# Patient Record
Sex: Female | Born: 1950
Health system: Southern US, Community
[De-identification: ages and names within clinical notes are randomized; demographics above are authoritative.]

## PROBLEM LIST (undated history)

## (undated) DIAGNOSIS — Z5189 Encounter for other specified aftercare: Secondary | ICD-10-CM

## (undated) DIAGNOSIS — Z86711 Personal history of pulmonary embolism: Secondary | ICD-10-CM

## (undated) DIAGNOSIS — I82409 Acute embolism and thrombosis of unspecified deep veins of unspecified lower extremity: Secondary | ICD-10-CM

## (undated) DIAGNOSIS — Z8719 Personal history of other diseases of the digestive system: Secondary | ICD-10-CM

## (undated) DIAGNOSIS — Z86718 Personal history of other venous thrombosis and embolism: Secondary | ICD-10-CM

## (undated) DIAGNOSIS — N189 Chronic kidney disease, unspecified: Secondary | ICD-10-CM

## (undated) DIAGNOSIS — N201 Calculus of ureter: Secondary | ICD-10-CM

## (undated) DIAGNOSIS — E785 Hyperlipidemia, unspecified: Secondary | ICD-10-CM

## (undated) DIAGNOSIS — Z85828 Personal history of other malignant neoplasm of skin: Secondary | ICD-10-CM

## (undated) DIAGNOSIS — Z86018 Personal history of other benign neoplasm: Secondary | ICD-10-CM

## (undated) DIAGNOSIS — G43909 Migraine, unspecified, not intractable, without status migrainosus: Secondary | ICD-10-CM

## (undated) DIAGNOSIS — Z9889 Other specified postprocedural states: Secondary | ICD-10-CM

## (undated) DIAGNOSIS — T7840XA Allergy, unspecified, initial encounter: Secondary | ICD-10-CM

## (undated) DIAGNOSIS — Z9989 Dependence on other enabling machines and devices: Secondary | ICD-10-CM

## (undated) DIAGNOSIS — K5730 Diverticulosis of large intestine without perforation or abscess without bleeding: Secondary | ICD-10-CM

## (undated) DIAGNOSIS — R112 Nausea with vomiting, unspecified: Secondary | ICD-10-CM

## (undated) DIAGNOSIS — Z973 Presence of spectacles and contact lenses: Secondary | ICD-10-CM

## (undated) DIAGNOSIS — C801 Malignant (primary) neoplasm, unspecified: Secondary | ICD-10-CM

## (undated) DIAGNOSIS — H269 Unspecified cataract: Secondary | ICD-10-CM

## (undated) DIAGNOSIS — G4730 Sleep apnea, unspecified: Secondary | ICD-10-CM

## (undated) DIAGNOSIS — G4733 Obstructive sleep apnea (adult) (pediatric): Secondary | ICD-10-CM

## (undated) DIAGNOSIS — M1990 Unspecified osteoarthritis, unspecified site: Secondary | ICD-10-CM

## (undated) HISTORY — DX: Malignant (primary) neoplasm, unspecified: C80.1

## (undated) HISTORY — DX: Acute embolism and thrombosis of unspecified deep veins of unspecified lower extremity: I82.409

## (undated) HISTORY — DX: Chronic kidney disease, unspecified: N18.9

## (undated) HISTORY — DX: Sleep apnea, unspecified: G47.30

## (undated) HISTORY — DX: Hyperlipidemia, unspecified: E78.5

## (undated) HISTORY — PX: COLONOSCOPY: SHX174

## (undated) HISTORY — DX: Migraine, unspecified, not intractable, without status migrainosus: G43.909

## (undated) HISTORY — DX: Unspecified cataract: H26.9

## (undated) HISTORY — DX: Encounter for other specified aftercare: Z51.89

## (undated) HISTORY — DX: Allergy, unspecified, initial encounter: T78.40XA

## (undated) HISTORY — DX: Personal history of other benign neoplasm: Z86.018

## (undated) HISTORY — PX: INCONTINENCE SURGERY: SHX676

---

## 1983-03-15 HISTORY — PX: HIP PINNING: SHX1757

## 1985-03-14 HISTORY — PX: PARTIAL HIP ARTHROPLASTY: SHX733

## 2002-03-14 HISTORY — PX: OTHER SURGICAL HISTORY: SHX169

## 2003-03-15 HISTORY — PX: VAGINAL HYSTERECTOMY: SUR661

## 2006-05-17 LAB — HM COLONOSCOPY

## 2010-01-12 HISTORY — PX: OTHER SURGICAL HISTORY: SHX169

## 2010-02-11 HISTORY — PX: INSERTION OF VENA CAVA FILTER: SHX5871

## 2010-12-06 HISTORY — PX: OTHER SURGICAL HISTORY: SHX169

## 2010-12-17 ENCOUNTER — Ambulatory Visit: Payer: Self-pay | Admitting: Family Medicine

## 2011-01-10 ENCOUNTER — Encounter: Payer: Self-pay | Admitting: Family Medicine

## 2011-01-10 ENCOUNTER — Ambulatory Visit (INDEPENDENT_AMBULATORY_CARE_PROVIDER_SITE_OTHER): Payer: BC Managed Care – PPO | Admitting: Family Medicine

## 2011-01-10 VITALS — BP 148/96 | HR 80 | Temp 98.6°F | Ht 70.0 in | Wt 210.4 lb

## 2011-01-10 DIAGNOSIS — I82409 Acute embolism and thrombosis of unspecified deep veins of unspecified lower extremity: Secondary | ICD-10-CM

## 2011-01-10 DIAGNOSIS — S2000XA Contusion of breast, unspecified breast, initial encounter: Secondary | ICD-10-CM

## 2011-01-10 DIAGNOSIS — I82403 Acute embolism and thrombosis of unspecified deep veins of lower extremity, bilateral: Secondary | ICD-10-CM | POA: Insufficient documentation

## 2011-01-10 LAB — POCT INR: INR: 3.2

## 2011-01-10 NOTE — Progress Notes (Signed)
  Subjective:    Patient ID: Barbara Thomas, female    DOB: 06-21-1950, 60 y.o.   MRN: [id number]  HPI Pt here to establish.  Pt with hx dvt and hypercoaguability and was told she was to be on coumadin for life.  Pt had a breast biopsy 9/24 on R breast and she had a lot of brusing with it and swelling around bx site.  + bleeding .  Bleeding always stops with pressure but it reoccurs.   Pt was also going to see a vascular surgeon to have filter removed.   Review of Systems    as above Objective:   Physical Exam  Constitutional: She is oriented to person, place, and time. She appears well-developed and well-nourished.  Neck: Normal range of motion. Neck supple.  Cardiovascular: Normal rate, regular rhythm and normal heart sounds.   No murmur heard. Pulmonary/Chest: Effort normal and breath sounds normal. No respiratory distress. She has no wheezes. She has no rales. She exhibits no tenderness.  Genitourinary: There is breast tenderness and bleeding.       + R breast---site of biopsy--oozing an eechymosis  Neurological: She is alert and oriented to person, place, and time.  Psychiatric: She has a normal mood and affect. Her behavior is normal. Judgment and thought content normal.          Assessment & Plan:

## 2011-01-10 NOTE — Assessment & Plan Note (Signed)
At bx site Discussed with [facility]---Dr Yolanda Bonine to see pt today

## 2011-01-10 NOTE — Assessment & Plan Note (Signed)
On coumadin InR 3.2---take 1/2 dose today and then resume Recheck  2 weeks Check f/u US Pt states she needs to see a vascular surgeon to have filter removed---will review records and get [place] first

## 2011-01-10 NOTE — Patient Instructions (Signed)
Dr Blain Pais at [facility] will see you today. Coumadin---take 1/2 tab today and then resume regular dose tomorrow---recheck PT in 2 weeks

## 2011-01-12 ENCOUNTER — Ambulatory Visit
Admission: RE | Admit: 2011-01-12 | Discharge: 2011-01-12 | Disposition: A | Discharge status: Home / Self Care | Payer: BC Managed Care – PPO | Source: Ambulatory Visit | Attending: Family Medicine | Admitting: Family Medicine

## 2011-01-12 DIAGNOSIS — I82403 Acute embolism and thrombosis of unspecified deep veins of lower extremity, bilateral: Secondary | ICD-10-CM

## 2011-01-13 ENCOUNTER — Encounter: Payer: Self-pay | Admitting: Family Medicine

## 2011-01-14 ENCOUNTER — Telehealth: Payer: Self-pay

## 2011-01-14 DIAGNOSIS — I82403 Acute embolism and thrombosis of unspecified deep veins of lower extremity, bilateral: Secondary | ICD-10-CM

## 2011-01-14 NOTE — Telephone Encounter (Signed)
Discussed with patient and made her aware of Dr.Lowne recommendations and she agreed and voiced understanding. Referral put in      [place]

## 2011-01-14 NOTE — Telephone Encounter (Signed)
Message copied by Arnette Norris on Fri Jan 14, 2011 12:02 PM ------      Message from: Lelon Perla      Created: Thu Jan 13, 2011  8:32 AM       Still with B/L DVT---on coumadin----- pt was to see vascular surgeon before moving here----- refer to vascular

## 2011-01-21 ENCOUNTER — Encounter: Payer: Self-pay | Admitting: Vascular Surgery

## 2011-01-24 ENCOUNTER — Ambulatory Visit (INDEPENDENT_AMBULATORY_CARE_PROVIDER_SITE_OTHER): Payer: BC Managed Care – PPO | Admitting: *Deleted

## 2011-01-24 VITALS — BP 110/82 | HR 97 | Wt 205.0 lb

## 2011-01-24 DIAGNOSIS — Z23 Encounter for immunization: Secondary | ICD-10-CM

## 2011-01-24 DIAGNOSIS — Z5181 Encounter for therapeutic drug level monitoring: Secondary | ICD-10-CM

## 2011-01-24 DIAGNOSIS — Z7901 Long term (current) use of anticoagulants: Secondary | ICD-10-CM

## 2011-01-24 NOTE — Patient Instructions (Addendum)
Return to office in 2 weeks for PT/INR  Continue current dosing: 1 tab daily (5mg )

## 2011-01-26 ENCOUNTER — Encounter: Payer: Self-pay | Admitting: Vascular Surgery

## 2011-02-07 ENCOUNTER — Ambulatory Visit (INDEPENDENT_AMBULATORY_CARE_PROVIDER_SITE_OTHER): Payer: BC Managed Care – PPO | Admitting: *Deleted

## 2011-02-07 DIAGNOSIS — Z7901 Long term (current) use of anticoagulants: Secondary | ICD-10-CM

## 2011-02-07 DIAGNOSIS — I82409 Acute embolism and thrombosis of unspecified deep veins of unspecified lower extremity: Secondary | ICD-10-CM

## 2011-02-07 LAB — POCT INR: INR: 3.2

## 2011-02-07 NOTE — Patient Instructions (Signed)
Return to office in 2 weeks   New dosing: 1 tab daily except 1/2 tab on M,W

## 2011-02-10 ENCOUNTER — Ambulatory Visit (INDEPENDENT_AMBULATORY_CARE_PROVIDER_SITE_OTHER): Payer: BC Managed Care – PPO | Admitting: Vascular Surgery

## 2011-02-10 ENCOUNTER — Encounter: Payer: Self-pay | Admitting: Vascular Surgery

## 2011-02-10 VITALS — BP 144/89 | HR 80 | Resp 16 | Ht 71.0 in | Wt 205.0 lb

## 2011-02-10 DIAGNOSIS — M79609 Pain in unspecified limb: Secondary | ICD-10-CM

## 2011-02-10 DIAGNOSIS — I824Z9 Acute embolism and thrombosis of unspecified deep veins of unspecified distal lower extremity: Secondary | ICD-10-CM

## 2011-02-10 NOTE — Progress Notes (Signed)
[facility]   Reason for referral: Swollen bilateral legs  History of Present Illness  Barbara Thomas is a 60 y.o. female who presents for evaluation of bilateral lower extremity DVT. She also is here to consider whether or not an IVC filter should be removed. The patient's history begins in November of 2012 4 she had a left ankle and right foot fracture. Subsequently she had a pulmonary embolus approximately one month later. She was noted at that time to have a lupus anticoagulant and placed on Coumadin. And he clips IVC filter was also placed at that time. This was all done in [place], [place]. Records were unavailable for review today. The patient continues to take her Coumadin daily and is followed by her primary care physician. She does have chronic swelling in both lower extremities but states that the leg circumference has essentially return to her baseline prior to her DVTs. She had a recent duplex ultrasound which showed DVT in both lower extremities. The images are not available for review but I would suspect this point that most likely this is chronic adherent clot. She also complains of put pain over her left fifth toe. She also has some complaints of pain in her left hip has had previous hip replacement.  The patient also complains of engorged veins in both feet as well as a bluish discoloration of her left foot. This is all chronic.  Past Medical History  Diagnosis Date  . Migraines   . DVT (deep venous thrombosis)     Past Surgical History  Procedure Date  . Breast biopsy   . Vaginal hysterectomy   . Skin cancer excision     ON NOSE  . Foot surgery   . Hip pinning   . Bladder suspension   . Vein surgery 02/2010    ECLIPSE FILTER INSERTED     History   Social History  . Marital Status: Married    Spouse Name: N/A    Number of Children: N/A  . Years of Education: N/A   Occupational History  . Not on file.   Social History Main Topics  .  Smoking status: Never Smoker   . Smokeless tobacco: Never Used  . Alcohol Use: No  . Drug Use: No  . Sexually Active: Not on file   Other Topics Concern  . Not on file   Social History Narrative  . No narrative on file    Family History  Problem Relation Age of Onset  . Transient ischemic attack Mother   . Cancer Father     BLADDER CANCER  . Hypertension Mother   . Hypertension Father   . Dementia Mother   . Stroke Paternal Grandmother   . Sudden death Maternal Grandmother   . Diabetes Brother     Current Outpatient Prescriptions on File Prior to Visit  Medication Sig Dispense Refill  . alendronate (FOSAMAX) 70 MG tablet Take 70 mg by mouth every 7 (seven) days. Take with a full glass of water on an empty stomach.       . calcium carbonate (OS-CAL) 600 MG TABS Take 600 mg by mouth 2 (two) times daily with a meal.        . fluticasone (FLONASE) 50 MCG/ACT nasal spray Place 2 sprays into the nose daily.        Marland Kitchen HYDROcodone-acetaminophen (VICODIN) 5-500 MG per tablet Take 1 tablet by mouth every 6 (six) hours as needed.        Marland Kitchen  Multiple Vitamin (MULTIVITAMIN) tablet Take 1 tablet by mouth daily.        . pantoprazole (PROTONIX) 40 MG tablet Take 40 mg by mouth daily.        . Vitamin D, Ergocalciferol, (DRISDOL) 50000 UNITS CAPS Take 50,000 Units by mouth.        . warfarin (COUMADIN) 5 MG tablet Take 5 mg by mouth daily.          Allergies as of 02/10/2011 - Review Complete 02/10/2011  Allergen Reaction Noted  . Penicillins Rash 01/10/2011     ROS:   General:  No weight loss, Fever, chills  HEENT: No recent headaches, no nasal bleeding, no visual changes, no sore throat  Neurologic: No dizziness, blackouts, seizures. No recent symptoms of stroke or mini- stroke. No recent episodes of slurred speech, or temporary blindness.  Cardiac: No recent episodes of chest pain/pressure, no shortness of breath at rest.  No shortness of breath with exertion.  Denies history of  atrial fibrillation or irregular heartbeat  Vascular: No history of rest pain in feet.  No history of claudication.  No history of non-healing ulcer  Pulmonary: No home oxygen, no productive cough, no hemoptysis,  No asthma or wheezing  Musculoskeletal:  ,  [ ]  Joint pain as above  Hematologic:history of hypercoagulable state  Gastrointestinal: No hematochezia or melena,  No gastroesophageal reflux, no trouble swallowing  Urinary: [ ]  chronic Kidney disease, [ ]  on HD - [ ]  MWF or [ ]  TTHS, [ ]  Burning with urination, [ ]  Frequent urination, [ ]  Difficulty urinating;   Skin: No rashes  Psychological: No history of anxiety,  No history of depression  Physical Examination  Filed Vitals:   02/10/11 1026  BP: 144/89  Pulse: 80  Resp: 16  Height: 5\' 11"  (1.803 m)  Weight: 205 lb (92.987 kg)  SpO2: 100%    Body mass index is 28.59 kg/(m^2).  General:  Alert and oriented, no acute distress HEENT: Normal Neck: No bruit or JVD Pulmonary: Clear to auscultation bilaterally Cardiac: Regular Rate and Rhythm without murmur Abdomen: Soft, non-tender, non-distended, no mass, no scars, obese Skin: No rash, dusky to bluish discoloration of left foot from the ankle down. Large varicosities both feet 3-4 mm in diameter, no ulcers Extremity Pulses:  2+ radial, brachial, femoral, dorsalis pedis, posterior tibial pulses bilaterally Musculoskeletal: No deformity or edema, tender to palpation over the left fifth metatarsal head Neurologic: Upper and lower extremity motor 5/5 and symmetric   Assessment: Patient with a history of lupus anticoagulant now on chronic Coumadin therapy. She also has a history of IVC filter placement. She has symptoms now of postphlebitic syndrome.  She also has pain in her left foot and left hip which is probably orthopedic in nature. No evidence of arterial disease.  Plan: #1 the patient will continue her Coumadin therapy lifelong  #2 I would not recommend  removing her inferior vena cava filter should she does still have evidence of thrombus in her veins. However, this most likely represents chronic adherent thrombus and should be low risk overall. However I do not believe the benefits of removing the filter out ways the risk of removing the filter as well as the protective effect of the filter in her risk long term of recurrent DVT. All this was discussed with the patient today.  #3 the patient currently wears compression stockings and have continued to advise her that she should wear these lifelong and replace them when they  become loose  #4 we will obtain a left foot x-ray in left hip x-ray to evaluate her hip and foot pain. I've also referred her to Dr. Allie Bossier for further evaluation and long-term followup of her orthopedic situation since she has now moved here from [place], [place], MD [facility] Office: [phone] Pager: [phone]

## 2011-02-22 ENCOUNTER — Ambulatory Visit (INDEPENDENT_AMBULATORY_CARE_PROVIDER_SITE_OTHER): Payer: BC Managed Care – PPO | Admitting: *Deleted

## 2011-02-22 ENCOUNTER — Telehealth: Payer: Self-pay | Admitting: *Deleted

## 2011-02-22 DIAGNOSIS — Z7901 Long term (current) use of anticoagulants: Secondary | ICD-10-CM

## 2011-02-22 DIAGNOSIS — I82409 Acute embolism and thrombosis of unspecified deep veins of unspecified lower extremity: Secondary | ICD-10-CM

## 2011-02-22 MED ORDER — WARFARIN SODIUM 5 MG PO TABS: 5.0000 mg | ORAL_TABLET | Freq: Every day | ORAL | Status: DC

## 2011-02-22 NOTE — Telephone Encounter (Signed)
Discussed with patient and she just wanted to go in an have her labs done. I explained the est way ans most accurate to do it is in the office because outside of the office will put her at an increased risk if the results don't came back in time and if they are abnormal we needs to address it asap. Patient voiced understanding and stated she would continue to come in or her PT/INR checks.      KP

## 2011-02-22 NOTE — Telephone Encounter (Signed)
Yes ---she just needs to call and establish care there---they are [facility] but not [facility]--- so she would be a new patient.

## 2011-02-22 NOTE — Telephone Encounter (Signed)
Pt came in for a PT/INR check, she advised that is far for her to drive here is there anyway she can go to the [place] office?

## 2011-02-22 NOTE — Patient Instructions (Signed)
PT/INR today 3.1 Take 2.5MG  today Start taking 5MG  everyday except on Tuesdays Re-check in 2 weeks

## 2011-02-22 NOTE — Progress Notes (Signed)
PT/INR 3.1 Changed to take 2.5mg  today and every Tuesday Take 5mg  every day except Tuesday Re-check in 2 weeks

## 2011-02-25 ENCOUNTER — Encounter: Payer: Self-pay | Admitting: Family Medicine

## 2011-03-09 ENCOUNTER — Ambulatory Visit (INDEPENDENT_AMBULATORY_CARE_PROVIDER_SITE_OTHER): Payer: BC Managed Care – PPO | Admitting: *Deleted

## 2011-03-09 DIAGNOSIS — Z5181 Encounter for therapeutic drug level monitoring: Secondary | ICD-10-CM

## 2011-03-09 DIAGNOSIS — Z7901 Long term (current) use of anticoagulants: Secondary | ICD-10-CM

## 2011-03-09 LAB — POCT INR: INR: 1.8

## 2011-03-09 NOTE — Patient Instructions (Addendum)
No changes made due to missed dose on 12.24.12. On tuesdays take 2.5mg  [0.5 tablet], every day except Tuesday take 5mg  [1 tablet]. Repeat INR/PT check in [2] weeks per Dr. Beverely Low.

## 2011-03-23 ENCOUNTER — Ambulatory Visit (INDEPENDENT_AMBULATORY_CARE_PROVIDER_SITE_OTHER): Payer: BC Managed Care – PPO

## 2011-03-23 VITALS — BP 118/70 | HR 97 | Temp 97.7°F

## 2011-03-23 DIAGNOSIS — Z7901 Long term (current) use of anticoagulants: Secondary | ICD-10-CM

## 2011-03-23 LAB — POCT INR: INR: 2.6

## 2011-03-23 NOTE — Patient Instructions (Signed)
No change, continue to take 5 mg (1 tab) daily except on tuesdays take 2.5 mg (half tab).   KP

## 2011-03-30 ENCOUNTER — Telehealth: Payer: Self-pay | Admitting: Family Medicine

## 2011-03-30 MED ORDER — ALENDRONATE SODIUM 70 MG PO TABS: 70.0000 mg | ORAL_TABLET | ORAL | Status: DC

## 2011-03-30 NOTE — Telephone Encounter (Signed)
Rx sent 

## 2011-03-30 NOTE — Telephone Encounter (Signed)
The pt called and is requesting a refill of Fosamax 70mg  sent to [facility]!

## 2011-04-06 ENCOUNTER — Ambulatory Visit (INDEPENDENT_AMBULATORY_CARE_PROVIDER_SITE_OTHER): Payer: BC Managed Care – PPO | Admitting: *Deleted

## 2011-04-06 DIAGNOSIS — I82409 Acute embolism and thrombosis of unspecified deep veins of unspecified lower extremity: Secondary | ICD-10-CM

## 2011-04-06 DIAGNOSIS — Z7901 Long term (current) use of anticoagulants: Secondary | ICD-10-CM

## 2011-04-06 NOTE — Patient Instructions (Signed)
Return to office in 4 weeks  Continue current dose:  5 mg (1 tab) daily except tuesdays 2.5 mg (1/2 tab). 

## 2011-04-25 ENCOUNTER — Other Ambulatory Visit: Payer: Self-pay | Admitting: Family Medicine

## 2011-04-27 ENCOUNTER — Other Ambulatory Visit: Payer: Self-pay | Admitting: Family Medicine

## 2011-04-27 MED ORDER — VITAMIN D (ERGOCALCIFEROL) 1.25 MG (50000 UNIT) PO CAPS: 50000.0000 [IU] | ORAL_CAPSULE | ORAL | Status: DC

## 2011-04-27 NOTE — Telephone Encounter (Signed)
cpe--scheduled 06/03/11--   KP

## 2011-05-09 ENCOUNTER — Ambulatory Visit (INDEPENDENT_AMBULATORY_CARE_PROVIDER_SITE_OTHER): Payer: BC Managed Care – PPO | Admitting: *Deleted

## 2011-05-09 DIAGNOSIS — Z7901 Long term (current) use of anticoagulants: Secondary | ICD-10-CM

## 2011-05-09 DIAGNOSIS — I82409 Acute embolism and thrombosis of unspecified deep veins of unspecified lower extremity: Secondary | ICD-10-CM

## 2011-05-09 NOTE — Patient Instructions (Signed)
Return to office in 4 weeks  Continue current dose:  5 mg (1 tab) daily except tuesdays 2.5 mg (1/2 tab).

## 2011-05-23 ENCOUNTER — Other Ambulatory Visit: Payer: Self-pay | Admitting: Family Medicine

## 2011-06-03 ENCOUNTER — Ambulatory Visit (INDEPENDENT_AMBULATORY_CARE_PROVIDER_SITE_OTHER): Payer: BC Managed Care – PPO | Admitting: Family Medicine

## 2011-06-03 ENCOUNTER — Encounter: Payer: Self-pay | Admitting: Family Medicine

## 2011-06-03 VITALS — BP 120/72 | HR 91 | Temp 97.9°F | Ht 70.25 in | Wt 209.4 lb

## 2011-06-03 DIAGNOSIS — Z780 Asymptomatic menopausal state: Secondary | ICD-10-CM

## 2011-06-03 DIAGNOSIS — I82409 Acute embolism and thrombosis of unspecified deep veins of unspecified lower extremity: Secondary | ICD-10-CM

## 2011-06-03 DIAGNOSIS — J40 Bronchitis, not specified as acute or chronic: Secondary | ICD-10-CM

## 2011-06-03 DIAGNOSIS — K219 Gastro-esophageal reflux disease without esophagitis: Secondary | ICD-10-CM

## 2011-06-03 DIAGNOSIS — Z0000 Encounter for general adult medical examination without abnormal findings: Secondary | ICD-10-CM

## 2011-06-03 DIAGNOSIS — R319 Hematuria, unspecified: Secondary | ICD-10-CM

## 2011-06-03 LAB — LIPID PANEL
Cholesterol: 226 mg/dL — ABNORMAL HIGH (ref 0–200)
HDL: 52.8 mg/dL (ref >39.00)
Triglycerides: 99 mg/dL (ref 0.0–149.0)
VLDL: 19.8 mg/dL (ref 0.0–40.0)

## 2011-06-03 LAB — BASIC METABOLIC PANEL
BUN: 18 mg/dL (ref 6–23)
Calcium: 9.2 mg/dL (ref 8.4–10.5)
Creatinine, Ser: 0.8 mg/dL (ref 0.4–1.2)
GFR: 76.77 mL/min (ref >60.00)
Glucose, Bld: 113 mg/dL — ABNORMAL HIGH (ref 70–99)
Sodium: 138 mEq/L (ref 135–145)

## 2011-06-03 LAB — TSH: TSH: 1.25 u[IU]/mL (ref 0.35–5.50)

## 2011-06-03 LAB — CBC WITH DIFFERENTIAL/PLATELET
Eosinophils Absolute: 0.1 10*3/uL (ref 0.0–0.7)
HCT: 41.8 % (ref 36.0–46.0)
Lymphs Abs: 1.4 10*3/uL (ref 0.7–4.0)
MCHC: 34 g/dL (ref 30.0–36.0)
MCV: 84.7 fl (ref 78.0–100.0)
Monocytes Absolute: 0.6 10*3/uL (ref 0.1–1.0)
Neutrophils Relative %: 64.9 % (ref 43.0–77.0)
Platelets: 206 10*3/uL (ref 150.0–400.0)

## 2011-06-03 LAB — POCT URINALYSIS DIPSTICK
Protein, UA: NEGATIVE
Spec Grav, UA: >=1.03
Urobilinogen, UA: 0.2
pH, UA: 6

## 2011-06-03 LAB — LDL CHOLESTEROL, DIRECT: Direct LDL: 163.8 mg/dL

## 2011-06-03 LAB — HEPATIC FUNCTION PANEL
Albumin: 4.3 g/dL (ref 3.5–5.2)
Alkaline Phosphatase: 84 U/L (ref 39–117)

## 2011-06-03 LAB — POCT INR: INR: 2.3

## 2011-06-03 MED ORDER — AZITHROMYCIN 250 MG PO TABS
ORAL_TABLET | ORAL | Status: AC
Start: 2011-06-03 — End: 2011-06-08

## 2011-06-03 MED ORDER — FLUTICASONE-SALMETEROL 250-50 MCG/DOSE IN AEPB: INHALATION_SPRAY | RESPIRATORY_TRACT | Status: DC

## 2011-06-03 NOTE — Progress Notes (Signed)
Subjective:     Barbara Thomas is a 61 y.o. female and is here for a comprehensive physical exam. The patient reports problems - + congestion for 4-5 days.  History   Social History  . Marital Status: Married    Spouse Name: N/A    Number of Children: N/A  . Years of Education: N/A   Occupational History  . housewife    Social History Main Topics  . Smoking status: Never Smoker   . Smokeless tobacco: Never Used  . Alcohol Use: No  . Drug Use: No  . Sexually Active: Yes -- Female partner(s)   Other Topics Concern  . Not on file   Social History Narrative   Exercise--- walking--- qd short distance   Health Maintenance  Topic Date Due  . Tetanus/tdap  11/08/1970  . Mammogram  11/07/2001  . Influenza Vaccine  12/13/2011  . Colonoscopy  06/02/2013  . Pap Smear  06/03/2014    The following portions of the patient's history were reviewed and updated as appropriate: allergies, current medications, past family history, past medical history, past social history, past surgical history and problem list.  Review of Systems Review of Systems  Constitutional: Negative for activity change, appetite change and fatigue.  HENT: Negative for hearing loss, , tinnitus and ear discharge.  dentist-- due  + congestion Eyes: Negative for visual disturbance (see optho q2y -- vision corrected to 20/20 with glasses).  Respiratory: Negative for cough, chest tightness and shortness of breath.   Cardiovascular: Negative for chest pain, palpitations and leg swelling.  Gastrointestinal: Negative for abdominal pain, diarrhea, constipation and abdominal distention.  Genitourinary: Negative for urgency, frequency, decreased urine volume and difficulty urinating.  Musculoskeletal: Negative for back pain, arthralgias and gait problem.  Skin: Negative for color change, pallor and rash.  Neurological: Negative for dizziness, light-headedness, numbness and headaches.  Hematological: Negative for adenopathy. Does  not bruise/bleed easily.  Psychiatric/Behavioral: Negative for suicidal ideas, confusion, sleep disturbance, self-injury, dysphoric mood, decreased concentration and agitation.       Objective:    BP 120/72  Pulse 91  Temp(Src) 97.9 F (36.6 C) (Oral)  Ht 5' 10.25" (1.784 m)  Wt 209 lb 6.4 oz (94.983 kg)  BMI 29.83 kg/m2  SpO2 95% General appearance: alert, cooperative, appears stated age and no distress Head: Normocephalic, without obvious abnormality, atraumatic Eyes: negative findings: lids and lashes normal, conjunctivae/corneas clear. PERRL, EOM&#39;s intact. Fundi benign. Ears: normal TM's and external ear canals both ears Nose: Nares normal. Septum midline. Mucosa normal. No drainage or sinus tenderness. Throat: lips, mucosa, and tongue normal; teeth and gums normal Neck: no adenopathy, no carotid bruit, supple, symmetrical, trachea midline and thyroid not enlarged, symmetric, no tenderness/mass/nodules Back: no skin lesions, erythema, or scars, symmetric, no curvature. ROM normal. No CVA tenderness. Lungs: clear to auscultation bilaterally Breasts: normal appearance, no masses or tenderness Heart: regular rate and rhythm, S1, S2 normal, no murmur, click, rub or gallop Abdomen: soft, non-tender; bowel sounds normal; no masses,  no organomegaly Pelvic: not indicated; post-menopausal, no abnormal Pap smears in past Extremities: extremities normal, atraumatic, no cyanosis or edema Pulses: 2+ and symmetric Skin: Skin color, texture, turgor normal. No rashes or lesions Lymph nodes: Cervical, supraclavicular, and axillary nodes normal. Neurologic: Alert and oriented X 3, normal strength and tone. Normal symmetric reflexes. Normal coordination and gait     Assessment:    Healthy female exam.  GERD   DVT-- on coumadin Bronchitis---z pack,  advair 250 1 inh bid,  con't tussonex and antihistamine   Plan:    ghm utd Check labs Check mammo/ bmd See After Visit Summary for  Counseling Recommendations

## 2011-06-03 NOTE — Patient Instructions (Signed)
Preventive Care for Adults, Female A healthy lifestyle and preventive care can promote health and wellness. Preventive health guidelines for women include the following key practices.  A routine yearly physical is a good way to check with your caregiver about your health and preventive screening. It is a chance to share any concerns and updates on your health, and to receive a thorough exam.   Visit your dentist for a routine exam and preventive care every 6 months. Brush your teeth twice a day and floss once a day. Good oral hygiene prevents tooth decay and gum disease.   The frequency of eye exams is based on your age, health, family medical history, use of contact lenses, and other factors. Follow your caregiver's recommendations for frequency of eye exams.   Eat a healthy diet. Foods like vegetables, fruits, whole grains, low-fat dairy products, and lean protein foods contain the nutrients you need without too many calories. Decrease your intake of foods high in solid fats, added sugars, and salt. Eat the right amount of calories for you.Get information about a proper diet from your caregiver, if necessary.   Regular physical exercise is one of the most important things you can do for your health. Most adults should get at least 150 minutes of moderate-intensity exercise (any activity that increases your heart rate and causes you to sweat) each week. In addition, most adults need muscle-strengthening exercises on 2 or more days a week.   Maintain a healthy weight. The body mass index (BMI) is a screening tool to identify possible weight problems. It provides an estimate of body fat based on height and weight. Your caregiver can help determine your BMI, and can help you achieve or maintain a healthy weight.For adults 20 years and older:   A BMI below 18.5 is considered underweight.   A BMI of 18.5 to 24.9 is normal.   A BMI of 25 to 29.9 is considered overweight.   A BMI of 30 and above is  considered obese.   Maintain normal blood lipids and cholesterol levels by exercising and minimizing your intake of saturated fat. Eat a balanced diet with plenty of fruit and vegetables. Blood tests for lipids and cholesterol should begin at age 20 and be repeated every 5 years. If your lipid or cholesterol levels are high, you are over 50, or you are at high risk for heart disease, you may need your cholesterol levels checked more frequently.Ongoing high lipid and cholesterol levels should be treated with medicines if diet and exercise are not effective.   If you smoke, find out from your caregiver how to quit. If you do not use tobacco, do not start.   If you are pregnant, do not drink alcohol. If you are breastfeeding, be very cautious about drinking alcohol. If you are not pregnant and choose to drink alcohol, do not exceed 1 drink per day. One drink is considered to be 12 ounces (355 mL) of beer, 5 ounces (148 mL) of wine, or 1.5 ounces (44 mL) of liquor.   Avoid use of street drugs. Do not share needles with anyone. Ask for help if you need support or instructions about stopping the use of drugs.   High blood pressure causes heart disease and increases the risk of stroke. Your blood pressure should be checked at least every 1 to 2 years. Ongoing high blood pressure should be treated with medicines if weight loss and exercise are not effective.   If you are 55 to 60   years old, ask your caregiver if you should take aspirin to prevent strokes.   Diabetes screening involves taking a blood sample to check your fasting blood sugar level. This should be done once every 3 years, after age 45, if you are within normal weight and without risk factors for diabetes. Testing should be considered at a younger age or be carried out more frequently if you are overweight and have at least 1 risk factor for diabetes.   Breast cancer screening is essential preventive care for women. You should practice "breast  self-awareness." This means understanding the normal appearance and feel of your breasts and may include breast self-examination. Any changes detected, no matter how small, should be reported to a caregiver. Women in their 20s and 30s should have a clinical breast exam (CBE) by a caregiver as part of a regular health exam every 1 to 3 years. After age 40, women should have a CBE every year. Starting at age 40, women should consider having a mammography (breast X-ray test) every year. Women who have a family history of breast cancer should talk to their caregiver about genetic screening. Women at a high risk of breast cancer should talk to their caregivers about having magnetic resonance imaging (MRI) and a mammography every year.   The Pap test is a screening test for cervical cancer. A Pap test can show cell changes on the cervix that might become cervical cancer if left untreated. A Pap test is a procedure in which cells are obtained and examined from the lower end of the uterus (cervix).   Women should have a Pap test starting at age 21.   Between ages 21 and 29, Pap tests should be repeated every 2 years.   Beginning at age 30, you should have a Pap test every 3 years as long as the past 3 Pap tests have been normal.   Some women have medical problems that increase the chance of getting cervical cancer. Talk to your caregiver about these problems. It is especially important to talk to your caregiver if a new problem develops soon after your last Pap test. In these cases, your caregiver may recommend more frequent screening and Pap tests.   The above recommendations are the same for women who have or have not gotten the vaccine for human papillomavirus (HPV).   If you had a hysterectomy for a problem that was not cancer or a condition that could lead to cancer, then you no longer need Pap tests. Even if you no longer need a Pap test, a regular exam is a good idea to make sure no other problems are  starting.   If you are between ages 65 and 70, and you have had normal Pap tests going back 10 years, you no longer need Pap tests. Even if you no longer need a Pap test, a regular exam is a good idea to make sure no other problems are starting.   If you have had past treatment for cervical cancer or a condition that could lead to cancer, you need Pap tests and screening for cancer for at least 20 years after your treatment.   If Pap tests have been discontinued, risk factors (such as a new sexual partner) need to be reassessed to determine if screening should be resumed.   The HPV test is an additional test that may be used for cervical cancer screening. The HPV test looks for the virus that can cause the cell changes on the cervix.   The cells collected during the Pap test can be tested for HPV. The HPV test could be used to screen women aged 30 years and older, and should be used in women of any age who have unclear Pap test results. After the age of 30, women should have HPV testing at the same frequency as a Pap test.   Colorectal cancer can be detected and often prevented. Most routine colorectal cancer screening begins at the age of 50 and continues through age 75. However, your caregiver may recommend screening at an earlier age if you have risk factors for colon cancer. On a yearly basis, your caregiver may provide home test kits to check for hidden blood in the stool. Use of a small camera at the end of a tube, to directly examine the colon (sigmoidoscopy or colonoscopy), can detect the earliest forms of colorectal cancer. Talk to your caregiver about this at age 50, when routine screening begins. Direct examination of the colon should be repeated every 5 to 10 years through age 75, unless early forms of pre-cancerous polyps or small growths are found.   Hepatitis C blood testing is recommended for all people born from 1945 through 1965 and any individual with known risks for hepatitis C.    Practice safe sex. Use condoms and avoid high-risk sexual practices to reduce the spread of sexually transmitted infections (STIs). STIs include gonorrhea, chlamydia, syphilis, trichomonas, herpes, HPV, and human immunodeficiency virus (HIV). Herpes, HIV, and HPV are viral illnesses that have no cure. They can result in disability, cancer, and death. Sexually active women aged 25 and younger should be checked for chlamydia. Older women with new or multiple partners should also be tested for chlamydia. Testing for other STIs is recommended if you are sexually active and at increased risk.   Osteoporosis is a disease in which the bones lose minerals and strength with aging. This can result in serious bone fractures. The risk of osteoporosis can be identified using a bone density scan. Women ages 65 and over and women at risk for fractures or osteoporosis should discuss screening with their caregivers. Ask your caregiver whether you should take a calcium supplement or vitamin D to reduce the rate of osteoporosis.   Menopause can be associated with physical symptoms and risks. Hormone replacement therapy is available to decrease symptoms and risks. You should talk to your caregiver about whether hormone replacement therapy is right for you.   Use sunscreen with sun protection factor (SPF) of 30 or more. Apply sunscreen liberally and repeatedly throughout the day. You should seek shade when your shadow is shorter than you. Protect yourself by wearing long sleeves, pants, a wide-brimmed hat, and sunglasses year round, whenever you are outdoors.   Once a month, do a whole body skin exam, using a mirror to look at the skin on your back. Notify your caregiver of new moles, moles that have irregular borders, moles that are larger than a pencil eraser, or moles that have changed in shape or color.   Stay current with required immunizations.   Influenza. You need a dose every fall (or winter). The composition of  the flu vaccine changes each year, so being vaccinated once is not enough.   Pneumococcal polysaccharide. You need 1 to 2 doses if you smoke cigarettes or if you have certain chronic medical conditions. You need 1 dose at age 65 (or older) if you have never been vaccinated.   Tetanus, diphtheria, pertussis (Tdap, Td). Get 1 dose of   Tdap vaccine if you are younger than age 65, are over 65 and have contact with an infant, are a healthcare worker, are pregnant, or simply want to be protected from whooping cough. After that, you need a Td booster dose every 10 years. Consult your caregiver if you have not had at least 3 tetanus and diphtheria-containing shots sometime in your life or have a deep or dirty wound.   HPV. You need this vaccine if you are a woman age 26 or younger. The vaccine is given in 3 doses over 6 months.   Measles, mumps, rubella (MMR). You need at least 1 dose of MMR if you were born in 1957 or later. You may also need a second dose.   Meningococcal. If you are age 19 to 21 and a first-year college student living in a residence hall, or have one of several medical conditions, you need to get vaccinated against meningococcal disease. You may also need additional booster doses.   Zoster (shingles). If you are age 60 or older, you should get this vaccine.   Varicella (chickenpox). If you have never had chickenpox or you were vaccinated but received only 1 dose, talk to your caregiver to find out if you need this vaccine.   Hepatitis A. You need this vaccine if you have a specific risk factor for hepatitis A virus infection or you simply wish to be protected from this disease. The vaccine is usually given as 2 doses, 6 to 18 months apart.   Hepatitis B. You need this vaccine if you have a specific risk factor for hepatitis B virus infection or you simply wish to be protected from this disease. The vaccine is given in 3 doses, usually over 6 months.  Preventive Services /  Frequency Ages 19 to 39  Blood pressure check.** / Every 1 to 2 years.   Lipid and cholesterol check.** / Every 5 years beginning at age 20.   Clinical breast exam.** / Every 3 years for women in their 20s and 30s.   Pap test.** / Every 2 years from ages 21 through 29. Every 3 years starting at age 30 through age 65 or 70 with a history of 3 consecutive normal Pap tests.   HPV screening.** / Every 3 years from ages 30 through ages 65 to 70 with a history of 3 consecutive normal Pap tests.   Hepatitis C blood test.** / For any individual with known risks for hepatitis C.   Skin self-exam. / Monthly.   Influenza immunization.** / Every year.   Pneumococcal polysaccharide immunization.** / 1 to 2 doses if you smoke cigarettes or if you have certain chronic medical conditions.   Tetanus, diphtheria, pertussis (Tdap, Td) immunization. / A one-time dose of Tdap vaccine. After that, you need a Td booster dose every 10 years.   HPV immunization. / 3 doses over 6 months, if you are 26 and younger.   Measles, mumps, rubella (MMR) immunization. / You need at least 1 dose of MMR if you were born in 1957 or later. You may also need a second dose.   Meningococcal immunization. / 1 dose if you are age 19 to 21 and a first-year college student living in a residence hall, or have one of several medical conditions, you need to get vaccinated against meningococcal disease. You may also need additional booster doses.   Varicella immunization.** / Consult your caregiver.   Hepatitis A immunization.** / Consult your caregiver. 2 doses, 6 to 18 months   apart.   Hepatitis B immunization.** / Consult your caregiver. 3 doses usually over 6 months.  Ages 40 to 64  Blood pressure check.** / Every 1 to 2 years.   Lipid and cholesterol check.** / Every 5 years beginning at age 20.   Clinical breast exam.** / Every year after age 40.   Mammogram.** / Every year beginning at age 40 and continuing for as  long as you are in good health. Consult with your caregiver.   Pap test.** / Every 3 years starting at age 30 through age 65 or 70 with a history of 3 consecutive normal Pap tests.   HPV screening.** / Every 3 years from ages 30 through ages 65 to 70 with a history of 3 consecutive normal Pap tests.   Fecal occult blood test (FOBT) of stool. / Every year beginning at age 50 and continuing until age 75. You may not need to do this test if you get a colonoscopy every 10 years.   Flexible sigmoidoscopy or colonoscopy.** / Every 5 years for a flexible sigmoidoscopy or every 10 years for a colonoscopy beginning at age 50 and continuing until age 75.   Hepatitis C blood test.** / For all people born from 1945 through 1965 and any individual with known risks for hepatitis C.   Skin self-exam. / Monthly.   Influenza immunization.** / Every year.   Pneumococcal polysaccharide immunization.** / 1 to 2 doses if you smoke cigarettes or if you have certain chronic medical conditions.   Tetanus, diphtheria, pertussis (Tdap, Td) immunization.** / A one-time dose of Tdap vaccine. After that, you need a Td booster dose every 10 years.   Measles, mumps, rubella (MMR) immunization. / You need at least 1 dose of MMR if you were born in 1957 or later. You may also need a second dose.   Varicella immunization.** / Consult your caregiver.   Meningococcal immunization.** / Consult your caregiver.   Hepatitis A immunization.** / Consult your caregiver. 2 doses, 6 to 18 months apart.   Hepatitis B immunization.** / Consult your caregiver. 3 doses, usually over 6 months.  Ages 65 and over  Blood pressure check.** / Every 1 to 2 years.   Lipid and cholesterol check.** / Every 5 years beginning at age 20.   Clinical breast exam.** / Every year after age 40.   Mammogram.** / Every year beginning at age 40 and continuing for as long as you are in good health. Consult with your caregiver.   Pap test.** /  Every 3 years starting at age 30 through age 65 or 70 with a 3 consecutive normal Pap tests. Testing can be stopped between 65 and 70 with 3 consecutive normal Pap tests and no abnormal Pap or HPV tests in the past 10 years.   HPV screening.** / Every 3 years from ages 30 through ages 65 or 70 with a history of 3 consecutive normal Pap tests. Testing can be stopped between 65 and 70 with 3 consecutive normal Pap tests and no abnormal Pap or HPV tests in the past 10 years.   Fecal occult blood test (FOBT) of stool. / Every year beginning at age 50 and continuing until age 75. You may not need to do this test if you get a colonoscopy every 10 years.   Flexible sigmoidoscopy or colonoscopy.** / Every 5 years for a flexible sigmoidoscopy or every 10 years for a colonoscopy beginning at age 50 and continuing until age 75.   Hepatitis   C blood test.** / For all people born from 1945 through 1965 and any individual with known risks for hepatitis C.   Osteoporosis screening.** / A one-time screening for women ages 65 and over and women at risk for fractures or osteoporosis.   Skin self-exam. / Monthly.   Influenza immunization.** / Every year.   Pneumococcal polysaccharide immunization.** / 1 dose at age 65 (or older) if you have never been vaccinated.   Tetanus, diphtheria, pertussis (Tdap, Td) immunization. / A one-time dose of Tdap vaccine if you are over 65 and have contact with an infant, are a healthcare worker, or simply want to be protected from whooping cough. After that, you need a Td booster dose every 10 years.   Varicella immunization.** / Consult your caregiver.   Meningococcal immunization.** / Consult your caregiver.   Hepatitis A immunization.** / Consult your caregiver. 2 doses, 6 to 18 months apart.   Hepatitis B immunization.** / Check with your caregiver. 3 doses, usually over 6 months.  ** Family history and personal history of risk and conditions may change your caregiver's  recommendations. Document Released: 04/26/2001 Document Revised: 02/17/2011 Document Reviewed: 07/26/2010 [employer] Patient Information 2012 [employer]. 

## 2011-06-04 LAB — HM DEXA SCAN

## 2011-06-06 MED ORDER — CIPROFLOXACIN HCL 500 MG PO TABS: 500.0000 mg | ORAL_TABLET | Freq: Two times a day (BID) | ORAL | Status: AC

## 2011-06-06 NOTE — Progress Notes (Signed)
Addended by: Derry Lory A on: 06/06/2011 05:18 PM   Modules accepted: Orders

## 2011-06-09 ENCOUNTER — Telehealth: Payer: Self-pay | Admitting: Family Medicine

## 2011-06-09 ENCOUNTER — Ambulatory Visit (HOSPITAL_BASED_OUTPATIENT_CLINIC_OR_DEPARTMENT_OTHER)
Admission: RE | Admit: 2011-06-09 | Discharge: 2011-06-09 | Disposition: A | Discharge status: Home / Self Care | Payer: BC Managed Care – PPO | Source: Ambulatory Visit | Attending: Internal Medicine | Admitting: Internal Medicine

## 2011-06-09 ENCOUNTER — Ambulatory Visit (INDEPENDENT_AMBULATORY_CARE_PROVIDER_SITE_OTHER): Payer: BC Managed Care – PPO | Admitting: Internal Medicine

## 2011-06-09 VITALS — BP 140/100 | HR 104 | Temp 98.8°F | Wt 208.0 lb

## 2011-06-09 DIAGNOSIS — IMO0001 Reserved for inherently not codable concepts without codable children: Secondary | ICD-10-CM

## 2011-06-09 DIAGNOSIS — J209 Acute bronchitis, unspecified: Secondary | ICD-10-CM

## 2011-06-09 DIAGNOSIS — R062 Wheezing: Secondary | ICD-10-CM | POA: Insufficient documentation

## 2011-06-09 DIAGNOSIS — R059 Cough, unspecified: Secondary | ICD-10-CM | POA: Insufficient documentation

## 2011-06-09 DIAGNOSIS — I2699 Other pulmonary embolism without acute cor pulmonale: Secondary | ICD-10-CM

## 2011-06-09 DIAGNOSIS — M549 Dorsalgia, unspecified: Secondary | ICD-10-CM

## 2011-06-09 DIAGNOSIS — M545 Low back pain, unspecified: Secondary | ICD-10-CM

## 2011-06-09 DIAGNOSIS — R937 Abnormal findings on diagnostic imaging of other parts of musculoskeletal system: Secondary | ICD-10-CM

## 2011-06-09 DIAGNOSIS — R918 Other nonspecific abnormal finding of lung field: Secondary | ICD-10-CM

## 2011-06-09 DIAGNOSIS — R05 Cough: Secondary | ICD-10-CM

## 2011-06-09 MED ORDER — CYCLOBENZAPRINE HCL 5 MG PO TABS: ORAL_TABLET | ORAL | Status: DC

## 2011-06-09 MED ORDER — HYDROCODONE-ACETAMINOPHEN 7.5-500 MG PO TABS: 1.0000 | ORAL_TABLET | ORAL | Status: AC | PRN

## 2011-06-09 MED ORDER — SULFAMETHOXAZOLE-TRIMETHOPRIM 800-160 MG PO TABS: 1.0000 | ORAL_TABLET | Freq: Two times a day (BID) | ORAL | Status: AC

## 2011-06-09 MED ORDER — PREDNISONE 20 MG PO TABS: 20.0000 mg | ORAL_TABLET | Freq: Two times a day (BID) | ORAL | Status: AC

## 2011-06-09 NOTE — Progress Notes (Signed)
Subjective:    Patient ID: Barbara Thomas, female    DOB: Nov 27, 1950, 61 y.o.   MRN: [id number]  HPI Respiratory tract infection Onset/symptoms:12 days ago as rhinitis Exposures (illness/environmental/extrinsic):no Progression of symptoms:to chest congestion & wheezing Treatments/response:Zpack Rxed 3/21, Flonase, & Mucinex with minimal benefit Present symptoms: Fever/chills/sweats:no Frontal headache:no Facial pain:no Nasal purulence:yellow Sore throat:no Dental pain:no Lymphadenopathy:no Wheezing/shortness of breath:still wheezing Cough/sputum/hemoptysis:minimally now Pleuritic pain:no Associated extrinsic/allergic symptoms:itchy eyes/ sneezing:no Past medical history: Seasonal allergies: yes/asthma:no. PMH of DVT and pulmonary emboli following severe fracture of the left ankle. Status post inferior vena cava  filter placement and chronic Coumadin. PT/INR was 2.3 on 3/21 Smoking history:never           Review of Systems BACK PAIN: Onset: 3/24 after coughing Location: R LS area  Quality: sharp  Worse with: cough  Better with: standing & sitting  Radiation: to mid back Trauma: no Red Flags Fecal/urinary incontinence:no Numbness/Weakness:no  Night pain:worse supine PMH of osteoporosis ;no chronic steroid use  She is on Cipro for urinary tract infection was found incidentally 06/02/11 at her physical. She denies hematuria, pyuria, or dysuria    Objective:   Physical Exam Gen.: Healthy and well-nourished in appearance ,but uncomfortable. Alert, appropriate and cooperative throughout exam.  Eyes: No corneal or conjunctival inflammation noted.  Ears: External  ear exam reveals no significant lesions or deformities. Canals clear .TMs normal. Hearing is grossly normal bilaterally. Nose: External nasal exam reveals no deformity or inflammation. Nasal mucosa are pink and moist. No lesions or exudates noted.   Mouth: Oral mucosa and oropharynx reveal no lesions or exudates.  Teeth in good repair. Neck: No deformities, masses, or tenderness noted. Range of motion normal Lungs: Normal respiratory effort; chest expands symmetrically. Lungs are clear to auscultation without rales, wheezes, or increased work of breathing. She exhibits a harsh brassy cough paroxysmally Heart: Normal rate and rhythm. Normal S1 and S2. No gallop, click, or rub. S4 w/o murmur. Abdomen: Bowel sounds normal; abdomen soft and nontender. No masses, organomegaly or hernias noted.No AAA                                                            Musculoskeletal/extremities: No deformity or scoliosis noted of  the thoracic or lumbar spine. No clubbing, cyanosis, edema, or deformity noted. SLR  normal .Tone & strength  normal.Joints normal. Nail health  good. She keeps her hand crest to the right lumbosacral area. She exhibits the classic low back "crawl" when lying down and sitting up. Vascular: Carotid, radial artery, dorsalis pedis and  posterior tibial pulses are full and equal. No bruits present. Homans negative Neurologic: Alert and oriented x3. Deep tendon reflexes symmetrical and normal.          Skin: Intact without suspicious lesions or rashes. Lymph: No cervical, axillary lymphadenopathy present. Psych: Mood and affect are normal. Normally interactive  Assessment & Plan:  #1 bronchitis with reactive airways component. No smoking or asthma history.  #2 acute low back injury most likely related to the paroxysmal coughing. She does have history of osteoporosis. Clinically there is no evidence of disc rupture.  #3 significant penicillin allergy.  Plan: See orders and recommendations

## 2011-06-09 NOTE — Patient Instructions (Addendum)
PT/INR 3/21 was therapeutic. No change in warfarin dose;but  repeat PT/INR on 06/13/2011

## 2011-06-09 NOTE — Telephone Encounter (Signed)
Caller: Sharlene/Patient; PCP: Lelon Perla.; CB#: [phone]; ; ; Call regarding back pain.  Seen 06/03/11 W. URI; Took Zpack, and also taking cipro for positive urine culture.  Afebrile.  Back pain began 06/06/11 but has worsened and now interferes with sleep.  Pain new onset "since coughing so much."  Per protocol, advised appt within 4 hours; appt sched first available in office 06/09/11 1215 with Dr. Alwyn Ren.

## 2011-06-13 ENCOUNTER — Ambulatory Visit (INDEPENDENT_AMBULATORY_CARE_PROVIDER_SITE_OTHER): Payer: BC Managed Care – PPO

## 2011-06-13 ENCOUNTER — Telehealth: Payer: Self-pay | Admitting: Family Medicine

## 2011-06-13 VITALS — BP 120/80 | HR 85 | Temp 98.6°F | Wt 208.4 lb

## 2011-06-13 DIAGNOSIS — Z7901 Long term (current) use of anticoagulants: Secondary | ICD-10-CM

## 2011-06-13 DIAGNOSIS — I82409 Acute embolism and thrombosis of unspecified deep veins of unspecified lower extremity: Secondary | ICD-10-CM

## 2011-06-13 NOTE — Patient Instructions (Signed)
Hold coumadin today and take 2.5 (half tablet) Tues, Wed, Thurs and recheck INR on Friday

## 2011-06-13 NOTE — Telephone Encounter (Signed)
This is ok and I will check it if I have too. Thank you     KP

## 2011-06-13 NOTE — Telephone Encounter (Signed)
Patient stated her PT was high & she needed to be seen again on Friday. I did put here on the schedule at 2pm & this will over book the schedule based on what we were told for scheduling. If I need to re-schedule her let me know and I will call her back . Thanks

## 2011-06-17 ENCOUNTER — Ambulatory Visit (INDEPENDENT_AMBULATORY_CARE_PROVIDER_SITE_OTHER): Payer: BC Managed Care – PPO | Admitting: *Deleted

## 2011-06-17 VITALS — BP 124/80 | HR 81

## 2011-06-17 DIAGNOSIS — I2699 Other pulmonary embolism without acute cor pulmonale: Secondary | ICD-10-CM

## 2011-06-17 LAB — POCT INR: INR: 2.6

## 2011-06-17 NOTE — Patient Instructions (Signed)
Take 2.5mg  daily & return in 1 week.

## 2011-06-20 ENCOUNTER — Other Ambulatory Visit: Payer: Self-pay | Admitting: Family Medicine

## 2011-06-23 ENCOUNTER — Ambulatory Visit (INDEPENDENT_AMBULATORY_CARE_PROVIDER_SITE_OTHER): Payer: BC Managed Care – PPO | Admitting: *Deleted

## 2011-06-23 VITALS — BP 120/78 | HR 97 | Temp 98.3°F | Wt 210.0 lb

## 2011-06-23 DIAGNOSIS — I82409 Acute embolism and thrombosis of unspecified deep veins of unspecified lower extremity: Secondary | ICD-10-CM

## 2011-06-23 DIAGNOSIS — Z7901 Long term (current) use of anticoagulants: Secondary | ICD-10-CM

## 2011-06-23 NOTE — Patient Instructions (Addendum)
Return to office in 1 weeks  New dose: 5 mg daily except 2.5 mg on Tuesday

## 2011-06-30 ENCOUNTER — Ambulatory Visit (INDEPENDENT_AMBULATORY_CARE_PROVIDER_SITE_OTHER): Payer: BC Managed Care – PPO | Admitting: *Deleted

## 2011-06-30 VITALS — BP 100/82 | HR 97 | Temp 98.2°F | Wt 211.0 lb

## 2011-06-30 DIAGNOSIS — I82409 Acute embolism and thrombosis of unspecified deep veins of unspecified lower extremity: Secondary | ICD-10-CM

## 2011-06-30 DIAGNOSIS — Z7901 Long term (current) use of anticoagulants: Secondary | ICD-10-CM

## 2011-06-30 NOTE — Patient Instructions (Signed)
Return to office in 3 weeks  New dose: 5 mg daily except 2.5 mg on Tuesday. Thursday

## 2011-07-04 ENCOUNTER — Ambulatory Visit: Payer: BC Managed Care – PPO

## 2011-07-18 ENCOUNTER — Encounter: Payer: Self-pay | Admitting: Family Medicine

## 2011-07-21 ENCOUNTER — Ambulatory Visit (INDEPENDENT_AMBULATORY_CARE_PROVIDER_SITE_OTHER): Payer: BC Managed Care – PPO

## 2011-07-21 ENCOUNTER — Other Ambulatory Visit: Payer: Self-pay | Admitting: Family Medicine

## 2011-07-21 VITALS — BP 116/70 | HR 96 | Temp 98.4°F | Wt 212.6 lb

## 2011-07-21 DIAGNOSIS — I2699 Other pulmonary embolism without acute cor pulmonale: Secondary | ICD-10-CM

## 2011-07-21 DIAGNOSIS — I82409 Acute embolism and thrombosis of unspecified deep veins of unspecified lower extremity: Secondary | ICD-10-CM

## 2011-07-21 NOTE — Patient Instructions (Signed)
Your INR was Good, Dr.Lowne would like for you to Continue 5 mg all days except Tues and Thurs take 2.5 mg and recheck in 2 weeks

## 2011-07-27 ENCOUNTER — Telehealth: Payer: Self-pay

## 2011-07-27 NOTE — Telephone Encounter (Signed)
Discussed with patient and I made her aware that her aware that BMD was normal and there was no indication for her to take the Fosamax. She voiced understanding, the patient wanted to know if he should continue the Fosamax that she currently has? Please advise    KP  She also said her BMD was coded incorrectly and she was told by the insurance company that we coded the BMD incorrectly. She stated she would find out they code and give [place] a call back.    KP

## 2011-07-27 NOTE — Telephone Encounter (Signed)
It was coded postmenopausal Osteopenia can also be put on it

## 2011-07-29 ENCOUNTER — Encounter: Payer: Self-pay | Admitting: Family Medicine

## 2011-08-01 ENCOUNTER — Ambulatory Visit: Payer: BC Managed Care – PPO | Admitting: Family

## 2011-08-01 NOTE — Telephone Encounter (Signed)
Please add code and refile to imaging dated 3.28.13, order # [id number], for acct# [id number], per Dr.Loqne

## 2011-08-04 ENCOUNTER — Ambulatory Visit (INDEPENDENT_AMBULATORY_CARE_PROVIDER_SITE_OTHER): Payer: BC Managed Care – PPO | Admitting: *Deleted

## 2011-08-04 VITALS — BP 108/82 | HR 88 | Temp 98.1°F | Wt 213.0 lb

## 2011-08-04 DIAGNOSIS — Z7901 Long term (current) use of anticoagulants: Secondary | ICD-10-CM

## 2011-08-04 DIAGNOSIS — I2699 Other pulmonary embolism without acute cor pulmonale: Secondary | ICD-10-CM

## 2011-08-04 NOTE — Patient Instructions (Signed)
Return to office in 2 week  New dosing: Hold today then 5 mg daily except 2.5 mg on M,W,F

## 2011-08-19 ENCOUNTER — Ambulatory Visit (INDEPENDENT_AMBULATORY_CARE_PROVIDER_SITE_OTHER): Payer: BC Managed Care – PPO

## 2011-08-19 VITALS — BP 114/80 | HR 95 | Temp 98.2°F | Wt 214.6 lb

## 2011-08-19 DIAGNOSIS — Z7901 Long term (current) use of anticoagulants: Secondary | ICD-10-CM

## 2011-08-19 NOTE — Patient Instructions (Addendum)
INR was good today. Continue current dose of 2.5mg  on M,W,F and 5 mg all other days and return in 1 month

## 2011-08-26 ENCOUNTER — Other Ambulatory Visit: Payer: Self-pay | Admitting: Family Medicine

## 2011-09-01 ENCOUNTER — Other Ambulatory Visit: Payer: Self-pay | Admitting: Family Medicine

## 2011-09-01 MED ORDER — PANTOPRAZOLE SODIUM 40 MG PO TBEC: 40.0000 mg | DELAYED_RELEASE_TABLET | Freq: Every day | ORAL | Status: DC

## 2011-09-01 NOTE — Telephone Encounter (Signed)
Refill Protonix 40MG  #90, one tablet by mouth every day , last fill 5.9.13, last ov 3.28.13 wt/Hopper

## 2011-09-20 ENCOUNTER — Ambulatory Visit (INDEPENDENT_AMBULATORY_CARE_PROVIDER_SITE_OTHER): Payer: BC Managed Care – PPO | Admitting: *Deleted

## 2011-09-20 DIAGNOSIS — I2699 Other pulmonary embolism without acute cor pulmonale: Secondary | ICD-10-CM

## 2011-09-20 DIAGNOSIS — Z7901 Long term (current) use of anticoagulants: Secondary | ICD-10-CM

## 2011-09-20 LAB — POCT INR: INR: 2.1

## 2011-09-20 NOTE — Patient Instructions (Signed)
Take 5 mg all days except 2.5 mg M,W,F Return in [1] month per Arman Bogus, CMA/SLS

## 2011-09-28 ENCOUNTER — Other Ambulatory Visit: Payer: Self-pay | Admitting: Family Medicine

## 2011-10-21 ENCOUNTER — Ambulatory Visit (INDEPENDENT_AMBULATORY_CARE_PROVIDER_SITE_OTHER): Payer: BC Managed Care – PPO | Admitting: *Deleted

## 2011-10-21 VITALS — BP 110/76 | HR 84 | Wt 216.0 lb

## 2011-10-21 DIAGNOSIS — Z7901 Long term (current) use of anticoagulants: Secondary | ICD-10-CM

## 2011-10-21 DIAGNOSIS — I2699 Other pulmonary embolism without acute cor pulmonale: Secondary | ICD-10-CM

## 2011-10-21 NOTE — Patient Instructions (Signed)
Return to office in 1 week  New dosing: 5 mg daily except 2.5 mg M,W

## 2011-10-25 ENCOUNTER — Other Ambulatory Visit: Payer: Self-pay | Admitting: Family Medicine

## 2011-10-28 ENCOUNTER — Ambulatory Visit (INDEPENDENT_AMBULATORY_CARE_PROVIDER_SITE_OTHER): Payer: BC Managed Care – PPO | Admitting: *Deleted

## 2011-10-28 VITALS — BP 120/82 | HR 83 | Wt 216.0 lb

## 2011-10-28 DIAGNOSIS — I2699 Other pulmonary embolism without acute cor pulmonale: Secondary | ICD-10-CM

## 2011-10-28 DIAGNOSIS — Z7901 Long term (current) use of anticoagulants: Secondary | ICD-10-CM

## 2011-10-28 LAB — POCT INR: INR: 2.6

## 2011-10-28 NOTE — Patient Instructions (Signed)
Return to office in 4 weeks   Continue current dose:   5 mg all days except 2.5 mg M,W,

## 2011-11-28 ENCOUNTER — Ambulatory Visit (INDEPENDENT_AMBULATORY_CARE_PROVIDER_SITE_OTHER): Payer: BC Managed Care – PPO

## 2011-11-28 ENCOUNTER — Other Ambulatory Visit: Payer: Self-pay | Admitting: Family Medicine

## 2011-11-28 VITALS — BP 134/84 | HR 87 | Temp 98.5°F | Wt 217.0 lb

## 2011-11-28 DIAGNOSIS — I82409 Acute embolism and thrombosis of unspecified deep veins of unspecified lower extremity: Secondary | ICD-10-CM

## 2011-11-28 NOTE — Patient Instructions (Addendum)
Today your INR is at 2.9 which is within normal range. Dr.Lowne would like for you to continue taking you coumadin as directed. (5 mg everyday except Mon, and Wed where you would take 2.5 mg coumadin) and recheck in 1 month or sooner if needed.

## 2011-12-28 ENCOUNTER — Other Ambulatory Visit: Payer: Self-pay | Admitting: Family Medicine

## 2011-12-28 ENCOUNTER — Ambulatory Visit (INDEPENDENT_AMBULATORY_CARE_PROVIDER_SITE_OTHER): Payer: BC Managed Care – PPO

## 2011-12-28 VITALS — BP 124/72 | HR 93 | Wt 218.0 lb

## 2011-12-28 DIAGNOSIS — Z23 Encounter for immunization: Secondary | ICD-10-CM

## 2011-12-28 DIAGNOSIS — I82409 Acute embolism and thrombosis of unspecified deep veins of unspecified lower extremity: Secondary | ICD-10-CM

## 2011-12-28 NOTE — Telephone Encounter (Signed)
OV 06/09/11. Last filled 11/28/11 #30 no refills.    Plz advise    MW

## 2011-12-28 NOTE — Patient Instructions (Addendum)
No change and recheck in 4 weeks (5 mg daily, 2.5 mg on Mon/Fri)

## 2011-12-30 ENCOUNTER — Other Ambulatory Visit: Payer: Self-pay

## 2011-12-30 NOTE — Telephone Encounter (Signed)
Pt called in concerning Rx for warfarin left message on triage line. I called her back to advise her Rx sent/ approve this AM 10:39.   Pt stated she will call [facility] to see when ready so she can make 1 trip.    MW

## 2011-12-30 NOTE — Telephone Encounter (Signed)
Refill done.  

## 2012-01-25 ENCOUNTER — Ambulatory Visit (INDEPENDENT_AMBULATORY_CARE_PROVIDER_SITE_OTHER): Payer: BC Managed Care – PPO | Admitting: *Deleted

## 2012-01-25 VITALS — BP 132/84 | HR 92 | Wt 218.0 lb

## 2012-01-25 DIAGNOSIS — I82409 Acute embolism and thrombosis of unspecified deep veins of unspecified lower extremity: Secondary | ICD-10-CM

## 2012-01-25 DIAGNOSIS — I82403 Acute embolism and thrombosis of unspecified deep veins of lower extremity, bilateral: Secondary | ICD-10-CM

## 2012-01-25 LAB — POCT INR: INR: 2.2

## 2012-01-25 NOTE — Patient Instructions (Signed)
No change. Continue taking 5mg  daily. 2.5mg  Monday & Wednesday. Come back in 4 weeks.

## 2012-01-31 ENCOUNTER — Other Ambulatory Visit: Payer: Self-pay | Admitting: Family Medicine

## 2012-01-31 NOTE — Telephone Encounter (Signed)
Rx sent.    MW 

## 2012-02-27 ENCOUNTER — Other Ambulatory Visit: Payer: Self-pay | Admitting: Family Medicine

## 2012-02-27 ENCOUNTER — Other Ambulatory Visit: Payer: Self-pay | Admitting: Internal Medicine

## 2012-02-29 ENCOUNTER — Ambulatory Visit (INDEPENDENT_AMBULATORY_CARE_PROVIDER_SITE_OTHER): Payer: BC Managed Care – PPO | Admitting: *Deleted

## 2012-02-29 VITALS — BP 110/72 | HR 95 | Wt 219.0 lb

## 2012-02-29 DIAGNOSIS — I82409 Acute embolism and thrombosis of unspecified deep veins of unspecified lower extremity: Secondary | ICD-10-CM

## 2012-02-29 DIAGNOSIS — Z7901 Long term (current) use of anticoagulants: Secondary | ICD-10-CM

## 2012-02-29 LAB — POCT INR: INR: 2.4

## 2012-02-29 NOTE — Patient Instructions (Signed)
Return to office in 4 weeks  Continue Current dose: 5 mg daily except 2.5 mg M,W  For cold symptoms: Zicam melts or Zinc lozenges: vitamin C 2000 mg daily; & Echinacea for 4-7 days. Report fever, exudate ('"pus") or progressive pain.

## 2012-03-26 ENCOUNTER — Other Ambulatory Visit: Payer: Self-pay | Admitting: Internal Medicine

## 2012-04-02 ENCOUNTER — Ambulatory Visit (INDEPENDENT_AMBULATORY_CARE_PROVIDER_SITE_OTHER): Payer: Managed Care, Other (non HMO) | Admitting: *Deleted

## 2012-04-02 VITALS — BP 122/82 | HR 81 | Wt 218.0 lb

## 2012-04-02 DIAGNOSIS — Z7901 Long term (current) use of anticoagulants: Secondary | ICD-10-CM

## 2012-04-02 DIAGNOSIS — I82409 Acute embolism and thrombosis of unspecified deep veins of unspecified lower extremity: Secondary | ICD-10-CM

## 2012-04-02 NOTE — Patient Instructions (Signed)
Return to office in 3 weeks  New dosing: 5 mg today then resume 5 mg daily except 2.5 mg M,W

## 2012-04-26 ENCOUNTER — Ambulatory Visit: Payer: Managed Care, Other (non HMO)

## 2012-04-30 ENCOUNTER — Other Ambulatory Visit: Payer: Self-pay | Admitting: Family Medicine

## 2012-05-02 ENCOUNTER — Ambulatory Visit (INDEPENDENT_AMBULATORY_CARE_PROVIDER_SITE_OTHER): Payer: Managed Care, Other (non HMO) | Admitting: *Deleted

## 2012-05-02 VITALS — BP 122/78 | HR 94 | Wt 220.0 lb

## 2012-05-02 DIAGNOSIS — I82409 Acute embolism and thrombosis of unspecified deep veins of unspecified lower extremity: Secondary | ICD-10-CM

## 2012-05-02 DIAGNOSIS — Z7901 Long term (current) use of anticoagulants: Secondary | ICD-10-CM

## 2012-05-02 LAB — POCT INR: INR: 2.5

## 2012-05-02 NOTE — Patient Instructions (Signed)
Return to office in 4 weeks  Continue current dose: 5mg  daily except 2.5 mg Mon,Wed

## 2012-05-29 ENCOUNTER — Other Ambulatory Visit: Payer: Self-pay | Admitting: Family Medicine

## 2012-05-31 ENCOUNTER — Encounter: Payer: Self-pay | Admitting: Lab

## 2012-06-01 ENCOUNTER — Ambulatory Visit: Payer: Managed Care, Other (non HMO) | Admitting: Family Medicine

## 2012-06-01 ENCOUNTER — Ambulatory Visit (INDEPENDENT_AMBULATORY_CARE_PROVIDER_SITE_OTHER): Payer: Managed Care, Other (non HMO) | Admitting: *Deleted

## 2012-06-01 VITALS — BP 118/80 | HR 87 | Wt 222.0 lb

## 2012-06-01 DIAGNOSIS — I82409 Acute embolism and thrombosis of unspecified deep veins of unspecified lower extremity: Secondary | ICD-10-CM

## 2012-06-01 DIAGNOSIS — Z7901 Long term (current) use of anticoagulants: Secondary | ICD-10-CM

## 2012-06-01 NOTE — Patient Instructions (Signed)
Return to office in 4 weeks  Continue current dose: 5 mg daily except 2.5 mg on M,W

## 2012-06-25 ENCOUNTER — Other Ambulatory Visit: Payer: Self-pay | Admitting: Family Medicine

## 2012-06-25 NOTE — Telephone Encounter (Signed)
No vitamin D done. Please advise     KP

## 2012-06-25 NOTE — Telephone Encounter (Signed)
Labs due in June--  Add to June labs

## 2012-06-28 ENCOUNTER — Ambulatory Visit (INDEPENDENT_AMBULATORY_CARE_PROVIDER_SITE_OTHER): Payer: Managed Care, Other (non HMO) | Admitting: *Deleted

## 2012-06-28 VITALS — BP 102/82 | HR 91 | Wt 221.0 lb

## 2012-06-28 DIAGNOSIS — I82409 Acute embolism and thrombosis of unspecified deep veins of unspecified lower extremity: Secondary | ICD-10-CM

## 2012-06-28 DIAGNOSIS — Z7901 Long term (current) use of anticoagulants: Secondary | ICD-10-CM

## 2012-06-28 NOTE — Patient Instructions (Signed)
Return to office in 2 weeks  New dosing:  7.5 mg today then 5 mg daily except 2.5 mg on Monday

## 2012-07-04 ENCOUNTER — Ambulatory Visit: Payer: Managed Care, Other (non HMO)

## 2012-07-06 ENCOUNTER — Ambulatory Visit: Payer: Managed Care, Other (non HMO)

## 2012-07-13 ENCOUNTER — Ambulatory Visit (INDEPENDENT_AMBULATORY_CARE_PROVIDER_SITE_OTHER): Payer: Managed Care, Other (non HMO) | Admitting: *Deleted

## 2012-07-13 VITALS — BP 118/82 | HR 72 | Resp 16 | Wt 222.1 lb

## 2012-07-13 DIAGNOSIS — Z2911 Encounter for prophylactic immunotherapy for respiratory syncytial virus (RSV): Secondary | ICD-10-CM

## 2012-07-13 DIAGNOSIS — Z23 Encounter for immunization: Secondary | ICD-10-CM

## 2012-07-13 DIAGNOSIS — I2699 Other pulmonary embolism without acute cor pulmonale: Secondary | ICD-10-CM

## 2012-07-13 LAB — POCT INR: INR: 2.5

## 2012-07-13 NOTE — Patient Instructions (Addendum)
Return Date: 1 month  Dosage: continue current regimen 5mg  every day but Mondays take 2.5mg 

## 2012-07-24 ENCOUNTER — Other Ambulatory Visit: Payer: Self-pay | Admitting: Family Medicine

## 2012-07-24 NOTE — Telephone Encounter (Signed)
Med filled.  

## 2012-08-14 ENCOUNTER — Ambulatory Visit (INDEPENDENT_AMBULATORY_CARE_PROVIDER_SITE_OTHER): Payer: Managed Care, Other (non HMO) | Admitting: *Deleted

## 2012-08-14 DIAGNOSIS — I82409 Acute embolism and thrombosis of unspecified deep veins of unspecified lower extremity: Secondary | ICD-10-CM

## 2012-08-14 DIAGNOSIS — Z7901 Long term (current) use of anticoagulants: Secondary | ICD-10-CM

## 2012-08-14 LAB — POCT INR: INR: 2.8

## 2012-08-14 NOTE — Patient Instructions (Signed)
Return to office in 4 weeks  Continue current dose: 5 mg daily except 2.5 mg on Monday

## 2012-08-21 ENCOUNTER — Other Ambulatory Visit: Payer: Self-pay | Admitting: Family Medicine

## 2012-09-10 ENCOUNTER — Ambulatory Visit (INDEPENDENT_AMBULATORY_CARE_PROVIDER_SITE_OTHER): Payer: Managed Care, Other (non HMO) | Admitting: *Deleted

## 2012-09-10 VITALS — BP 106/70 | HR 95 | Temp 97.8°F | Wt 220.6 lb

## 2012-09-10 DIAGNOSIS — Z7901 Long term (current) use of anticoagulants: Secondary | ICD-10-CM

## 2012-09-10 NOTE — Patient Instructions (Addendum)
Continue same dosing.  5mg  on all days except 2.5 mg on Mondays. Return in one month to recheck.

## 2012-09-26 ENCOUNTER — Other Ambulatory Visit: Payer: Self-pay | Admitting: *Deleted

## 2012-09-26 MED ORDER — WARFARIN SODIUM 5 MG PO TABS: ORAL_TABLET | ORAL | Status: DC

## 2012-09-26 NOTE — Telephone Encounter (Signed)
Refilled patients warfarin 5 mg tab sent to the [facility] 09/26/2012.  However could not fill the vitamin D patient needs labs first.

## 2012-09-28 ENCOUNTER — Telehealth: Payer: Self-pay | Admitting: *Deleted

## 2012-09-28 MED ORDER — VITAMIN D (ERGOCALCIFEROL) 1.25 MG (50000 UNIT) PO CAPS: ORAL_CAPSULE | ORAL | Status: DC

## 2012-09-28 NOTE — Telephone Encounter (Signed)
Patient has not had any recent labs for Vitamin D levels.  Last fill date 06/25/2012 Last OV was 09/10/12 for anti-coag visit.  Please advise

## 2012-09-28 NOTE — Telephone Encounter (Signed)
If vita d has not been checked in last 12 months ( it doesn't look like she has) we can check vita D at her convenience--- ok to refill vita D 3 months

## 2012-10-10 ENCOUNTER — Ambulatory Visit (INDEPENDENT_AMBULATORY_CARE_PROVIDER_SITE_OTHER): Payer: Managed Care, Other (non HMO)

## 2012-10-10 VITALS — BP 152/98 | HR 93 | Temp 98.0°F | Wt 222.4 lb

## 2012-10-10 DIAGNOSIS — I82403 Acute embolism and thrombosis of unspecified deep veins of lower extremity, bilateral: Secondary | ICD-10-CM

## 2012-10-10 DIAGNOSIS — I82409 Acute embolism and thrombosis of unspecified deep veins of unspecified lower extremity: Secondary | ICD-10-CM

## 2012-10-10 DIAGNOSIS — I2699 Other pulmonary embolism without acute cor pulmonale: Secondary | ICD-10-CM

## 2012-10-10 LAB — POCT INR: INR: 2.7

## 2012-10-10 NOTE — Patient Instructions (Signed)
Please continue the same dosing. 5mg  everyday except Monday 2.5mg .  Thank you for coming in today and keep up the good work!!

## 2012-11-09 ENCOUNTER — Ambulatory Visit (INDEPENDENT_AMBULATORY_CARE_PROVIDER_SITE_OTHER): Payer: Managed Care, Other (non HMO) | Admitting: *Deleted

## 2012-11-09 VITALS — BP 115/70 | HR 83 | Temp 98.2°F | Wt 221.0 lb

## 2012-11-09 DIAGNOSIS — I2699 Other pulmonary embolism without acute cor pulmonale: Secondary | ICD-10-CM

## 2012-11-09 DIAGNOSIS — I82409 Acute embolism and thrombosis of unspecified deep veins of unspecified lower extremity: Secondary | ICD-10-CM

## 2012-11-09 LAB — POCT INR: INR: 3.6

## 2012-11-09 NOTE — Patient Instructions (Addendum)
Hold today then continue 5mg  daily except 2.5mg  Mon.  Recheck in 1wk.

## 2012-11-16 ENCOUNTER — Ambulatory Visit (INDEPENDENT_AMBULATORY_CARE_PROVIDER_SITE_OTHER): Payer: Managed Care, Other (non HMO)

## 2012-11-16 VITALS — BP 130/80 | HR 90 | Temp 97.7°F | Wt 222.8 lb

## 2012-11-16 DIAGNOSIS — I82403 Acute embolism and thrombosis of unspecified deep veins of lower extremity, bilateral: Secondary | ICD-10-CM

## 2012-11-16 DIAGNOSIS — I82409 Acute embolism and thrombosis of unspecified deep veins of unspecified lower extremity: Secondary | ICD-10-CM

## 2012-11-16 LAB — POCT INR: INR: 2.7

## 2012-11-16 NOTE — Patient Instructions (Addendum)
Continue dosing as 5mg  everyday except Monday 2.5mg . Recheck in one month. Keep up the good work.

## 2012-11-26 ENCOUNTER — Other Ambulatory Visit: Payer: Self-pay | Admitting: *Deleted

## 2012-11-26 MED ORDER — WARFARIN SODIUM 5 MG PO TABS: ORAL_TABLET | ORAL | Status: DC

## 2012-11-26 MED ORDER — PANTOPRAZOLE SODIUM 40 MG PO TBEC: DELAYED_RELEASE_TABLET | ORAL | Status: DC

## 2012-11-26 NOTE — Telephone Encounter (Signed)
Rx was refilled for warfarin 5mg .  Ag cma

## 2012-11-26 NOTE — Telephone Encounter (Signed)
Rx has been refilled for a 30 day supply patient office visit is overdue.  AG cma

## 2012-12-14 ENCOUNTER — Other Ambulatory Visit: Payer: Self-pay | Admitting: Family Medicine

## 2012-12-14 ENCOUNTER — Ambulatory Visit (INDEPENDENT_AMBULATORY_CARE_PROVIDER_SITE_OTHER): Payer: Managed Care, Other (non HMO) | Admitting: *Deleted

## 2012-12-14 ENCOUNTER — Other Ambulatory Visit: Payer: Managed Care, Other (non HMO)

## 2012-12-14 VITALS — BP 132/76 | HR 98 | Temp 97.2°F | Wt 225.0 lb

## 2012-12-14 DIAGNOSIS — I2699 Other pulmonary embolism without acute cor pulmonale: Secondary | ICD-10-CM

## 2012-12-14 DIAGNOSIS — Z7901 Long term (current) use of anticoagulants: Secondary | ICD-10-CM

## 2012-12-14 DIAGNOSIS — I82403 Acute embolism and thrombosis of unspecified deep veins of lower extremity, bilateral: Secondary | ICD-10-CM

## 2012-12-14 DIAGNOSIS — I82409 Acute embolism and thrombosis of unspecified deep veins of unspecified lower extremity: Secondary | ICD-10-CM

## 2012-12-14 DIAGNOSIS — E559 Vitamin D deficiency, unspecified: Secondary | ICD-10-CM

## 2012-12-14 LAB — POCT INR: INR: 2.7

## 2012-12-14 NOTE — Patient Instructions (Signed)
5 mg all days except 2.5 mg on Monday Return in 4 weeks for INR check

## 2013-01-01 ENCOUNTER — Other Ambulatory Visit: Payer: Self-pay

## 2013-01-01 MED ORDER — WARFARIN SODIUM 5 MG PO TABS: ORAL_TABLET | ORAL | Status: DC

## 2013-01-01 MED ORDER — PANTOPRAZOLE SODIUM 40 MG PO TBEC: DELAYED_RELEASE_TABLET | ORAL | Status: DC

## 2013-01-04 LAB — HM MAMMOGRAPHY: HM Mammogram: NORMAL

## 2013-01-11 ENCOUNTER — Ambulatory Visit (INDEPENDENT_AMBULATORY_CARE_PROVIDER_SITE_OTHER): Payer: Managed Care, Other (non HMO) | Admitting: *Deleted

## 2013-01-11 VITALS — BP 132/74 | HR 90 | Temp 98.5°F | Wt 223.0 lb

## 2013-01-11 DIAGNOSIS — E559 Vitamin D deficiency, unspecified: Secondary | ICD-10-CM

## 2013-01-11 DIAGNOSIS — I82409 Acute embolism and thrombosis of unspecified deep veins of unspecified lower extremity: Secondary | ICD-10-CM

## 2013-01-11 DIAGNOSIS — Z7901 Long term (current) use of anticoagulants: Secondary | ICD-10-CM

## 2013-01-11 DIAGNOSIS — I2699 Other pulmonary embolism without acute cor pulmonale: Secondary | ICD-10-CM

## 2013-01-11 DIAGNOSIS — Z23 Encounter for immunization: Secondary | ICD-10-CM

## 2013-01-11 DIAGNOSIS — I82403 Acute embolism and thrombosis of unspecified deep veins of lower extremity, bilateral: Secondary | ICD-10-CM

## 2013-01-11 LAB — POCT INR: INR: 2.8

## 2013-01-11 MED ORDER — VITAMIN D (ERGOCALCIFEROL) 1.25 MG (50000 UNIT) PO CAPS: ORAL_CAPSULE | ORAL | Status: DC

## 2013-01-11 NOTE — Patient Instructions (Signed)
Continue with current dosing of 2.5mg  on Monday and 5mg  all other days. Return in 1 month for recheck on INR.

## 2013-01-17 ENCOUNTER — Encounter: Payer: Managed Care, Other (non HMO) | Admitting: Family Medicine

## 2013-01-28 ENCOUNTER — Other Ambulatory Visit: Payer: Self-pay | Admitting: Family Medicine

## 2013-02-04 ENCOUNTER — Telehealth: Payer: Self-pay

## 2013-02-04 NOTE — Telephone Encounter (Addendum)
Medication and allergies: reviewed and updated  90 day supply/mail order: na Local pharmacy: [facility]   Immunizations due:  Tdap possibly (unsure if she had one with ankle surgery--records have been entered from previous providers)  A/P:   No changes to FH or PSH Health Maintenance  Topic Date Due  . Tetanus/tdap  11/08/1970  . Colonoscopy  06/02/2013  . Influenza Vaccine  10/12/2013  . Pap Smear  06/03/2014  . Mammogram  01/05/2015  . Zostavax  Completed    To Discuss with Provider: Not at this time Tdap? Bone Density

## 2013-02-05 ENCOUNTER — Ambulatory Visit (INDEPENDENT_AMBULATORY_CARE_PROVIDER_SITE_OTHER): Payer: Managed Care, Other (non HMO) | Admitting: Family Medicine

## 2013-02-05 ENCOUNTER — Encounter: Payer: Self-pay | Admitting: Family Medicine

## 2013-02-05 VITALS — BP 124/72 | HR 94 | Resp 16 | Ht 70.0 in | Wt 222.6 lb

## 2013-02-05 DIAGNOSIS — M1990 Unspecified osteoarthritis, unspecified site: Secondary | ICD-10-CM

## 2013-02-05 DIAGNOSIS — Z0000 Encounter for general adult medical examination without abnormal findings: Secondary | ICD-10-CM

## 2013-02-05 DIAGNOSIS — M129 Arthropathy, unspecified: Secondary | ICD-10-CM

## 2013-02-05 DIAGNOSIS — K219 Gastro-esophageal reflux disease without esophagitis: Secondary | ICD-10-CM

## 2013-02-05 DIAGNOSIS — I82409 Acute embolism and thrombosis of unspecified deep veins of unspecified lower extremity: Secondary | ICD-10-CM

## 2013-02-05 DIAGNOSIS — Z23 Encounter for immunization: Secondary | ICD-10-CM

## 2013-02-05 DIAGNOSIS — E669 Obesity, unspecified: Secondary | ICD-10-CM | POA: Insufficient documentation

## 2013-02-05 DIAGNOSIS — I2782 Chronic pulmonary embolism: Secondary | ICD-10-CM

## 2013-02-05 DIAGNOSIS — I2699 Other pulmonary embolism without acute cor pulmonale: Secondary | ICD-10-CM

## 2013-02-05 DIAGNOSIS — I82403 Acute embolism and thrombosis of unspecified deep veins of lower extremity, bilateral: Secondary | ICD-10-CM

## 2013-02-05 LAB — POCT URINALYSIS DIPSTICK
Bilirubin, UA: NEGATIVE
Blood, UA: NEGATIVE
Glucose, UA: NEGATIVE
Nitrite, UA: NEGATIVE
Protein, UA: NEGATIVE
Spec Grav, UA: 1.015
Urobilinogen, UA: 0.2

## 2013-02-05 LAB — CBC WITH DIFFERENTIAL/PLATELET
Basophils Absolute: 0 10*3/uL (ref 0.0–0.1)
HCT: 41.1 % (ref 36.0–46.0)
Lymphs Abs: 1.1 10*3/uL (ref 0.7–4.0)
Monocytes Absolute: 0.4 10*3/uL (ref 0.1–1.0)
Monocytes Relative: 6.4 % (ref 3.0–12.0)
Neutrophils Relative %: 73.3 % (ref 43.0–77.0)
Platelets: 276 10*3/uL (ref 150.0–400.0)
RDW: 14.3 % (ref 11.5–14.6)

## 2013-02-05 LAB — LIPID PANEL
Cholesterol: 274 mg/dL — ABNORMAL HIGH (ref 0–200)
HDL: 51.9 mg/dL (ref >39.00)
Total CHOL/HDL Ratio: 5
VLDL: 34.4 mg/dL (ref 0.0–40.0)

## 2013-02-05 LAB — TSH: TSH: 1.39 u[IU]/mL (ref 0.35–5.50)

## 2013-02-05 LAB — HEPATIC FUNCTION PANEL
ALT: 23 U/L (ref 0–35)
AST: 21 U/L (ref 0–37)
Albumin: 4.3 g/dL (ref 3.5–5.2)
Alkaline Phosphatase: 76 U/L (ref 39–117)
Bilirubin, Direct: 0 mg/dL (ref 0.0–0.3)
Total Bilirubin: 0.7 mg/dL (ref 0.3–1.2)

## 2013-02-05 LAB — BASIC METABOLIC PANEL
BUN: 14 mg/dL (ref 6–23)
CO2: 27 mEq/L (ref 19–32)
Creatinine, Ser: 0.8 mg/dL (ref 0.4–1.2)
GFR: 76.34 mL/min (ref >60.00)
Glucose, Bld: 103 mg/dL — ABNORMAL HIGH (ref 70–99)
Potassium: 3.9 mEq/L (ref 3.5–5.1)
Sodium: 137 mEq/L (ref 135–145)

## 2013-02-05 LAB — LDL CHOLESTEROL, DIRECT: Direct LDL: 199 mg/dL

## 2013-02-05 LAB — HEMOGLOBIN A1C: Hgb A1c MFr Bld: 6.2 % (ref 4.6–6.5)

## 2013-02-05 MED ORDER — PANTOPRAZOLE SODIUM 40 MG PO TBEC: DELAYED_RELEASE_TABLET | ORAL | Status: DC

## 2013-02-05 MED ORDER — TRAMADOL HCL 50 MG PO TABS: 50.0000 mg | ORAL_TABLET | Freq: Three times a day (TID) | ORAL | Status: DC | PRN

## 2013-02-05 NOTE — Patient Instructions (Signed)
Preventive Care for Adults, Female A healthy lifestyle and preventive care can promote health and wellness. Preventive health guidelines for women include the following key practices.  A routine yearly physical is a good way to check with your caregiver about your health and preventive screening. It is a chance to share any concerns and updates on your health, and to receive a thorough exam.  Visit your dentist for a routine exam and preventive care every 6 months. Brush your teeth twice a day and floss once a day. Good oral hygiene prevents tooth decay and gum disease.  The frequency of eye exams is based on your age, health, family medical history, use of contact lenses, and other factors. Follow your caregiver's recommendations for frequency of eye exams.  Eat a healthy diet. Foods like vegetables, fruits, whole grains, low-fat dairy products, and lean protein foods contain the nutrients you need without too many calories. Decrease your intake of foods high in solid fats, added sugars, and salt. Eat the right amount of calories for you.Get information about a proper diet from your caregiver, if necessary.  Regular physical exercise is one of the most important things you can do for your health. Most adults should get at least 150 minutes of moderate-intensity exercise (any activity that increases your heart rate and causes you to sweat) each week. In addition, most adults need muscle-strengthening exercises on 2 or more days a week.  Maintain a healthy weight. The body mass index (BMI) is a screening tool to identify possible weight problems. It provides an estimate of body fat based on height and weight. Your caregiver can help determine your BMI, and can help you achieve or maintain a healthy weight.For adults 20 years and older:  A BMI below 18.5 is considered underweight.  A BMI of 18.5 to 24.9 is normal.  A BMI of 25 to 29.9 is considered overweight.  A BMI of 30 and above is  considered obese.  Maintain normal blood lipids and cholesterol levels by exercising and minimizing your intake of saturated fat. Eat a balanced diet with plenty of fruit and vegetables. Blood tests for lipids and cholesterol should begin at age 20 and be repeated every 5 years. If your lipid or cholesterol levels are high, you are over 50, or you are at high risk for heart disease, you may need your cholesterol levels checked more frequently.Ongoing high lipid and cholesterol levels should be treated with medicines if diet and exercise are not effective.  If you smoke, find out from your caregiver how to quit. If you do not use tobacco, do not start.  Lung cancer screening is recommended for adults aged 55 80 years who are at high risk for developing lung cancer because of a history of smoking. Yearly low-dose computed tomography (CT) is recommended for people who have at least a 30-pack-year history of smoking and are a current smoker or have quit within the past 15 years. A pack year of smoking is smoking an average of 1 pack of cigarettes a day for 1 year (for example: 1 pack a day for 30 years or 2 packs a day for 15 years). Yearly screening should continue until the smoker has stopped smoking for at least 15 years. Yearly screening should also be stopped for people who develop a health problem that would prevent them from having lung cancer treatment.  If you are pregnant, do not drink alcohol. If you are breastfeeding, be very cautious about drinking alcohol. If you are   not pregnant and choose to drink alcohol, do not exceed 1 drink per day. One drink is considered to be 12 ounces (355 mL) of beer, 5 ounces (148 mL) of wine, or 1.5 ounces (44 mL) of liquor.  Avoid use of street drugs. Do not share needles with anyone. Ask for help if you need support or instructions about stopping the use of drugs.  High blood pressure causes heart disease and increases the risk of stroke. Your blood pressure  should be checked at least every 1 to 2 years. Ongoing high blood pressure should be treated with medicines if weight loss and exercise are not effective.  If you are 55 to 61 years old, ask your caregiver if you should take aspirin to prevent strokes.  Diabetes screening involves taking a blood sample to check your fasting blood sugar level. This should be done once every 3 years, after age 45, if you are within normal weight and without risk factors for diabetes. Testing should be considered at a younger age or be carried out more frequently if you are overweight and have at least 1 risk factor for diabetes.  Breast cancer screening is essential preventive care for women. You should practice "breast self-awareness." This means understanding the normal appearance and feel of your breasts and may include breast self-examination. Any changes detected, no matter how small, should be reported to a caregiver. Women in their 20s and 30s should have a clinical breast exam (CBE) by a caregiver as part of a regular health exam every 1 to 3 years. After age 40, women should have a CBE every year. Starting at age 40, women should consider having a mammography (breast X-ray test) every year. Women who have a family history of breast cancer should talk to their caregiver about genetic screening. Women at a high risk of breast cancer should talk to their caregivers about having magnetic resonance imaging (MRI) and a mammography every year.  Breast cancer gene (BRCA)-related cancer risk assessment is recommended for women who have family members with BRCA-related cancers. BRCA-related cancers include breast, ovarian, tubal, and peritoneal cancers. Having family members with these cancers may be associated with an increased risk for harmful changes (mutations) in the breast cancer genes BRCA1 and BRCA2. Results of the assessment will determine the need for genetic counseling and BRCA1 and BRCA2 testing.  The Pap test is  a screening test for cervical cancer. A Pap test can show cell changes on the cervix that might become cervical cancer if left untreated. A Pap test is a procedure in which cells are obtained and examined from the lower end of the uterus (cervix).  Women should have a Pap test starting at age 21.  Between ages 21 and 29, Pap tests should be repeated every 2 years.  Beginning at age 30, you should have a Pap test every 3 years as long as the past 3 Pap tests have been normal.  Some women have medical problems that increase the chance of getting cervical cancer. Talk to your caregiver about these problems. It is especially important to talk to your caregiver if a new problem develops soon after your last Pap test. In these cases, your caregiver may recommend more frequent screening and Pap tests.  The above recommendations are the same for women who have or have not gotten the vaccine for human papillomavirus (HPV).  If you had a hysterectomy for a problem that was not cancer or a condition that could lead to cancer, then   you no longer need Pap tests. Even if you no longer need a Pap test, a regular exam is a good idea to make sure no other problems are starting.  If you are between ages 65 and 70, and you have had normal Pap tests going back 10 years, you no longer need Pap tests. Even if you no longer need a Pap test, a regular exam is a good idea to make sure no other problems are starting.  If you have had past treatment for cervical cancer or a condition that could lead to cancer, you need Pap tests and screening for cancer for at least 20 years after your treatment.  If Pap tests have been discontinued, risk factors (such as a new sexual partner) need to be reassessed to determine if screening should be resumed.  The HPV test is an additional test that may be used for cervical cancer screening. The HPV test looks for the virus that can cause the cell changes on the cervix. The cells collected  during the Pap test can be tested for HPV. The HPV test could be used to screen women aged 30 years and older, and should be used in women of any age who have unclear Pap test results. After the age of 30, women should have HPV testing at the same frequency as a Pap test.  Colorectal cancer can be detected and often prevented. Most routine colorectal cancer screening begins at the age of 50 and continues through age 75. However, your caregiver may recommend screening at an earlier age if you have risk factors for colon cancer. On a yearly basis, your caregiver may provide home test kits to check for hidden blood in the stool. Use of a small camera at the end of a tube, to directly examine the colon (sigmoidoscopy or colonoscopy), can detect the earliest forms of colorectal cancer. Talk to your caregiver about this at age 50, when routine screening begins. Direct examination of the colon should be repeated every 5 to 10 years through age 75, unless early forms of pre-cancerous polyps or small growths are found.  Hepatitis C blood testing is recommended for all people born from 1945 through 1965 and any individual with known risks for hepatitis C.  Practice safe sex. Use condoms and avoid high-risk sexual practices to reduce the spread of sexually transmitted infections (STIs). STIs include gonorrhea, chlamydia, syphilis, trichomonas, herpes, HPV, and human immunodeficiency virus (HIV). Herpes, HIV, and HPV are viral illnesses that have no cure. They can result in disability, cancer, and death. Sexually active women aged 25 and younger should be checked for chlamydia. Older women with new or multiple partners should also be tested for chlamydia. Testing for other STIs is recommended if you are sexually active and at increased risk.  Osteoporosis is a disease in which the bones lose minerals and strength with aging. This can result in serious bone fractures. The risk of osteoporosis can be identified using a  bone density scan. Women ages 65 and over and women at risk for fractures or osteoporosis should discuss screening with their caregivers. Ask your caregiver whether you should take a calcium supplement or vitamin D to reduce the rate of osteoporosis.  Menopause can be associated with physical symptoms and risks. Hormone replacement therapy is available to decrease symptoms and risks. You should talk to your caregiver about whether hormone replacement therapy is right for you.  Use sunscreen. Apply sunscreen liberally and repeatedly throughout the day. You should seek shade   when your shadow is shorter than you. Protect yourself by wearing long sleeves, pants, a wide-brimmed hat, and sunglasses year round, whenever you are outdoors.  Once a month, do a whole body skin exam, using a mirror to look at the skin on your back. Notify your caregiver of new moles, moles that have irregular borders, moles that are larger than a pencil eraser, or moles that have changed in shape or color.  Stay current with required immunizations.  Influenza vaccine. All adults should be immunized every year.  Tetanus, diphtheria, and acellular pertussis (Td, Tdap) vaccine. Pregnant women should receive 1 dose of Tdap vaccine during each pregnancy. The dose should be obtained regardless of the length of time since the last dose. Immunization is preferred during the 27th to 36th week of gestation. An adult who has not previously received Tdap or who does not know her vaccine status should receive 1 dose of Tdap. This initial dose should be followed by tetanus and diphtheria toxoids (Td) booster doses every 10 years. Adults with an unknown or incomplete history of completing a 3-dose immunization series with Td-containing vaccines should begin or complete a primary immunization series including a Tdap dose. Adults should receive a Td booster every 10 years.  Varicella vaccine. An adult without evidence of immunity to varicella  should receive 2 doses or a second dose if she has previously received 1 dose. Pregnant females who do not have evidence of immunity should receive the first dose after pregnancy. This first dose should be obtained before leaving the health care facility. The second dose should be obtained 4 8 weeks after the first dose.  Human papillomavirus (HPV) vaccine. Females aged 13 26 years who have not received the vaccine previously should obtain the 3-dose series. The vaccine is not recommended for use in pregnant females. However, pregnancy testing is not needed before receiving a dose. If a female is found to be pregnant after receiving a dose, no treatment is needed. In that case, the remaining doses should be delayed until after the pregnancy. Immunization is recommended for any person with an immunocompromised condition through the age of 26 years if she did not get any or all doses earlier. During the 3-dose series, the second dose should be obtained 4 8 weeks after the first dose. The third dose should be obtained 24 weeks after the first dose and 16 weeks after the second dose.  Zoster vaccine. One dose is recommended for adults aged 60 years or older unless certain conditions are present.  Measles, mumps, and rubella (MMR) vaccine. Adults born before 1957 generally are considered immune to measles and mumps. Adults born in 1957 or later should have 1 or more doses of MMR vaccine unless there is a contraindication to the vaccine or there is laboratory evidence of immunity to each of the three diseases. A routine second dose of MMR vaccine should be obtained at least 28 days after the first dose for students attending postsecondary schools, health care workers, or international travelers. People who received inactivated measles vaccine or an unknown type of measles vaccine during 1963 1967 should receive 2 doses of MMR vaccine. People who received inactivated mumps vaccine or an unknown type of mumps vaccine  before 1979 and are at high risk for mumps infection should consider immunization with 2 doses of MMR vaccine. For females of childbearing age, rubella immunity should be determined. If there is no evidence of immunity, females who are not pregnant should be vaccinated. If there   is no evidence of immunity, females who are pregnant should delay immunization until after pregnancy. Unvaccinated health care workers born before 1957 who lack laboratory evidence of measles, mumps, or rubella immunity or laboratory confirmation of disease should consider measles and mumps immunization with 2 doses of MMR vaccine or rubella immunization with 1 dose of MMR vaccine.  Pneumococcal 13-valent conjugate (PCV13) vaccine. When indicated, a person who is uncertain of her immunization history and has no record of immunization should receive the PCV13 vaccine. An adult aged 19 years or older who has certain medical conditions and has not been previously immunized should receive 1 dose of PCV13 vaccine. This PCV13 should be followed with a dose of pneumococcal polysaccharide (PPSV23) vaccine. The PPSV23 vaccine dose should be obtained at least 8 weeks after the dose of PCV13 vaccine. An adult aged 19 years or older who has certain medical conditions and previously received 1 or more doses of PPSV23 vaccine should receive 1 dose of PCV13. The PCV13 vaccine dose should be obtained 1 or more years after the last PPSV23 vaccine dose.  Pneumococcal polysaccharide (PPSV23) vaccine. When PCV13 is also indicated, PCV13 should be obtained first. All adults aged 65 years and older should be immunized. An adult younger than age 65 years who has certain medical conditions should be immunized. Any person who resides in a nursing home or long-term care facility should be immunized. An adult smoker should be immunized. People with an immunocompromised condition and certain other conditions should receive both PCV13 and PPSV23 vaccines. People  with human immunodeficiency virus (HIV) infection should be immunized as soon as possible after diagnosis. Immunization during chemotherapy or radiation therapy should be avoided. Routine use of PPSV23 vaccine is not recommended for American Indians, [place] Natives, or people younger than 65 years unless there are medical conditions that require PPSV23 vaccine. When indicated, people who have unknown immunization and have no record of immunization should receive PPSV23 vaccine. One-time revaccination 5 years after the first dose of PPSV23 is recommended for people aged 19 64 years who have chronic kidney failure, nephrotic syndrome, asplenia, or immunocompromised conditions. People who received 1 2 doses of PPSV23 before age 65 years should receive another dose of PPSV23 vaccine at age 65 years or later if at least 5 years have passed since the previous dose. Doses of PPSV23 are not needed for people immunized with PPSV23 at or after age 65 years.  Meningococcal vaccine. Adults with asplenia or persistent complement component deficiencies should receive 2 doses of quadrivalent meningococcal conjugate (MenACWY-D) vaccine. The doses should be obtained at least 2 months apart. Microbiologists working with certain meningococcal bacteria, military recruits, people at risk during an outbreak, and people who travel to or live in countries with a high rate of meningitis should be immunized. A first-year college student up through age 21 years who is living in a residence hall should receive a dose if she did not receive a dose on or after her 16th birthday. Adults who have certain high-risk conditions should receive one or more doses of vaccine.  Hepatitis A vaccine. Adults who wish to be protected from this disease, have certain high-risk conditions, work with hepatitis A-infected animals, work in hepatitis A research labs, or travel to or work in countries with a high rate of hepatitis A should be immunized. Adults  who were previously unvaccinated and who anticipate close contact with an international adoptee during the first 60 days after arrival in the [place] from a country   with a high rate of hepatitis A should be immunized.  Hepatitis B vaccine. Adults who wish to be protected from this disease, have certain high-risk conditions, may be exposed to blood or other infectious body fluids, are household contacts or sex partners of hepatitis B positive people, are clients or workers in certain care facilities, or travel to or work in countries with a high rate of hepatitis B should be immunized.  Haemophilus influenzae type b (Hib) vaccine. A previously unvaccinated person with asplenia or sickle cell disease or having a scheduled splenectomy should receive 1 dose of Hib vaccine. Regardless of previous immunization, a recipient of a hematopoietic stem cell transplant should receive a 3-dose series 6 12 months after her successful transplant. Hib vaccine is not recommended for adults with HIV infection. Preventive Services / Frequency Ages 19 to 39  Blood pressure check.** / Every 1 to 2 years.  Lipid and cholesterol check.** / Every 5 years beginning at age 20.  Clinical breast exam.** / Every 3 years for women in their 20s and 30s.  BRCA-related cancer risk assessment.** / For women who have family members with a BRCA-related cancer (breast, ovarian, tubal, or peritoneal cancers).  Pap test.** / Every 2 years from ages 21 through 29. Every 3 years starting at age 30 through age 65 or 70 with a history of 3 consecutive normal Pap tests.  HPV screening.** / Every 3 years from ages 30 through ages 65 to 70 with a history of 3 consecutive normal Pap tests.  Hepatitis C blood test.** / For any individual with known risks for hepatitis C.  Skin self-exam. / Monthly.  Influenza vaccine. / Every year.  Tetanus, diphtheria, and acellular pertussis (Tdap, Td) vaccine.** / Consult your caregiver. Pregnant  women should receive 1 dose of Tdap vaccine during each pregnancy. 1 dose of Td every 10 years.  Varicella vaccine.** / Consult your caregiver. Pregnant females who do not have evidence of immunity should receive the first dose after pregnancy.  HPV vaccine. / 3 doses over 6 months, if 26 and younger. The vaccine is not recommended for use in pregnant females. However, pregnancy testing is not needed before receiving a dose.  Measles, mumps, rubella (MMR) vaccine.** / You need at least 1 dose of MMR if you were born in 1957 or later. You may also need a 2nd dose. For females of childbearing age, rubella immunity should be determined. If there is no evidence of immunity, females who are not pregnant should be vaccinated. If there is no evidence of immunity, females who are pregnant should delay immunization until after pregnancy.  Pneumococcal 13-valent conjugate (PCV13) vaccine.** / Consult your caregiver.  Pneumococcal polysaccharide (PPSV23) vaccine.** / 1 to 2 doses if you smoke cigarettes or if you have certain conditions.  Meningococcal vaccine.** / 1 dose if you are age 19 to 21 years and a first-year college student living in a residence hall, or have one of several medical conditions, you need to get vaccinated against meningococcal disease. You may also need additional booster doses.  Hepatitis A vaccine.** / Consult your caregiver.  Hepatitis B vaccine.** / Consult your caregiver.  Haemophilus influenzae type b (Hib) vaccine.** / Consult your caregiver. Ages 40 to 64  Blood pressure check.** / Every 1 to 2 years.  Lipid and cholesterol check.** / Every 5 years beginning at age 20.  Lung cancer screening. / Every year if you are aged 55 80 years and have a 30-pack-year history of smoking and   currently smoke or have quit within the past 15 years. Yearly screening is stopped once you have quit smoking for at least 15 years or develop a health problem that would prevent you from having  lung cancer treatment.  Clinical breast exam.** / Every year after age 40.  BRCA-related cancer risk assessment.** / For women who have family members with a BRCA-related cancer (breast, ovarian, tubal, or peritoneal cancers).  Mammogram.** / Every year beginning at age 40 and continuing for as long as you are in good health. Consult with your caregiver.  Pap test.** / Every 3 years starting at age 30 through age 65 or 70 with a history of 3 consecutive normal Pap tests.  HPV screening.** / Every 3 years from ages 30 through ages 65 to 70 with a history of 3 consecutive normal Pap tests.  Fecal occult blood test (FOBT) of stool. / Every year beginning at age 50 and continuing until age 75. You may not need to do this test if you get a colonoscopy every 10 years.  Flexible sigmoidoscopy or colonoscopy.** / Every 5 years for a flexible sigmoidoscopy or every 10 years for a colonoscopy beginning at age 50 and continuing until age 75.  Hepatitis C blood test.** / For all people born from 1945 through 1965 and any individual with known risks for hepatitis C.  Skin self-exam. / Monthly.  Influenza vaccine. / Every year.  Tetanus, diphtheria, and acellular pertussis (Tdap/Td) vaccine.** / Consult your caregiver. Pregnant women should receive 1 dose of Tdap vaccine during each pregnancy. 1 dose of Td every 10 years.  Varicella vaccine.** / Consult your caregiver. Pregnant females who do not have evidence of immunity should receive the first dose after pregnancy.  Zoster vaccine.** / 1 dose for adults aged 60 years or older.  Measles, mumps, rubella (MMR) vaccine.** / You need at least 1 dose of MMR if you were born in 1957 or later. You may also need a 2nd dose. For females of childbearing age, rubella immunity should be determined. If there is no evidence of immunity, females who are not pregnant should be vaccinated. If there is no evidence of immunity, females who are pregnant should delay  immunization until after pregnancy.  Pneumococcal 13-valent conjugate (PCV13) vaccine.** / Consult your caregiver.  Pneumococcal polysaccharide (PPSV23) vaccine.** / 1 to 2 doses if you smoke cigarettes or if you have certain conditions.  Meningococcal vaccine.** / Consult your caregiver.  Hepatitis A vaccine.** / Consult your caregiver.  Hepatitis B vaccine.** / Consult your caregiver.  Haemophilus influenzae type b (Hib) vaccine.** / Consult your caregiver. Ages 65 and over  Blood pressure check.** / Every 1 to 2 years.  Lipid and cholesterol check.** / Every 5 years beginning at age 20.  Lung cancer screening. / Every year if you are aged 55 80 years and have a 30-pack-year history of smoking and currently smoke or have quit within the past 15 years. Yearly screening is stopped once you have quit smoking for at least 15 years or develop a health problem that would prevent you from having lung cancer treatment.  Clinical breast exam.** / Every year after age 40.  BRCA-related cancer risk assessment.** / For women who have family members with a BRCA-related cancer (breast, ovarian, tubal, or peritoneal cancers).  Mammogram.** / Every year beginning at age 40 and continuing for as long as you are in good health. Consult with your caregiver.  Pap test.** / Every 3 years starting at age   30 through age 65 or 70 with a 3 consecutive normal Pap tests. Testing can be stopped between 65 and 70 with 3 consecutive normal Pap tests and no abnormal Pap or HPV tests in the past 10 years.  HPV screening.** / Every 3 years from ages 30 through ages 65 or 70 with a history of 3 consecutive normal Pap tests. Testing can be stopped between 65 and 70 with 3 consecutive normal Pap tests and no abnormal Pap or HPV tests in the past 10 years.  Fecal occult blood test (FOBT) of stool. / Every year beginning at age 50 and continuing until age 75. You may not need to do this test if you get a colonoscopy  every 10 years.  Flexible sigmoidoscopy or colonoscopy.** / Every 5 years for a flexible sigmoidoscopy or every 10 years for a colonoscopy beginning at age 50 and continuing until age 75.  Hepatitis C blood test.** / For all people born from 1945 through 1965 and any individual with known risks for hepatitis C.  Osteoporosis screening.** / A one-time screening for women ages 65 and over and women at risk for fractures or osteoporosis.  Skin self-exam. / Monthly.  Influenza vaccine. / Every year.  Tetanus, diphtheria, and acellular pertussis (Tdap/Td) vaccine.** / 1 dose of Td every 10 years.  Varicella vaccine.** / Consult your caregiver.  Zoster vaccine.** / 1 dose for adults aged 60 years or older.  Pneumococcal 13-valent conjugate (PCV13) vaccine.** / Consult your caregiver.  Pneumococcal polysaccharide (PPSV23) vaccine.** / 1 dose for all adults aged 65 years and older.  Meningococcal vaccine.** / Consult your caregiver.  Hepatitis A vaccine.** / Consult your caregiver.  Hepatitis B vaccine.** / Consult your caregiver.  Haemophilus influenzae type b (Hib) vaccine.** / Consult your caregiver. ** Family history and personal history of risk and conditions may change your caregiver's recommendations. Document Released: 04/26/2001 Document Revised: 06/25/2012 Document Reviewed: 07/26/2010 [employer] Patient Information 2014 [employer].  

## 2013-02-05 NOTE — Assessment & Plan Note (Signed)
Pt is requesting to see hematology to see if she can come off coumadin and go on alternative med--ie xaralto We need to get records from [place]--prob in archives-- so hematology can review

## 2013-02-05 NOTE — Assessment & Plan Note (Signed)
Stable. Refill meds

## 2013-02-05 NOTE — Progress Notes (Signed)
Subjective:     Barbara Thomas is a 62 y.o. female and is here for a comprehensive physical exam. The patient reports problems - hx PE / dvt-- she is interested in seeing someone about possiblity of taking something other than coumadin. Pt was told she tested + for lupus anticoagulant in [place].    History   Social History  . Marital Status: Married    Spouse Name: N/A    Number of Children: N/A  . Years of Education: N/A   Occupational History  . housewife    Social History Main Topics  . Smoking status: Never Smoker   . Smokeless tobacco: Never Used  . Alcohol Use: No  . Drug Use: No  . Sexual Activity: Yes    Partners: Male   Other Topics Concern  . Not on file   Social History Narrative   Exercise--- walking--- qd short distance   Health Maintenance  Topic Date Due  . Tetanus/tdap  11/08/1970  . Colonoscopy  06/02/2013  . Influenza Vaccine  10/12/2013  . Pap Smear  06/03/2014  . Mammogram  01/05/2015  . Zostavax  Completed    The following portions of the patient's history were reviewed and updated as appropriate:  She  has a past medical history of Migraines and DVT (deep venous thrombosis). She  does not have any pertinent problems on file. She  has past surgical history that includes Breast biopsy; Vaginal hysterectomy; Skin cancer excision; Foot surgery; Hip pinning; Bladder suspension; and Vein Surgery (02/2010). Her family history includes Cancer in her father; Dementia in her mother; Diabetes in her brother; Hypertension in her father and mother; Stroke in her paternal grandmother; Sudden death in her maternal grandmother; Transient ischemic attack in her mother. She  reports that she has never smoked. She has never used smokeless tobacco. She reports that she does not drink alcohol or use illicit drugs. She has a current medication list which includes the following prescription(s): calcium carbonate, loratadine, multivitamin, pantoprazole, vitamin d  (ergocalciferol), warfarin, and tramadol. Current Outpatient Prescriptions on File Prior to Visit  Medication Sig Dispense Refill  . calcium carbonate (OS-CAL) 600 MG TABS Take 600 mg by mouth 2 (two) times daily with a meal.        . loratadine (CLARITIN) 10 MG tablet Take 10 mg by mouth daily.      . Multiple Vitamin (MULTIVITAMIN) tablet Take 1 tablet by mouth daily.        . Vitamin D, Ergocalciferol, (DRISDOL) 50000 UNITS CAPS capsule TAKE ONE CAPSULE BY MOUTH ONCE A WEEK-  12 capsule  0  . warfarin (COUMADIN) 5 MG tablet TAKE ONE TABLET BY MOUTH ONCE DAILY  30 tablet  0   No current facility-administered medications on file prior to visit.   She is allergic to penicillins..  Review of Systems Review of Systems  Constitutional: Negative for activity change, appetite change and fatigue.  HENT: Negative for hearing loss, congestion, tinnitus and ear discharge.  dentist q103m Eyes: Negative for visual disturbance (see optho q1y -- vision corrected to 20/20 with glasses).  Respiratory: Negative for cough, chest tightness and shortness of breath.   Cardiovascular: Negative for chest pain, palpitations and leg swelling.  Gastrointestinal: Negative for abdominal pain, diarrhea, constipation and abdominal distention.  Genitourinary: Negative for urgency, frequency, decreased urine volume and difficulty urinating.  Musculoskeletal: Negative for back pain, arthralgias and gait problem.  Skin: Negative for color change, pallor and rash.  Neurological: Negative for dizziness, light-headedness, numbness  and headaches.  Hematological: Negative for adenopathy. Does not bruise/bleed easily.  Psychiatric/Behavioral: Negative for suicidal ideas, confusion, sleep disturbance, self-injury, dysphoric mood, decreased concentration and agitation.       Objective:    BP 124/72  Pulse 94  Resp 16  Ht 5\' 10"  (1.778 m)  Wt 222 lb 9.6 oz (100.971 kg)  BMI 31.94 kg/m2  SpO2 94% General appearance:  alert, cooperative, appears stated age and no distress Head: Normocephalic, without obvious abnormality, atraumatic Eyes: conjunctivae/corneas clear. PERRL, EOM's intact. Fundi benign. Ears: normal TM's and external ear canals both ears Nose: Nares normal. Septum midline. Mucosa normal. No drainage or sinus tenderness. Throat: lips, mucosa, and tongue normal; teeth and gums normal Neck: no adenopathy, supple, symmetrical, trachea midline and thyroid not enlarged, symmetric, no tenderness/mass/nodules Back: symmetric, no curvature. ROM normal. No CVA tenderness. Lungs: clear to auscultation bilaterally Breasts: normal appearance, no masses or tenderness Heart: regular rate and rhythm, S1, S2 normal, no murmur, click, rub or gallop Abdomen: soft, non-tender; bowel sounds normal; no masses,  no organomegaly Pelvic: not indicated; status post hysterectomy, negative ROS Extremities: extremities normal, atraumatic, no cyanosis or edema Pulses: 2+ and symmetric Skin: Skin color, texture, turgor normal. No rashes or lesions Lymph nodes: Cervical, supraclavicular, and axillary nodes normal. Neurologic: Alert and oriented X 3, normal strength and tone. Normal symmetric reflexes. Normal coordination and gait Psych-- no depression, no anxiety      Assessment:    Healthy female exam.     Plan:    ghm utd  See After Visit Summary for Counseling Recommendations

## 2013-02-05 NOTE — Progress Notes (Signed)
Pre visit review using our clinic review tool, if applicable. No additional management support is needed unless otherwise documented below in the visit note. 

## 2013-02-11 ENCOUNTER — Ambulatory Visit (INDEPENDENT_AMBULATORY_CARE_PROVIDER_SITE_OTHER): Payer: Managed Care, Other (non HMO) | Admitting: *Deleted

## 2013-02-11 VITALS — BP 136/80 | HR 100 | Temp 98.5°F | Wt 226.6 lb

## 2013-02-11 DIAGNOSIS — I82403 Acute embolism and thrombosis of unspecified deep veins of lower extremity, bilateral: Secondary | ICD-10-CM

## 2013-02-11 DIAGNOSIS — Z7901 Long term (current) use of anticoagulants: Secondary | ICD-10-CM

## 2013-02-11 DIAGNOSIS — I82409 Acute embolism and thrombosis of unspecified deep veins of unspecified lower extremity: Secondary | ICD-10-CM

## 2013-02-11 NOTE — Patient Instructions (Signed)
Hold today dose (Monday) resume regular dose on Tuesday. Recheck in 1 week.

## 2013-02-18 ENCOUNTER — Other Ambulatory Visit (INDEPENDENT_AMBULATORY_CARE_PROVIDER_SITE_OTHER): Payer: Managed Care, Other (non HMO)

## 2013-02-18 ENCOUNTER — Ambulatory Visit (INDEPENDENT_AMBULATORY_CARE_PROVIDER_SITE_OTHER): Payer: Managed Care, Other (non HMO) | Admitting: *Deleted

## 2013-02-18 DIAGNOSIS — I82409 Acute embolism and thrombosis of unspecified deep veins of unspecified lower extremity: Secondary | ICD-10-CM

## 2013-02-18 LAB — POCT INR: INR: 2.5

## 2013-02-18 NOTE — Progress Notes (Unsigned)
No missed doses Current dose: 5mg  daily except Monday. Monday 2.5 mg No bleeding, no bruising

## 2013-02-18 NOTE — Patient Instructions (Signed)
Continue taking current dose of warfarin 5 mg per day expect mondays, take 2.5 mg warfarin on Monday. Recheck INR in two weeks. Appt was scheduled for 03/04/13 for 2:15 pm.

## 2013-02-22 ENCOUNTER — Ambulatory Visit: Payer: Managed Care, Other (non HMO)

## 2013-03-04 ENCOUNTER — Ambulatory Visit (INDEPENDENT_AMBULATORY_CARE_PROVIDER_SITE_OTHER): Payer: Managed Care, Other (non HMO) | Admitting: *Deleted

## 2013-03-04 VITALS — BP 122/80 | HR 89 | Temp 98.1°F | Wt 224.2 lb

## 2013-03-04 DIAGNOSIS — I82403 Acute embolism and thrombosis of unspecified deep veins of lower extremity, bilateral: Secondary | ICD-10-CM

## 2013-03-04 DIAGNOSIS — I82409 Acute embolism and thrombosis of unspecified deep veins of unspecified lower extremity: Secondary | ICD-10-CM

## 2013-03-04 DIAGNOSIS — I2699 Other pulmonary embolism without acute cor pulmonale: Secondary | ICD-10-CM

## 2013-03-04 DIAGNOSIS — Z7901 Long term (current) use of anticoagulants: Secondary | ICD-10-CM

## 2013-03-04 LAB — POCT INR: INR: 2.5

## 2013-03-04 NOTE — Patient Instructions (Signed)
Resume normal dosage. 5mg  daily except on Monday 2.5mg . Recheck in 4 weeks

## 2013-03-11 ENCOUNTER — Telehealth: Payer: Self-pay | Admitting: Hematology & Oncology

## 2013-03-11 NOTE — Telephone Encounter (Signed)
Left vm w NEW PATIENT today to remind them of their appointment with Dr. Ennever. Also, advised them to bring all meds and insurance information. ° °

## 2013-03-13 ENCOUNTER — Ambulatory Visit: Payer: Managed Care, Other (non HMO)

## 2013-03-13 ENCOUNTER — Ambulatory Visit (HOSPITAL_BASED_OUTPATIENT_CLINIC_OR_DEPARTMENT_OTHER): Payer: Managed Care, Other (non HMO) | Admitting: Hematology & Oncology

## 2013-03-13 ENCOUNTER — Other Ambulatory Visit (HOSPITAL_BASED_OUTPATIENT_CLINIC_OR_DEPARTMENT_OTHER): Payer: Managed Care, Other (non HMO) | Admitting: Lab

## 2013-03-13 ENCOUNTER — Ambulatory Visit (HOSPITAL_BASED_OUTPATIENT_CLINIC_OR_DEPARTMENT_OTHER)
Admission: RE | Admit: 2013-03-13 | Discharge: 2013-03-13 | Disposition: A | Discharge status: Home / Self Care | Payer: Managed Care, Other (non HMO) | Source: Ambulatory Visit | Attending: Hematology & Oncology | Admitting: Hematology & Oncology

## 2013-03-13 VITALS — BP 149/69 | HR 86 | Temp 97.7°F | Resp 14 | Ht 70.0 in | Wt 224.0 lb

## 2013-03-13 DIAGNOSIS — Z86718 Personal history of other venous thrombosis and embolism: Secondary | ICD-10-CM | POA: Insufficient documentation

## 2013-03-13 DIAGNOSIS — I82403 Acute embolism and thrombosis of unspecified deep veins of lower extremity, bilateral: Secondary | ICD-10-CM

## 2013-03-13 LAB — CBC WITH DIFFERENTIAL (CANCER CENTER ONLY)
BASO#: 0 10*3/uL (ref 0.0–0.2)
EOS%: 2.7 % (ref 0.0–7.0)
Eosinophils Absolute: 0.2 10*3/uL (ref 0.0–0.5)
HCT: 43.5 % (ref 34.8–46.6)
HGB: 14.1 g/dL (ref 11.6–15.9)
LYMPH#: 1.3 10*3/uL (ref 0.9–3.3)
LYMPH%: 23.5 % (ref 14.0–48.0)
MCH: 28.1 pg (ref 26.0–34.0)
MCHC: 32.4 g/dL (ref 32.0–36.0)
MONO%: 8.5 % (ref 0.0–13.0)
NEUT#: 3.6 10*3/uL (ref 1.5–6.5)
Platelets: 225 10*3/uL (ref 145–400)
RBC: 5.02 10*6/uL (ref 3.70–5.32)
RDW: 14.2 % (ref 11.1–15.7)

## 2013-03-15 LAB — LUPUS ANTICOAGULANT PANEL
DRVVT 1:1 Mix: 36.1 secs (ref <42.9)
DRVVT: 49.3 secs — ABNORMAL HIGH (ref <42.9)
Lupus Anticoagulant: NOT DETECTED
PTT Lupus Anticoagulant: 42.1 secs (ref 28.0–43.0)

## 2013-03-15 LAB — CARDIOLIPIN ANTIBODIES, IGG, IGM, IGA
Anticardiolipin IgA: 4 APL U/mL (ref <22)
Anticardiolipin IgG: 14 GPL U/mL (ref <23)
Anticardiolipin IgM: 2 MPL U/mL (ref <11)

## 2013-03-15 NOTE — Progress Notes (Signed)
This office note has been dictated.

## 2013-03-16 NOTE — Progress Notes (Signed)
CC:   Barbara Chessman, DO  DIAGNOSIS:  Bilateral lower extremity deep venous thrombosis/pulmonary embolism post foot surgery 3 years ago.  HISTORY OF PRESENT ILLNESS:  Barbara Thomas is a very nice 63 year old white female.  She and her husband have moved down here from [place].  They have been down here probably about 2 years.  About 3 years ago, she is not sure of the exact time, she apparently misstepped and fell and broke her foot.  She had extensive surgery for this.  This is up in [place].  I think she lived in [place], [place] at that time.  She was put on some I think IV prophylactic anticoagulation.  This was subsequently stopped.  She then developed I think bilateral DVT and pulmonary embolism.  She has been on Coumadin.  Her husband moved down here.  She has been on Coumadin since this event.  She apparently was tested positive for lupus anticoagulant at that time.  She saw one of vascular surgeon in town after she came down.  He did Dopplers on her leg.  The Dopplers were done actually on October of 2012.  This showed a bilateral areas of DVT.  These, I think appeared to be more chronic.  She was told that she would need lifelong anticoagulation.  She does not have a IVC filter in place.  Again, Dr. Oneida Alar, Vascular Surgeon stated that she needed to be on lifelong Coumadin.  She sees Dr. Garnet Koyanagi.  Dr. Etter Sjogren kindly referred her to the [facility] to see if she truly needed anticoagulation for life.  Barbara Thomas is not sure what other studies were done in [place] for her blood clots.  Again, she was told that she had a lupus anticoagulant.  She has really had no problems with anticoagulation.  She has had no bleeding.  She started having her blood checked all the time.  She has had no cough or shortness of breath.  She has gained little weight, but she says she will try to lose this with holidays being over with.  She has had past surgeries.  She has  had no problem with thromboembolic disease after these past surgeries.  She apparently had a episode of superficial thrombophlebitis.  We did go ahead and do a Doppler of her legs today.  These, thankfully, showed no evidence of bilateral lower extremity DVTs.  PAST MEDICAL HISTORY:  Remarkable for; 1. GERD. 2. Foot surgery secondary to trauma. 3. Breast biopsy for benign condition. 4. Vaginal hysterectomy. 5. Bladder suspension. 6. Vein surgery.  ALLERGIES:  Penicillin.  MEDICATIONS:  Claritin 10 mg p.o. daily p.r.n., multivitamins daily, Protonix 40 mg daily, Ultram 50 mg p.o. q.6 hours p.r.n., vitamin D 50,000 units weekly, and Coumadin 5 mg p.o. daily.  SOCIAL HISTORY:  Negative for tobacco use.  There is rare alcohol use. She has no obvious occupational exposures.  FAMILY HISTORY:  Negative for any type of thromboembolic disease.  There is history of CVA, hypertension, "cancer," and diabetes.  REVIEW OF SYSTEMS:  As stated in history of present illness.  No additional findings noted on 12-system review.  PHYSICAL EXAMINATION:  General:  This is a fairly well-developed, well- nourished white female, in no obvious distress.  Vital Signs: Temperature of 97.7, pulse 86, respiratory rate 14, blood pressure 149/69, weight is 224 pounds.  Head and Neck:  Normocephalic, atraumatic skull.  She has no ocular or oral lesions.  There are no palpable cervical or supraclavicular lymph  nodes.  Lungs:  Clear bilaterally. Cardiac:  Regular rate and rhythm with a normal S1 and S2.  She has no murmurs, rubs, or bruits.  Abdomen:  Soft.  She has good bowel sounds. There is no fluid wave.  There is no palpable abdominal mass.  There is no palpable hepatosplenomegaly.  Back:  No tenderness over the spine, ribs, or hips.  Extremities:  Show no clubbing, cyanosis, or edema.  She has no palpable venous cord in the legs. She has good range of motion of her joints.  She has good strength  in her arms and legs.  Skin:  No rashes, ecchymoses, or petechia. Neurological:  Shows no focal neurological deficits.  Skin:  Again, no rashes, ecchymosis, or petechia.  LABORATORY STUDIES:  White cell count is 5.5, hemoglobin 14, hematocrit 43.5, platelet count 225.  Lupus anticoagulant is negative.  She has a normal anticardiolipin antibodies.  IMPRESSION AND PLAN:  Barbara Thomas is a 64 year old white female.  She had a history of post cervical thromboembolic event.  She had a transient positive lupus anticoagulant.  I think this is a false-positive.  She was never followed up.  Again, for lupus anticoagulant positivity, you need to have 2 separate positive tests and a confirmatory test.  She tested negative today.  We repeated her Doppler test.  There is no DVTs that were noted.  I believe that she need to go on aspirin now.  I think 2 baby aspirin a day would be appropriate for her.  I think her risk of recurrent thromboembolic disease is less than 10%.  I spent a good hour with Barbara Thomas.  Again, I do not see that she needs a lifelong anticoagulation.  I think the risk of bleeding would far outweigh the benefits that she would have from chronic anticoagulation.  Barbara Thomas was quite pleased knowing that she does not need to be on Coumadin.  She has no problems taking the baby aspirin.  I told Barbara Thomas that we can certainly get her back at her convenience. Again, from my point of view, I just do not see that we need to see her as we are just not had anything to her medical care.  I told Barbara Thomas to make sure she drinks a lot of water.  I also told her to make sure if she goes on long trips that she gets up every hour or so to walk around.    ______________________________ Volanda Napoleon, M.D. PRE/MEDQ  D:  03/15/2013  T:  03/16/2013  Job:  [id number]

## 2013-03-27 ENCOUNTER — Other Ambulatory Visit: Payer: Self-pay | Admitting: Family Medicine

## 2013-03-28 NOTE — Telephone Encounter (Signed)
12/14/12 Vitamin D levels were normal. Please advise if refill appropriate.     KP

## 2013-04-01 ENCOUNTER — Ambulatory Visit: Payer: Managed Care, Other (non HMO)

## 2013-06-24 ENCOUNTER — Other Ambulatory Visit: Payer: Self-pay | Admitting: Family Medicine

## 2013-06-24 NOTE — Telephone Encounter (Signed)
Vitamin D labs last done on 12/14/12  was a 60. Please advise if refill is appropriate.        KP

## 2013-09-24 ENCOUNTER — Other Ambulatory Visit: Payer: Self-pay | Admitting: Family Medicine

## 2013-10-29 ENCOUNTER — Other Ambulatory Visit: Payer: Self-pay | Admitting: Family Medicine

## 2013-11-04 ENCOUNTER — Telehealth: Payer: Self-pay | Admitting: Family Medicine

## 2013-11-04 MED ORDER — VITAMIN D (ERGOCALCIFEROL) 1.25 MG (50000 UNIT) PO CAPS: ORAL_CAPSULE | ORAL | Status: DC

## 2013-11-04 NOTE — Telephone Encounter (Signed)
Last seen 02/05/14 and Vitamin D checked 12/14/12 levels were 59. Please advise if refill is appropriate.     KP

## 2013-11-04 NOTE — Telephone Encounter (Signed)
Rx faxed.    KP 

## 2013-11-04 NOTE — Telephone Encounter (Signed)
Refill x1   2 refills 

## 2013-11-04 NOTE — Telephone Encounter (Signed)
Caller name: Justyce Relation to pt: Call back number: [phone] Pharmacy: [facility]  Reason for call:  Pt wants a refill on rx Vitamin D, Ergocalciferol, (DRISDOL) 50000 UNITS CAPS capsule, and would like to have refills available too.

## 2013-11-21 ENCOUNTER — Emergency Department (HOSPITAL_COMMUNITY)
Admission: EM | Admit: 2013-11-21 | Discharge: 2013-11-21 | Disposition: A | Discharge status: Home / Self Care | Payer: Managed Care, Other (non HMO) | Attending: Emergency Medicine | Admitting: Emergency Medicine

## 2013-11-21 ENCOUNTER — Emergency Department (HOSPITAL_COMMUNITY): Payer: Managed Care, Other (non HMO)

## 2013-11-21 ENCOUNTER — Other Ambulatory Visit: Payer: Self-pay | Admitting: Urology

## 2013-11-21 ENCOUNTER — Encounter (HOSPITAL_BASED_OUTPATIENT_CLINIC_OR_DEPARTMENT_OTHER): Payer: Self-pay | Admitting: *Deleted

## 2013-11-21 ENCOUNTER — Encounter (HOSPITAL_COMMUNITY): Payer: Self-pay | Admitting: Emergency Medicine

## 2013-11-21 DIAGNOSIS — G4733 Obstructive sleep apnea (adult) (pediatric): Secondary | ICD-10-CM | POA: Insufficient documentation

## 2013-11-21 DIAGNOSIS — Z79899 Other long term (current) drug therapy: Secondary | ICD-10-CM | POA: Insufficient documentation

## 2013-11-21 DIAGNOSIS — Z9981 Dependence on supplemental oxygen: Secondary | ICD-10-CM | POA: Diagnosis not present

## 2013-11-21 DIAGNOSIS — Z8669 Personal history of other diseases of the nervous system and sense organs: Secondary | ICD-10-CM | POA: Diagnosis not present

## 2013-11-21 DIAGNOSIS — N23 Unspecified renal colic: Secondary | ICD-10-CM

## 2013-11-21 DIAGNOSIS — R1032 Left lower quadrant pain: Secondary | ICD-10-CM | POA: Insufficient documentation

## 2013-11-21 DIAGNOSIS — Z880 Allergy status to penicillin: Secondary | ICD-10-CM | POA: Insufficient documentation

## 2013-11-21 DIAGNOSIS — Z7982 Long term (current) use of aspirin: Secondary | ICD-10-CM | POA: Diagnosis not present

## 2013-11-21 DIAGNOSIS — Z86718 Personal history of other venous thrombosis and embolism: Secondary | ICD-10-CM | POA: Diagnosis not present

## 2013-11-21 HISTORY — DX: Dependence on other enabling machines and devices: Z99.89

## 2013-11-21 HISTORY — DX: Obstructive sleep apnea (adult) (pediatric): G47.33

## 2013-11-21 LAB — URINALYSIS, ROUTINE W REFLEX MICROSCOPIC
BILIRUBIN URINE: NEGATIVE
Glucose, UA: NEGATIVE mg/dL
KETONES UR: NEGATIVE mg/dL
Nitrite: NEGATIVE
Protein, ur: 30 mg/dL — AB
Specific Gravity, Urine: 1.024 (ref 1.005–1.030)
UROBILINOGEN UA: 0.2 mg/dL (ref 0.0–1.0)
pH: 7 (ref 5.0–8.0)

## 2013-11-21 LAB — CBC WITH DIFFERENTIAL/PLATELET
Basophils Absolute: 0 10*3/uL (ref 0.0–0.1)
Basophils Relative: 0 % (ref 0–1)
EOS ABS: 0.1 10*3/uL (ref 0.0–0.7)
Eosinophils Relative: 1 % (ref 0–5)
HCT: 41.5 % (ref 36.0–46.0)
Hemoglobin: 13.7 g/dL (ref 12.0–15.0)
Lymphocytes Relative: 10 % — ABNORMAL LOW (ref 12–46)
Lymphs Abs: 1.1 10*3/uL (ref 0.7–4.0)
MCH: 27.9 pg (ref 26.0–34.0)
MCHC: 33 g/dL (ref 30.0–36.0)
MCV: 84.5 fL (ref 78.0–100.0)
MONOS PCT: 6 % (ref 3–12)
Monocytes Absolute: 0.6 10*3/uL (ref 0.1–1.0)
Neutro Abs: 9.2 10*3/uL — ABNORMAL HIGH (ref 1.7–7.7)
Neutrophils Relative %: 83 % — ABNORMAL HIGH (ref 43–77)
PLATELETS: 272 10*3/uL (ref 150–400)
RBC: 4.91 MIL/uL (ref 3.87–5.11)
RDW: 13.5 % (ref 11.5–15.5)
WBC: 11 10*3/uL — ABNORMAL HIGH (ref 4.0–10.5)

## 2013-11-21 LAB — URINE MICROSCOPIC-ADD ON

## 2013-11-21 LAB — COMPREHENSIVE METABOLIC PANEL
ALT: 29 U/L (ref 0–35)
ANION GAP: 14 (ref 5–15)
AST: 21 U/L (ref 0–37)
Albumin: 4 g/dL (ref 3.5–5.2)
Alkaline Phosphatase: 106 U/L (ref 39–117)
BUN: 23 mg/dL (ref 6–23)
CO2: 22 mEq/L (ref 19–32)
CREATININE: 1.61 mg/dL — AB (ref 0.50–1.10)
Calcium: 9.8 mg/dL (ref 8.4–10.5)
Chloride: 98 mEq/L (ref 96–112)
GFR calc non Af Amer: 33 mL/min — ABNORMAL LOW (ref >90)
GFR, EST AFRICAN AMERICAN: 39 mL/min — AB (ref >90)
GLUCOSE: 160 mg/dL — AB (ref 70–99)
Potassium: 4.2 mEq/L (ref 3.7–5.3)
SODIUM: 134 meq/L — AB (ref 137–147)
TOTAL PROTEIN: 7.9 g/dL (ref 6.0–8.3)
Total Bilirubin: 0.4 mg/dL (ref 0.3–1.2)

## 2013-11-21 LAB — LIPASE, BLOOD: LIPASE: 40 U/L (ref 11–59)

## 2013-11-21 LAB — PROTIME-INR
INR: 0.95 (ref 0.00–1.49)
Prothrombin Time: 12.7 seconds (ref 11.6–15.2)

## 2013-11-21 LAB — I-STAT TROPONIN, ED: TROPONIN I, POC: 0 ng/mL (ref 0.00–0.08)

## 2013-11-21 LAB — APTT: aPTT: 26 seconds (ref 24–37)

## 2013-11-21 MED ORDER — IOHEXOL 300 MG/ML  SOLN
100.0000 mL | Freq: Once | INTRAMUSCULAR | Status: AC | PRN
  Administered 2013-11-21: 80 mL via INTRAVENOUS

## 2013-11-21 MED ORDER — ONDANSETRON HCL 4 MG/2ML IJ SOLN
4.0000 mg | Freq: Once | INTRAMUSCULAR | Status: AC
  Administered 2013-11-21: 4 mg via INTRAVENOUS
  Filled 2013-11-21: qty 2

## 2013-11-21 MED ORDER — KETOROLAC TROMETHAMINE 30 MG/ML IJ SOLN
30.0000 mg | Freq: Once | INTRAMUSCULAR | Status: AC
  Administered 2013-11-21: 30 mg via INTRAVENOUS
  Filled 2013-11-21: qty 1

## 2013-11-21 MED ORDER — ONDANSETRON 4 MG PO TBDP: ORAL_TABLET | ORAL | Status: DC

## 2013-11-21 MED ORDER — SODIUM CHLORIDE 0.9 % IV BOLUS (SEPSIS)
500.0000 mL | Freq: Once | INTRAVENOUS | Status: AC
  Administered 2013-11-21: 500 mL via INTRAVENOUS

## 2013-11-21 MED ORDER — OXYCODONE-ACETAMINOPHEN 5-325 MG PO TABS: 2.0000 | ORAL_TABLET | ORAL | Status: DC | PRN

## 2013-11-21 MED ORDER — FENTANYL CITRATE 0.05 MG/ML IJ SOLN
25.0000 ug | Freq: Once | INTRAMUSCULAR | Status: AC
  Administered 2013-11-21: 25 ug via INTRAVENOUS
  Filled 2013-11-21: qty 2

## 2013-11-21 MED ORDER — FENTANYL CITRATE 0.05 MG/ML IJ SOLN
50.0000 ug | Freq: Once | INTRAMUSCULAR | Status: AC
  Administered 2013-11-21: 50 ug via INTRAVENOUS
  Filled 2013-11-21: qty 2

## 2013-11-21 MED ORDER — KETOROLAC TROMETHAMINE 10 MG PO TABS
10.0000 mg | ORAL_TABLET | Freq: Four times a day (QID) | ORAL | Status: DC | PRN
Start: 2013-11-21 — End: 2013-11-25

## 2013-11-21 MED ORDER — MORPHINE SULFATE 4 MG/ML IJ SOLN: 4.0000 mg | Freq: Once | INTRAMUSCULAR | Status: DC

## 2013-11-21 NOTE — Discharge Instructions (Signed)

## 2013-11-21 NOTE — ED Notes (Signed)
Patient c/o LLQ pain, onset at 2000 last night. Patient reports a headache x2 days, with one episode of emesis at 0230.

## 2013-11-21 NOTE — ED Provider Notes (Signed)
CSN: [id number]     Arrival date & time 11/21/13  0320 History   First MD Initiated Contact with Patient 11/21/13 0340     Chief Complaint  Patient presents with  . Abdominal Pain    LLQ     (Consider location/radiation/quality/duration/timing/severity/associated sxs/prior Treatment) HPI Patient presents with left lower quadrant pain starting around 8:00 last evening. She describes the pain is sharp. It is associated with nausea and one episode of vomiting. She's had no diarrhea or constipation. Denies any urinary hesitancy, dysuria, hematuria states she's not had pain similar to his past. Patient also reports several days of a frontal headache. She states this is a similar headache that she's had in the past. She says is associated with her sinus congestion. She admits to nasal congestion. She's had subjective fevers and chills. Denies neck pain or stiffness. Denies vision changes, focal weakness or numbness. Past Medical History  Diagnosis Date  . Migraines   . DVT (deep venous thrombosis)   . OSA on CPAP    Past Surgical History  Procedure Laterality Date  . Breast biopsy    . Vaginal hysterectomy    . Skin cancer excision      ON NOSE  . Foot surgery    . Hip pinning    . Bladder suspension    . Vein surgery  02/2010    ECLIPSE FILTER INSERTED    Family History  Problem Relation Age of Onset  . Transient ischemic attack Mother   . Cancer Father     BLADDER CANCER  . Hypertension Mother   . Hypertension Father   . Dementia Mother   . Stroke Paternal Grandmother   . Sudden death Maternal Grandmother   . Diabetes Brother    History  Substance Use Topics  . Smoking status: Never Smoker   . Smokeless tobacco: Never Used  . Alcohol Use: No   OB History   Grav Para Term Preterm Abortions TAB SAB Ect Mult Living                 Review of Systems  Constitutional: Positive for fever, chills and fatigue.  HENT: Positive for congestion and sinus pressure.   Eyes:  Negative for visual disturbance.  Respiratory: Negative for cough and shortness of breath.   Cardiovascular: Negative for chest pain, palpitations and leg swelling.  Gastrointestinal: Positive for nausea, vomiting and abdominal pain. Negative for diarrhea, constipation and blood in stool.  Genitourinary: Negative for dysuria, hematuria, flank pain and difficulty urinating.  Musculoskeletal: Negative for back pain, myalgias, neck pain and neck stiffness.  Skin: Negative for rash and wound.  Neurological: Positive for headaches. Negative for dizziness, weakness, light-headedness and numbness.  All other systems reviewed and are negative.     Allergies  Penicillins  Home Medications   Prior to Admission medications   Medication Sig Start Date End Date Taking? Authorizing Provider  acetaminophen (TYLENOL) 500 MG tablet Take 1,000 mg by mouth every 6 (six) hours as needed for headache.   Yes Historical Provider, MD  aspirin 81 MG chewable tablet Chew 162 mg by mouth daily.   Yes Historical Provider, MD  calcium carbonate (OS-CAL) 600 MG TABS Take 600 mg by mouth 2 (two) times daily with a meal.     Yes Historical Provider, MD  guaiFENesin (MUCINEX) 600 MG 12 hr tablet Take 600 mg by mouth 2 (two) times daily as needed for cough.   Yes Historical Provider, MD  loratadine (CLARITIN) 10 MG  tablet Take 10 mg by mouth as needed.    Yes Historical Provider, MD  Multiple Vitamin (MULTIVITAMIN) tablet Take 1 tablet by mouth daily.     Yes Historical Provider, MD  pantoprazole (PROTONIX) 40 MG tablet 1 tab by mouth daily-- 02/05/13  Yes Rosalita Chessman, DO  Vitamin D, Ergocalciferol, (DRISDOL) 50000 UNITS CAPS capsule Take 50,000 Units by mouth every 7 (seven) days. Saturday   Yes Historical Provider, MD   BP 154/98  Pulse 106  Temp(Src) 98.2 F (36.8 C) (Oral)  Resp 20  Ht 5\' 10"  (1.778 m)  Wt 225 lb (102.059 kg)  BMI 32.28 kg/m2  SpO2 97% Physical Exam  Nursing note and vitals  reviewed. Constitutional: She is oriented to person, place, and time. She appears well-developed and well-nourished. No distress.  Anxious appearing  HENT:  Head: Normocephalic and atraumatic.  Mouth/Throat: Oropharynx is clear and moist.  Eyes: EOM are normal. Pupils are equal, round, and reactive to light.  Neck: Normal range of motion. Neck supple.  Cardiovascular: Normal rate and regular rhythm.   Pulmonary/Chest: Effort normal and breath sounds normal. No respiratory distress. She has no wheezes. She has no rales.  Abdominal: Soft. Bowel sounds are normal. She exhibits no distension and no mass. There is tenderness (left lower quadrant tenderness to palpation. No rebound or guarding.). There is no rebound and no guarding.  Musculoskeletal: Normal range of motion. She exhibits no edema and no tenderness.  No CVA tenderness bilaterally.  Neurological: She is alert and oriented to person, place, and time.  Moves all extremities without deficit. Sensation is grossly intact.  Skin: Skin is warm and dry. No rash noted. No erythema.  Psychiatric: She has a normal mood and affect. Her behavior is normal.    ED Course  Procedures (including critical care time) Labs Review Labs Reviewed  CBC WITH DIFFERENTIAL - Abnormal; Notable for the following:    WBC 11.0 (*)    Neutrophils Relative % 83 (*)    Neutro Abs 9.2 (*)    Lymphocytes Relative 10 (*)    All other components within normal limits  COMPREHENSIVE METABOLIC PANEL - Abnormal; Notable for the following:    Sodium 134 (*)    Glucose, Bld 160 (*)    Creatinine, Ser 1.61 (*)    GFR calc non Af Amer 33 (*)    GFR calc Af Amer 39 (*)    All other components within normal limits  URINALYSIS, ROUTINE W REFLEX MICROSCOPIC - Abnormal; Notable for the following:    APPearance CLOUDY (*)    Hgb urine dipstick LARGE (*)    Protein, ur 30 (*)    Leukocytes, UA SMALL (*)    All other components within normal limits  URINE  MICROSCOPIC-ADD ON - Abnormal; Notable for the following:    Bacteria, UA FEW (*)    All other components within normal limits  LIPASE, BLOOD  PROTIME-INR  APTT  I-STAT TROPOININ, ED    Imaging Review Ct Abdomen Pelvis W Contrast  11/21/2013   CLINICAL DATA:  Left-sided flank pain with vomiting. Left lower quadrant pain.  EXAM: CT ABDOMEN AND PELVIS WITH CONTRAST  TECHNIQUE: Multidetector CT imaging of the abdomen and pelvis was performed using the standard protocol following bolus administration of intravenous contrast.  CONTRAST:  34mL OMNIPAQUE IOHEXOL 300 MG/ML  SOLN  COMPARISON:  None.  FINDINGS: Atelectasis and/ or scarring in the lung bases. Small esophageal hiatal hernia.  The liver, spleen, gallbladder, pancreas, adrenal  glands, abdominal aorta, and retroperitoneal lymph nodes are unremarkable. Inferior vena caval filter. Delayed left renal nephrogram. Prominent hydronephrosis and hydroureter on the left. Two adjacent stones demonstrated in the mid left ureter at the level of the lumbosacral interspace, largest measuring 7 mm diameter. The distal left ureter is decompressed. No bladder stone or bladder wall thickening. Right kidney is normal. Stomach and small bowel demonstrate no wall thickening or distention. Stool-filled colon without distention. No free air or free fluid in the abdomen.  Pelvis: Left hip arthroplasty. Bladder wall is not thickened. No pelvic mass or lymphadenopathy. Appendix is normal. Scattered diverticula in the sigmoid colon without evidence of diverticulitis. No free or loculated pelvic fluid collections. No destructive bone lesions. Schmorl's node at L1.  IMPRESSION: Left ureteral stones in the mid ureter, largest measuring 7 mm, with moderate proximal obstruction.   Electronically Signed   By: Lucienne Capers M.D.   On: 11/21/2013 05:51     EKG Interpretation None      MDM   Final diagnoses:  None   Pain improved after initial dose of fentanyl. Starting to  return. And discussed with Dr. Meriam Sprague. Recommends discharge home with urology followup if her pain is well controlled. Will likely need intervention.   Patient's pain is resolved. She's been advised to return immediately for worsening vomiting, fever, persistent pain or for any concerns. To followup with Dr. Meriam Sprague as an outpatient.  Julianne Rice, MD 11/21/13 [phone]

## 2013-11-22 ENCOUNTER — Encounter (HOSPITAL_BASED_OUTPATIENT_CLINIC_OR_DEPARTMENT_OTHER): Payer: Self-pay | Admitting: *Deleted

## 2013-11-22 NOTE — Progress Notes (Signed)
NPO AFTER MN. ARRIVE AT 0745. CURRENT LAB RESULTS IN CHART AND EPIC. WILL TAKE PROTONIX AM DOS W/ SIPS OF WATER AND IF NEEDED TAKE PAIN/ NAUSEA RX.

## 2013-11-22 NOTE — H&P (Signed)
History of Present Illness   Barbara Barbara Thomas presents today with a recently diagnosed 7 mm x 12 mm mid left ureteral calculus with a questionable second calcification as well. Barbara Thomas is currently 63 years of age and really has no urologic history. Her biggest issues have been orthopedic. She also developed DVT and was on anticoagulation for a long time. She is currently on just aspirin. She developed fairly classic renal colic and was seen recently in the emergency room. There, a 7 mm stone with fairly high-grade obstruction was appreciated. I reviewed her images and she has actually a 7 mm x 12 mm calcification in the left mid ureter with a probable smaller stone just proximal to that. No obvious renal calculi bilaterally. She has continued to have some ongoing mild discomfort but is not in acute distress at this time. Urinalysis showed hematuria without any evidence of infection. Her creatinine was elevated relative to her baseline. I have reviewed her images as well as her recent hospital notes. She has had no prior episodes of nephrolithiasis. No family history.     Past Medical History Problems  1. History of deep venous thrombosis (V12.51) 2. History of migraine headaches (V12.49) 3. History of sleep apnea (V13.89)  Surgical History Problems  1. History of Bladder Surgery 2. History of Destruction Of Malignant Lesion Nose 3. History of Foot Surgery 4. History of Varicose Vein Ligation  Family History Problems  1. Family history of dementia (V17.2) : Mother 2. Family history of diabetes mellitus (V18.0) : Brother 3. Family history of hypertension (V17.49) : Mother, Father 4. Family history of malignant neoplasm of urinary bladder (Z61.09) : Father 5. Family history of stroke (V17.1) : Paternal Grandmother 23. Family history of transient ischemic attacks (V17.1) : Mother  Social History Problems    Never a smoker  Review of Systems Genitourinary, constitutional, skin, eye,  otolaryngeal, hematologic/lymphatic, cardiovascular, pulmonary, endocrine, musculoskeletal, gastrointestinal, neurological and psychiatric system(s) were reviewed and pertinent findings if present are noted.  Genitourinary: incontinence and dyspareunia.  Gastrointestinal: nausea, vomiting, flank pain and abdominal pain.  ENT: sinus problems.  Neurological: headache.    Vitals Vital Signs [Data Includes: Last 1 Day]  Recorded: 10Sep2015 10:06AM  Height: 5 ft 10 in Weight: 225 lb  BMI Calculated: 32.28 BSA Calculated: 2.19 Blood Pressure: 129 / 79 Temperature: 97.4 F Heart Rate: 91  Physical Exam Constitutional: Well nourished and well developed . No acute distress.  ENT:. The ears and nose are normal in appearance.  Neck: The appearance of the neck is normal and no neck mass is present.  Pulmonary: No respiratory distress and normal respiratory rhythm and effort.  Cardiovascular: Heart rate and rhythm are normal . No peripheral edema.  Abdomen: The abdomen is soft and nontender. No masses are palpated. No CVA tenderness. No hernias are palpable. No hepatosplenomegaly noted.  Skin: Normal skin turgor, no visible rash and no visible skin lesions.  Neuro/Psych:. Mood and affect are appropriate.    Results/Data Urine [Data Includes: Last 1 Day]   [id number]  COLOR YELLOW   APPEARANCE CLEAR   SPECIFIC GRAVITY 1.010   pH 5.5   GLUCOSE NEG mg/dL  BILIRUBIN NEG   KETONE NEG mg/dL  BLOOD MOD   PROTEIN NEG mg/dL  UROBILINOGEN 0.2 mg/dL  NITRITE NEG   LEUKOCYTE ESTERASE NEG   SQUAMOUS EPITHELIAL/HPF RARE   WBC 0-2 WBC/hpf  RBC 3-6 RBC/hpf  BACTERIA RARE   CRYSTALS NONE SEEN   CASTS NONE SEEN   Other  MUCUS NOTED    Assessment Assessed  1. Nephrolithiasis (592.0) 2. Ureteral calculus (592.1)  Plan Ureteral calculus  1. Start: Ondansetron 4 MG Oral Tablet Dispersible; TAKE 1 TABLET Every 6 hours PRN 2. Follow-up Schedule Surgery Office  Follow-up  Status: Hold For -  Appointment   Requested for: 10Sep2015 3. KUB; Status:Resulted - Requires Verification;   Done: [id number] 12:00AM  Discussion/Summary   Barbara Thomas has a 7 mm x 12 mm stone in her left mid ureter. On KUB imaging today the stone is visible, but it is right over some of the hip bones. Lithotripsy is potentially an option. She would need to discontinue aspirin and visualization and positioning may be difficult. This is also a considerable stone burden to try to pass and, therefore, I would feel comfortable only giving her about a 70% chance of success with ESWL. Ureteroscopy would have a decidedly higher percentage of success. We did talk about both procedures. I would anticipate need for double-J stent placement status post ureteroscopy with Holmium laser lithotripsy. I do think that the chance of passing the stone spontaneously is quite low and have encouraged them to consider definitive intervention sooner rather than later. Barbara Thomas would like to go ahead as soon as possible with definitive management. We will attempt to get her on the schedule for first thing next week if at all possible. In the interim she will continue with pain medicine as well as ongoing treatment for nausea. She has no evidence of obvious urinary tract infection concurrently.   cc:  Dr Etter Sjogren   Signatures Electronically signed by : Rana Snare, M.D.; Nov 21 2013 11:40AM EST

## 2013-11-25 ENCOUNTER — Encounter (HOSPITAL_BASED_OUTPATIENT_CLINIC_OR_DEPARTMENT_OTHER)
Admission: RE | Disposition: A | Discharge status: Home / Self Care | Payer: Self-pay | Source: Ambulatory Visit | Attending: Urology

## 2013-11-25 ENCOUNTER — Ambulatory Visit (HOSPITAL_BASED_OUTPATIENT_CLINIC_OR_DEPARTMENT_OTHER): Payer: Managed Care, Other (non HMO) | Admitting: Anesthesiology

## 2013-11-25 ENCOUNTER — Encounter (HOSPITAL_BASED_OUTPATIENT_CLINIC_OR_DEPARTMENT_OTHER): Payer: Managed Care, Other (non HMO) | Admitting: Anesthesiology

## 2013-11-25 ENCOUNTER — Encounter (HOSPITAL_BASED_OUTPATIENT_CLINIC_OR_DEPARTMENT_OTHER): Payer: Self-pay | Admitting: *Deleted

## 2013-11-25 ENCOUNTER — Ambulatory Visit (HOSPITAL_BASED_OUTPATIENT_CLINIC_OR_DEPARTMENT_OTHER)
Admission: RE | Admit: 2013-11-25 | Discharge: 2013-11-25 | Disposition: A | Discharge status: Home / Self Care | Payer: Managed Care, Other (non HMO) | Source: Ambulatory Visit | Attending: Urology | Admitting: Urology

## 2013-11-25 DIAGNOSIS — Z86718 Personal history of other venous thrombosis and embolism: Secondary | ICD-10-CM | POA: Insufficient documentation

## 2013-11-25 DIAGNOSIS — G4730 Sleep apnea, unspecified: Secondary | ICD-10-CM | POA: Diagnosis not present

## 2013-11-25 DIAGNOSIS — N201 Calculus of ureter: Secondary | ICD-10-CM | POA: Diagnosis not present

## 2013-11-25 DIAGNOSIS — G43909 Migraine, unspecified, not intractable, without status migrainosus: Secondary | ICD-10-CM | POA: Diagnosis not present

## 2013-11-25 DIAGNOSIS — D414 Neoplasm of uncertain behavior of bladder: Secondary | ICD-10-CM | POA: Diagnosis not present

## 2013-11-25 HISTORY — DX: Other specified postprocedural states: R11.2

## 2013-11-25 HISTORY — DX: Other specified postprocedural states: Z98.890

## 2013-11-25 HISTORY — DX: Other specified postprocedural states: Z85.828

## 2013-11-25 HISTORY — DX: Unspecified osteoarthritis, unspecified site: M19.90

## 2013-11-25 HISTORY — DX: Calculus of ureter: N20.1

## 2013-11-25 HISTORY — DX: Personal history of other diseases of the digestive system: Z87.19

## 2013-11-25 HISTORY — DX: Presence of spectacles and contact lenses: Z97.3

## 2013-11-25 HISTORY — PX: HOLMIUM LASER APPLICATION: SHX5852

## 2013-11-25 HISTORY — PX: CYSTOSCOPY WITH RETROGRADE PYELOGRAM, URETEROSCOPY AND STENT PLACEMENT: SHX5789

## 2013-11-25 HISTORY — DX: Diverticulosis of large intestine without perforation or abscess without bleeding: K57.30

## 2013-11-25 HISTORY — DX: Personal history of other venous thrombosis and embolism: Z86.718

## 2013-11-25 HISTORY — DX: Personal history of pulmonary embolism: Z86.711

## 2013-11-25 SURGERY — CYSTOURETEROSCOPY, WITH RETROGRADE PYELOGRAM AND STENT INSERTION
Anesthesia: General | Site: Ureter | Laterality: Left

## 2013-11-25 MED ORDER — PROMETHAZINE HCL 25 MG/ML IJ SOLN
INTRAMUSCULAR | Status: AC
  Filled 2013-11-25: qty 1

## 2013-11-25 MED ORDER — SODIUM CHLORIDE 0.9 % IR SOLN
Status: DC | PRN
  Administered 2013-11-25: 4000 mL

## 2013-11-25 MED ORDER — METOCLOPRAMIDE HCL 5 MG/ML IJ SOLN
INTRAMUSCULAR | Status: DC | PRN
  Administered 2013-11-25: 10 mg via INTRAVENOUS

## 2013-11-25 MED ORDER — ONDANSETRON HCL 4 MG/2ML IJ SOLN
INTRAMUSCULAR | Status: DC | PRN
  Administered 2013-11-25: 4 mg via INTRAVENOUS

## 2013-11-25 MED ORDER — ACETAMINOPHEN 10 MG/ML IV SOLN
INTRAVENOUS | Status: DC | PRN
  Administered 2013-11-25: 1000 mg via INTRAVENOUS

## 2013-11-25 MED ORDER — FENTANYL CITRATE 0.05 MG/ML IJ SOLN
25.0000 ug | INTRAMUSCULAR | Status: DC | PRN
Start: 2013-11-25 — End: 2013-11-25
  Filled 2013-11-25: qty 1

## 2013-11-25 MED ORDER — MIDAZOLAM HCL 5 MG/5ML IJ SOLN
INTRAMUSCULAR | Status: DC | PRN
  Administered 2013-11-25: 2 mg via INTRAVENOUS

## 2013-11-25 MED ORDER — LACTATED RINGERS IV SOLN
INTRAVENOUS | Status: DC
  Administered 2013-11-25 (×2): via INTRAVENOUS
  Filled 2013-11-25: qty 1000

## 2013-11-25 MED ORDER — CIPROFLOXACIN IN D5W 400 MG/200ML IV SOLN
INTRAVENOUS | Status: AC
  Filled 2013-11-25: qty 200

## 2013-11-25 MED ORDER — URELLE 81 MG PO TABS
ORAL_TABLET | ORAL | Status: AC
  Filled 2013-11-25: qty 1

## 2013-11-25 MED ORDER — PROMETHAZINE HCL 25 MG/ML IJ SOLN
6.2500 mg | INTRAMUSCULAR | Status: DC | PRN
  Administered 2013-11-25: 6.25 mg via INTRAVENOUS
  Filled 2013-11-25: qty 1

## 2013-11-25 MED ORDER — BELLADONNA ALKALOIDS-OPIUM 16.2-60 MG RE SUPP
RECTAL | Status: AC
  Filled 2013-11-25: qty 1

## 2013-11-25 MED ORDER — PROMETHAZINE HCL 25 MG/ML IJ SOLN
12.5000 mg | INTRAMUSCULAR | Status: DC | PRN
  Filled 2013-11-25: qty 1

## 2013-11-25 MED ORDER — FENTANYL CITRATE 0.05 MG/ML IJ SOLN
INTRAMUSCULAR | Status: DC | PRN
  Administered 2013-11-25: 25 ug via INTRAVENOUS
  Administered 2013-11-25: 50 ug via INTRAVENOUS
  Administered 2013-11-25: 25 ug via INTRAVENOUS
  Administered 2013-11-25: 100 ug via INTRAVENOUS

## 2013-11-25 MED ORDER — MIDAZOLAM HCL 2 MG/2ML IJ SOLN
INTRAMUSCULAR | Status: AC
  Filled 2013-11-25: qty 2

## 2013-11-25 MED ORDER — URELLE 81 MG PO TABS
1.0000 | ORAL_TABLET | Freq: Four times a day (QID) | ORAL | Status: DC
  Administered 2013-11-25: 81 mg via ORAL
  Filled 2013-11-25: qty 1

## 2013-11-25 MED ORDER — CIPROFLOXACIN IN D5W 400 MG/200ML IV SOLN
400.0000 mg | INTRAVENOUS | Status: AC
  Administered 2013-11-25: 400 mg via INTRAVENOUS
  Filled 2013-11-25: qty 200

## 2013-11-25 MED ORDER — LACTATED RINGERS IV SOLN
INTRAVENOUS | Status: DC
  Filled 2013-11-25: qty 1000

## 2013-11-25 MED ORDER — SCOPOLAMINE 1 MG/3DAYS TD PT72
MEDICATED_PATCH | TRANSDERMAL | Status: AC
  Filled 2013-11-25: qty 1

## 2013-11-25 MED ORDER — URIBEL 118 MG PO CAPS: 1.0000 | ORAL_CAPSULE | Freq: Three times a day (TID) | ORAL | Status: DC | PRN

## 2013-11-25 MED ORDER — DEXAMETHASONE SODIUM PHOSPHATE 4 MG/ML IJ SOLN
INTRAMUSCULAR | Status: DC | PRN
Start: 2013-11-25 — End: 2013-11-25
  Administered 2013-11-25: 10 mg via INTRAVENOUS

## 2013-11-25 MED ORDER — PROPOFOL 10 MG/ML IV BOLUS
INTRAVENOUS | Status: DC | PRN
  Administered 2013-11-25: 200 mg via INTRAVENOUS
  Administered 2013-11-25: 50 mg via INTRAVENOUS

## 2013-11-25 MED ORDER — SCOPOLAMINE 1 MG/3DAYS TD PT72
1.0000 | MEDICATED_PATCH | TRANSDERMAL | Status: DC
  Administered 2013-11-25: 1.5 mg via TRANSDERMAL
  Filled 2013-11-25: qty 1

## 2013-11-25 MED ORDER — FENTANYL CITRATE 0.05 MG/ML IJ SOLN
INTRAMUSCULAR | Status: AC
  Filled 2013-11-25: qty 4

## 2013-11-25 MED ORDER — BELLADONNA ALKALOIDS-OPIUM 16.2-60 MG RE SUPP
RECTAL | Status: DC | PRN
  Administered 2013-11-25: 1 via RECTAL

## 2013-11-25 MED ORDER — LIDOCAINE HCL (CARDIAC) 20 MG/ML IV SOLN
INTRAVENOUS | Status: DC | PRN
  Administered 2013-11-25: 60 mg via INTRAVENOUS

## 2013-11-25 MED ORDER — SCOPOLAMINE 1 MG/3DAYS TD PT72: 1.0000 | MEDICATED_PATCH | TRANSDERMAL | Status: DC

## 2013-11-25 MED ORDER — IOHEXOL 350 MG/ML SOLN
INTRAVENOUS | Status: DC | PRN
Start: 2013-11-25 — End: 2013-11-25
  Administered 2013-11-25: 10 mL

## 2013-11-25 SURGICAL SUPPLY — 37 items
ADAPTER CATH URET PLST 4-6FR (CATHETERS) IMPLANT
BAG DRAIN URO-CYSTO SKYTR STRL (DRAIN) ×3 IMPLANT
BASKET ZERO TIP NITINOL 2.4FR (BASKET) ×3 IMPLANT
CANISTER SUCT LVC 12 LTR MEDI- (MISCELLANEOUS) ×3 IMPLANT
CATH INTERMIT  6FR 70CM (CATHETERS) ×3 IMPLANT
CATH URET 5FR 28IN CONE TIP (BALLOONS)
CATH URET 5FR 28IN OPEN ENDED (CATHETERS) IMPLANT
CATH URET 5FR 70CM CONE TIP (BALLOONS) IMPLANT
CLOTH BEACON ORANGE TIMEOUT ST (SAFETY) ×3 IMPLANT
DRAPE CAMERA CLOSED 9X96 (DRAPES) ×3 IMPLANT
DRSG TEGADERM 2-3/8X2-3/4 SM (GAUZE/BANDAGES/DRESSINGS) ×3 IMPLANT
FIBER LASER FLEXIVA 1000 (UROLOGICAL SUPPLIES) IMPLANT
FIBER LASER FLEXIVA 200 (UROLOGICAL SUPPLIES) IMPLANT
FIBER LASER FLEXIVA 365 (UROLOGICAL SUPPLIES) ×3 IMPLANT
FIBER LASER FLEXIVA 550 (UROLOGICAL SUPPLIES) IMPLANT
GLOVE BIO SURGEON STRL SZ 6 (GLOVE) ×3 IMPLANT
GLOVE BIO SURGEON STRL SZ7.5 (GLOVE) ×3 IMPLANT
GLOVE INDICATOR 6.5 STRL GRN (GLOVE) ×6 IMPLANT
GOWN STRL REIN XL XLG (GOWN DISPOSABLE) IMPLANT
GOWN STRL REUS W/ TWL LRG LVL3 (GOWN DISPOSABLE) ×1 IMPLANT
GOWN STRL REUS W/TWL LRG LVL3 (GOWN DISPOSABLE) ×2
GOWN STRL REUS W/TWL XL LVL3 (GOWN DISPOSABLE) ×3 IMPLANT
GUIDEWIRE 0.038 PTFE COATED (WIRE) IMPLANT
GUIDEWIRE ANG ZIPWIRE 038X150 (WIRE) IMPLANT
GUIDEWIRE STR DUAL SENSOR (WIRE) ×3 IMPLANT
IV NS 1000ML (IV SOLUTION) ×2
IV NS 1000ML BAXH (IV SOLUTION) ×1 IMPLANT
IV NS IRRIG 3000ML ARTHROMATIC (IV SOLUTION) ×3 IMPLANT
KIT BALLIN UROMAX 15FX10 (LABEL) IMPLANT
KIT BALLN UROMAX 15FX4 (MISCELLANEOUS) IMPLANT
KIT BALLN UROMAX 26 75X4 (MISCELLANEOUS)
NS IRRIG 500ML POUR BTL (IV SOLUTION) IMPLANT
PACK CYSTOSCOPY (CUSTOM PROCEDURE TRAY) ×3 IMPLANT
SET HIGH PRES BAL DIL (LABEL)
SHEATH ACCESS URETERAL 38CM (SHEATH) ×3 IMPLANT
SHEATH ACCESS URETERAL 54CM (SHEATH) IMPLANT
STENT URET 6FRX24 CONTOUR (STENTS) ×3 IMPLANT

## 2013-11-25 NOTE — Anesthesia Preprocedure Evaluation (Addendum)
Anesthesia Evaluation  Patient identified by MRN, date of birth, ID band Patient awake    Reviewed: Allergy & Precautions, H&P , NPO status , Patient's Chart, lab work & pertinent test results  History of Anesthesia Complications (+) PONV  Airway Mallampati: III TM Distance: >3 FB Neck ROM: full    Dental no notable dental hx. (+) Teeth Intact, Dental Advisory Given   Pulmonary sleep apnea and Continuous Positive Airway Pressure Ventilation , PE breath sounds clear to auscultation  Pulmonary exam normal       Cardiovascular Exercise Tolerance: Good + Peripheral Vascular Disease negative cardio ROS  Rhythm:regular Rate:Normal     Neuro/Psych  Headaches, negative neurological ROS  negative psych ROS   GI/Hepatic negative GI ROS, Neg liver ROS, hiatal hernia, GERD-  Medicated and Controlled,  Endo/Other  negative endocrine ROS  Renal/GU negative Renal ROS  negative genitourinary   Musculoskeletal  (+) Arthritis -, Osteoarthritis,    Abdominal   Peds  Hematology negative hematology ROS (+)   Anesthesia Other Findings   Reproductive/Obstetrics negative OB ROS                         Anesthesia Physical Anesthesia Plan  ASA: III  Anesthesia Plan: General   Post-op Pain Management:    Induction: Intravenous  Airway Management Planned: LMA  Additional Equipment:   Intra-op Plan:   Post-operative Plan:   Informed Consent: I have reviewed the patients History and Physical, chart, labs and discussed the procedure including the risks, benefits and alternatives for the proposed anesthesia with the patient or authorized representative who has indicated his/her understanding and acceptance.   Dental Advisory Given  Plan Discussed with: CRNA and Surgeon  Anesthesia Plan Comments:         Anesthesia Quick Evaluation

## 2013-11-25 NOTE — Op Note (Signed)
Preoperative diagnosis: left ureteral calculus 7x59mm  Postoperative diagnosis: left ureteral calculus, incidentally noted bladder tumor right hemitrigone  Procedure:  1. Cystoscopy 2. left ureteroscopy and stone removal 3. Ureteroscopic laser lithotripsy 4. left ureteral stent placement (83f) 22cm 5. left retrograde pyelography with interpretation  6. Cold cup resection of bladder tumor  Surgeon: Bernestine Amass, MD  Anesthesia: General  Complications: None  Intraoperative findings: left retrograde pyelography demonstrated a filling defect within the left ureter consistent with the patient's known calculus without other abnormalities.  EBL: Minimal  Specimens: 1. left ureteral calculus  Disposition of specimens: [facility] for stone analysis  Indication: Barbara Thomas is a 63 y.o.   patient with urolithiasis. After reviewing the management options for treatment, the patient elected to proceed with the above surgical procedure(s). We have discussed the potential benefits and risks of the procedure, side effects of the proposed treatment, the likelihood of the patient achieving the goals of the procedure, and any potential problems that might occur during the procedure or recuperation. Informed consent has been obtained.  Description of procedure:  The patient was taken to the operating room and general anesthesia was induced.  The patient was placed in the dorsal lithotomy position, prepped and draped in the usual sterile fashion, and preoperative antibiotics were administered. A preoperative time-out was performed.   Cystourethroscopy was performed.  The patient's urethra was examined and was normal. The bladder was then systematically examined in its entirety. An incidental 1 cm papillary-appearing tumor was noted in the right hemitrigone. Visual appearance was consistent with a low-grade noninvasive TCC.   Attention then turned to the left ureteral orifice and a  ureteral catheter was used to intubate the ureteral orifice.  Omnipaque contrast was injected through the ureteral catheter and a retrograde pyelogram was performed with findings as dictated above.  A 0.38 sensor guidewire was then advanced up the left ureter into the renal pelvis under fluoroscopic guidance. The 6 Fr semirigid ureteroscope was then advanced into the ureter next to the guidewire and the calculus was identified.   The stone was then fragmented with the 365 micron holmium laser fiber on a setting of 0.8J and frequency of 8 Hz.   All stones were then removed from the ureter with a zero tip nitinol basket.  Reinspection of the ureter revealed no remaining visible stones or fragments.   The wire was then backloaded through the cystoscope and a ureteral stent was advance over the wire using Seldinger technique.  The stent was positioned appropriately under fluoroscopic and cystoscopic guidance.  The wire was then removed with an adequate stent curl noted in the renal pelvis as well as in the bladder.  Attention was then turned towards the incidentally noted bladder tumor. Cold cup resection was used to remove the tumor in the right hemitrigone. Bugbee electrode was then utilized to fulgurate the base.  The bladder was then emptied and the procedure ended.  The patient appeared to tolerate the procedure well and without complications.  The patient was able to be awakened and transferred to the recovery unit in satisfactory condition.

## 2013-11-25 NOTE — Transfer of Care (Signed)
Immediate Anesthesia Transfer of Care Note  Patient: Barbara Thomas  Procedure(s) Performed: Procedure(s): CYSTOSCOPY WITH RETROGRADE PYELOGRAM, URETEROSCOPY AND STENT PLACEMENT WITH COLD CUP RESECTION OF BLADDER TUMOR  (Left) HOLMIUM LASER APPLICATION (Left)  Patient Location: PACU  Anesthesia Type:General  Level of Consciousness: awake, alert , oriented and patient cooperative  Airway & Oxygen Therapy: Patient Spontanous Breathing and Patient connected to nasal cannula oxygen  Post-op Assessment: Report given to PACU RN and Post -op Vital signs reviewed and stable  Post vital signs: Reviewed and stable  Complications: No apparent anesthesia complications

## 2013-11-25 NOTE — Anesthesia Procedure Notes (Signed)
Procedure Name: LMA Insertion Date/Time: 11/25/2013 9:45 AM Performed by: Mechele Claude Pre-anesthesia Checklist: Patient identified, Emergency Drugs available, Suction available and Patient being monitored Patient Re-evaluated:Patient Re-evaluated prior to inductionOxygen Delivery Method: Circle System Utilized Preoxygenation: Pre-oxygenation with 100% oxygen Intubation Type: IV induction Ventilation: Mask ventilation without difficulty LMA: LMA inserted LMA Size: 4.0 Number of attempts: 1 Airway Equipment and Method: bite block Placement Confirmation: positive ETCO2 Tube secured with: Tape Dental Injury: Teeth and Oropharynx as per pre-operative assessment

## 2013-11-25 NOTE — Interval H&P Note (Signed)
History and Physical Interval Note:  11/25/2013 9:36 AM  Barbara Thomas  has presented today for surgery, with the diagnosis of LEFT URETERAL CALCULI  The various methods of treatment have been discussed with the patient and family. After consideration of risks, benefits and other options for treatment, the patient has consented to  Procedure(s): CYSTOSCOPY WITH RETROGRADE PYELOGRAM, URETEROSCOPY AND STENT PLACEMENT (Left) HOLMIUM LASER APPLICATION (Left) as a surgical intervention .  The patient's history has been reviewed, patient examined, no change in status, stable for surgery.  I have reviewed the patient's chart and labs.  Questions were answered to the patient's satisfaction.     Saraiyah Hemminger S

## 2013-11-25 NOTE — Addendum Note (Signed)
Addendum created 11/25/13 1108 by Wanita Chamberlain, CRNA   Modules edited: Charges VN

## 2013-11-25 NOTE — Discharge Instructions (Addendum)
[facility] [phone] Post Ureteroscopy With or Without Stent Instructions  Definitions:  Ureter: The duct that transports urine from the kidney to the bladder. Stent:   A plastic hollow tube that is placed into the ureter, from the kidney to the                 bladder to prevent the ureter from swelling shut.  GENERAL INSTRUCTIONS:  Despite the fact that no skin incisions were used, the area around the ureter and bladder is raw and irritated. The stent is a foreign body which will further irritate the bladder wall. This irritation is manifested by increased frequency of urination, both day and night, and by an increase in the urge to urinate. In some, the urge to urinate is present almost always. Sometimes the urge is strong enough that you may not be able to stop yourself from urinating. The only real cure is to remove the stent and then give time for the bladder wall to heal which can't be done until the danger of the ureter swelling shut has passed, which varies.  You may see some blood in your urine while the stent is in place and a few days afterwards. Do not be alarmed, even if the urine was clear for a while. Get off your feet and drink lots of fluids until clearing occurs. If you start to pass clots or don't improve, call us.  DIET: You may return to your normal diet immediately. Because of the raw surface of your bladder, alcohol, spicy foods, acid type foods and drinks with caffeine may cause irritation or frequency and should be used in moderation. To keep your urine flowing freely and to avoid constipation, drink plenty of fluids during the day ( 8-10 glasses ). Tip: Avoid cranberry juice because it is very acidic.  ACTIVITY: Your physical activity doesn't need to be restricted. However, if you are very active, you may see some blood in your urine. We suggest that you reduce your activity under these circumstances until the bleeding has stopped.  BOWELS: It is  important to keep your bowels regular during the postoperative period. Straining with bowel movements can cause bleeding. A bowel movement every other day is reasonable. Use a mild laxative if needed, such as Milk of Magnesia 2-3 tablespoons, or 2 Dulcolax tablets. Call if you continue to have problems. If you have been taking narcotics for pain, before, during or after your surgery, you may be constipated. Take a laxative if necessary.   MEDICATION: You should resume your pre-surgery medications unless told not to. In addition you will often be given an antibiotic to prevent infection. These should be taken as prescribed until the bottles are finished unless you are having an unusual reaction to one of the drugs.  PROBLEMS YOU SHOULD REPORT TO [place]:  Fevers over 100.5 Fahrenheit.  Heavy bleeding, or clots ( See above notes about blood in urine ).  Inability to urinate.  Drug reactions ( hives, rash, nausea, vomiting, diarrhea ).  Severe burning or pain with urination that is not improving.  FOLLOW-UP: You will need a follow-up appointment to monitor your progress. Call for this appointment at the number listed above. Usually the first appointment will be about three to fourteen days after your surgery. Appointment is 12-02-13   Post Anesthesia Home Care Instructions  Activity: Get plenty of rest for the remainder of the day. A responsible adult should stay with you for 24 hours following the procedure.  For the next 24 hours, DO NOT: -Drive a car -Paediatric nurse -Drink alcoholic beverages -Take any medication unless instructed by your physician -Make any legal decisions or sign important papers.  Meals: Start with liquid foods such as gelatin or soup. Progress to regular foods as tolerated. Avoid greasy, spicy, heavy foods. If nausea and/or vomiting occur, drink only clear liquids until the nausea and/or vomiting subsides. Call your physician if vomiting continues.  Special  Instructions/Symptoms: Your throat may feel dry or sore from the anesthesia or the breathing tube placed in your throat during surgery. If this causes discomfort, gargle with warm salt water. The discomfort should disappear within 24 hours.

## 2013-11-25 NOTE — Anesthesia Postprocedure Evaluation (Signed)
  Anesthesia Post-op Note  Patient: Barbara Thomas  Procedure(s) Performed: Procedure(s) (LRB): CYSTOSCOPY WITH RETROGRADE PYELOGRAM, URETEROSCOPY AND STENT PLACEMENT WITH COLD CUP RESECTION OF BLADDER TUMOR  (Left) HOLMIUM LASER APPLICATION (Left)  Patient Location: PACU  Anesthesia Type: General  Level of Consciousness: awake and alert   Airway and Oxygen Therapy: Patient Spontanous Breathing  Post-op Pain: mild  Post-op Assessment: Post-op Vital signs reviewed, Patient's Cardiovascular Status Stable, Respiratory Function Stable, Patent Airway and No signs of Nausea or vomiting  Last Vitals:  Filed Vitals:   11/25/13 1045  BP: 121/74  Pulse: 85  Temp:   Resp: 15    Post-op Vital Signs: stable   Complications: No apparent anesthesia complications

## 2013-11-25 NOTE — Addendum Note (Signed)
Addendum created 11/25/13 1110 by Peyton Najjar, MD   Modules edited: Orders

## 2013-11-26 ENCOUNTER — Encounter (HOSPITAL_BASED_OUTPATIENT_CLINIC_OR_DEPARTMENT_OTHER): Payer: Self-pay | Admitting: Urology

## 2014-02-28 ENCOUNTER — Encounter: Payer: Self-pay | Admitting: Family Medicine

## 2014-03-27 ENCOUNTER — Encounter: Payer: Self-pay | Admitting: Family Medicine

## 2014-03-27 ENCOUNTER — Ambulatory Visit (INDEPENDENT_AMBULATORY_CARE_PROVIDER_SITE_OTHER): Payer: 59 | Admitting: Family Medicine

## 2014-03-27 VITALS — BP 146/86 | HR 94 | Temp 98.1°F | Ht 70.0 in | Wt 227.0 lb

## 2014-03-27 DIAGNOSIS — Z9989 Dependence on other enabling machines and devices: Secondary | ICD-10-CM

## 2014-03-27 DIAGNOSIS — K219 Gastro-esophageal reflux disease without esophagitis: Secondary | ICD-10-CM

## 2014-03-27 DIAGNOSIS — E569 Vitamin deficiency, unspecified: Secondary | ICD-10-CM

## 2014-03-27 DIAGNOSIS — Z0000 Encounter for general adult medical examination without abnormal findings: Secondary | ICD-10-CM

## 2014-03-27 DIAGNOSIS — G4733 Obstructive sleep apnea (adult) (pediatric): Secondary | ICD-10-CM

## 2014-03-27 MED ORDER — VITAMIN D (ERGOCALCIFEROL) 1.25 MG (50000 UNIT) PO CAPS: 50000.0000 [IU] | ORAL_CAPSULE | ORAL | Status: DC

## 2014-03-27 MED ORDER — PANTOPRAZOLE SODIUM 40 MG PO TBEC: 40.0000 mg | DELAYED_RELEASE_TABLET | Freq: Every day | ORAL | Status: DC

## 2014-03-27 NOTE — Patient Instructions (Signed)
Preventive Care for Adults A healthy lifestyle and preventive care can promote health and wellness. Preventive health guidelines for women include the following key practices.  A routine yearly physical is a good way to check with your health care provider about your health and preventive screening. It is a chance to share any concerns and updates on your health and to receive a thorough exam.  Visit your dentist for a routine exam and preventive care every 6 months. Brush your teeth twice a day and floss once a day. Good oral hygiene prevents tooth decay and gum disease.  The frequency of eye exams is based on your age, health, family medical history, use of contact lenses, and other factors. Follow your health care provider's recommendations for frequency of eye exams.  Eat a healthy diet. Foods like vegetables, fruits, whole grains, low-fat dairy products, and lean protein foods contain the nutrients you need without too many calories. Decrease your intake of foods high in solid fats, added sugars, and salt. Eat the right amount of calories for you.Get information about a proper diet from your health care provider, if necessary.  Regular physical exercise is one of the most important things you can do for your health. Most adults should get at least 150 minutes of moderate-intensity exercise (any activity that increases your heart rate and causes you to sweat) each week. In addition, most adults need muscle-strengthening exercises on 2 or more days a week.  Maintain a healthy weight. The body mass index (BMI) is a screening tool to identify possible weight problems. It provides an estimate of body fat based on height and weight. Your health care provider can find your BMI and can help you achieve or maintain a healthy weight.For adults 20 years and older:  A BMI below 18.5 is considered underweight.  A BMI of 18.5 to 24.9 is normal.  A BMI of 25 to 29.9 is considered overweight.  A BMI of  30 and above is considered obese.  Maintain normal blood lipids and cholesterol levels by exercising and minimizing your intake of saturated fat. Eat a balanced diet with plenty of fruit and vegetables. Blood tests for lipids and cholesterol should begin at age 76 and be repeated every 5 years. If your lipid or cholesterol levels are high, you are over 50, or you are at high risk for heart disease, you may need your cholesterol levels checked more frequently.Ongoing high lipid and cholesterol levels should be treated with medicines if diet and exercise are not working.  If you smoke, find out from your health care provider how to quit. If you do not use tobacco, do not start.  Lung cancer screening is recommended for adults aged 22-80 years who are at high risk for developing lung cancer because of a history of smoking. A yearly low-dose CT scan of the lungs is recommended for people who have at least a 30-pack-year history of smoking and are a current smoker or have quit within the past 15 years. A pack year of smoking is smoking an average of 1 pack of cigarettes a day for 1 year (for example: 1 pack a day for 30 years or 2 packs a day for 15 years). Yearly screening should continue until the smoker has stopped smoking for at least 15 years. Yearly screening should be stopped for people who develop a health problem that would prevent them from having lung cancer treatment.  If you are pregnant, do not drink alcohol. If you are breastfeeding,  be very cautious about drinking alcohol. If you are not pregnant and choose to drink alcohol, do not have more than 1 drink per day. One drink is considered to be 12 ounces (355 mL) of beer, 5 ounces (148 mL) of wine, or 1.5 ounces (44 mL) of liquor.  Avoid use of street drugs. Do not share needles with anyone. Ask for help if you need support or instructions about stopping the use of drugs.  High blood pressure causes heart disease and increases the risk of  stroke. Your blood pressure should be checked at least every 1 to 2 years. Ongoing high blood pressure should be treated with medicines if weight loss and exercise do not work.  If you are 75-52 years old, ask your health care provider if you should take aspirin to prevent strokes.  Diabetes screening involves taking a blood sample to check your fasting blood sugar level. This should be done once every 3 years, after age 15, if you are within normal weight and without risk factors for diabetes. Testing should be considered at a younger age or be carried out more frequently if you are overweight and have at least 1 risk factor for diabetes.  Breast cancer screening is essential preventive care for women. You should practice "breast self-awareness." This means understanding the normal appearance and feel of your breasts and may include breast self-examination. Any changes detected, no matter how small, should be reported to a health care provider. Women in their 58s and 30s should have a clinical breast exam (CBE) by a health care provider as part of a regular health exam every 1 to 3 years. After age 16, women should have a CBE every year. Starting at age 53, women should consider having a mammogram (breast X-ray test) every year. Women who have a family history of breast cancer should talk to their health care provider about genetic screening. Women at a high risk of breast cancer should talk to their health care providers about having an MRI and a mammogram every year.  Breast cancer gene (BRCA)-related cancer risk assessment is recommended for women who have family members with BRCA-related cancers. BRCA-related cancers include breast, ovarian, tubal, and peritoneal cancers. Having family members with these cancers may be associated with an increased risk for harmful changes (mutations) in the breast cancer genes BRCA1 and BRCA2. Results of the assessment will determine the need for genetic counseling and  BRCA1 and BRCA2 testing.  Routine pelvic exams to screen for cancer are no longer recommended for nonpregnant women who are considered low risk for cancer of the pelvic organs (ovaries, uterus, and vagina) and who do not have symptoms. Ask your health care provider if a screening pelvic exam is right for you.  If you have had past treatment for cervical cancer or a condition that could lead to cancer, you need Pap tests and screening for cancer for at least 20 years after your treatment. If Pap tests have been discontinued, your risk factors (such as having a new sexual partner) need to be reassessed to determine if screening should be resumed. Some women have medical problems that increase the chance of getting cervical cancer. In these cases, your health care provider may recommend more frequent screening and Pap tests.  The HPV test is an additional test that may be used for cervical cancer screening. The HPV test looks for the virus that can cause the cell changes on the cervix. The cells collected during the Pap test can be  tested for HPV. The HPV test could be used to screen women aged 30 years and older, and should be used in women of any age who have unclear Pap test results. After the age of 30, women should have HPV testing at the same frequency as a Pap test.  Colorectal cancer can be detected and often prevented. Most routine colorectal cancer screening begins at the age of 50 years and continues through age 75 years. However, your health care provider may recommend screening at an earlier age if you have risk factors for colon cancer. On a yearly basis, your health care provider may provide home test kits to check for hidden blood in the stool. Use of a small camera at the end of a tube, to directly examine the colon (sigmoidoscopy or colonoscopy), can detect the earliest forms of colorectal cancer. Talk to your health care provider about this at age 50, when routine screening begins. Direct  exam of the colon should be repeated every 5-10 years through age 75 years, unless early forms of pre-cancerous polyps or small growths are found.  People who are at an increased risk for hepatitis B should be screened for this virus. You are considered at high risk for hepatitis B if:  You were born in a country where hepatitis B occurs often. Talk with your health care provider about which countries are considered high risk.  Your parents were born in a high-risk country and you have not received a shot to protect against hepatitis B (hepatitis B vaccine).  You have HIV or AIDS.  You use needles to inject street drugs.  You live with, or have sex with, someone who has hepatitis B.  You get hemodialysis treatment.  You take certain medicines for conditions like cancer, organ transplantation, and autoimmune conditions.  Hepatitis C blood testing is recommended for all people born from 1945 through 1965 and any individual with known risks for hepatitis C.  Practice safe sex. Use condoms and avoid high-risk sexual practices to reduce the spread of sexually transmitted infections (STIs). STIs include gonorrhea, chlamydia, syphilis, trichomonas, herpes, HPV, and human immunodeficiency virus (HIV). Herpes, HIV, and HPV are viral illnesses that have no cure. They can result in disability, cancer, and death.  You should be screened for sexually transmitted illnesses (STIs) including gonorrhea and chlamydia if:  You are sexually active and are younger than 24 years.  You are older than 24 years and your health care provider tells you that you are at risk for this type of infection.  Your sexual activity has changed since you were last screened and you are at an increased risk for chlamydia or gonorrhea. Ask your health care provider if you are at risk.  If you are at risk of being infected with HIV, it is recommended that you take a prescription medicine daily to prevent HIV infection. This is  called preexposure prophylaxis (PrEP). You are considered at risk if:  You are a heterosexual woman, are sexually active, and are at increased risk for HIV infection.  You take drugs by injection.  You are sexually active with a partner who has HIV.  Talk with your health care provider about whether you are at high risk of being infected with HIV. If you choose to begin PrEP, you should first be tested for HIV. You should then be tested every 3 months for as long as you are taking PrEP.  Osteoporosis is a disease in which the bones lose minerals and strength   with aging. This can result in serious bone fractures or breaks. The risk of osteoporosis can be identified using a bone density scan. Women ages 65 years and over and women at risk for fractures or osteoporosis should discuss screening with their health care providers. Ask your health care provider whether you should take a calcium supplement or vitamin D to reduce the rate of osteoporosis.  Menopause can be associated with physical symptoms and risks. Hormone replacement therapy is available to decrease symptoms and risks. You should talk to your health care provider about whether hormone replacement therapy is right for you.  Use sunscreen. Apply sunscreen liberally and repeatedly throughout the day. You should seek shade when your shadow is shorter than you. Protect yourself by wearing long sleeves, pants, a wide-brimmed hat, and sunglasses year round, whenever you are outdoors.  Once a month, do a whole body skin exam, using a mirror to look at the skin on your back. Tell your health care provider of new moles, moles that have irregular borders, moles that are larger than a pencil eraser, or moles that have changed in shape or color.  Stay current with required vaccines (immunizations).  Influenza vaccine. All adults should be immunized every year.  Tetanus, diphtheria, and acellular pertussis (Td, Tdap) vaccine. Pregnant women should  receive 1 dose of Tdap vaccine during each pregnancy. The dose should be obtained regardless of the length of time since the last dose. Immunization is preferred during the 27th-36th week of gestation. An adult who has not previously received Tdap or who does not know her vaccine status should receive 1 dose of Tdap. This initial dose should be followed by tetanus and diphtheria toxoids (Td) booster doses every 10 years. Adults with an unknown or incomplete history of completing a 3-dose immunization series with Td-containing vaccines should begin or complete a primary immunization series including a Tdap dose. Adults should receive a Td booster every 10 years.  Varicella vaccine. An adult without evidence of immunity to varicella should receive 2 doses or a second dose if she has previously received 1 dose. Pregnant females who do not have evidence of immunity should receive the first dose after pregnancy. This first dose should be obtained before leaving the health care facility. The second dose should be obtained 4-8 weeks after the first dose.  Human papillomavirus (HPV) vaccine. Females aged 13-26 years who have not received the vaccine previously should obtain the 3-dose series. The vaccine is not recommended for use in pregnant females. However, pregnancy testing is not needed before receiving a dose. If a female is found to be pregnant after receiving a dose, no treatment is needed. In that case, the remaining doses should be delayed until after the pregnancy. Immunization is recommended for any person with an immunocompromised condition through the age of 26 years if she did not get any or all doses earlier. During the 3-dose series, the second dose should be obtained 4-8 weeks after the first dose. The third dose should be obtained 24 weeks after the first dose and 16 weeks after the second dose.  Zoster vaccine. One dose is recommended for adults aged 60 years or older unless certain conditions are  present.  Measles, mumps, and rubella (MMR) vaccine. Adults born before 1957 generally are considered immune to measles and mumps. Adults born in 1957 or later should have 1 or more doses of MMR vaccine unless there is a contraindication to the vaccine or there is laboratory evidence of immunity to   each of the three diseases. A routine second dose of MMR vaccine should be obtained at least 28 days after the first dose for students attending postsecondary schools, health care workers, or international travelers. People who received inactivated measles vaccine or an unknown type of measles vaccine during 1963-1967 should receive 2 doses of MMR vaccine. People who received inactivated mumps vaccine or an unknown type of mumps vaccine before 1979 and are at high risk for mumps infection should consider immunization with 2 doses of MMR vaccine. For females of childbearing age, rubella immunity should be determined. If there is no evidence of immunity, females who are not pregnant should be vaccinated. If there is no evidence of immunity, females who are pregnant should delay immunization until after pregnancy. Unvaccinated health care workers born before 1957 who lack laboratory evidence of measles, mumps, or rubella immunity or laboratory confirmation of disease should consider measles and mumps immunization with 2 doses of MMR vaccine or rubella immunization with 1 dose of MMR vaccine.  Pneumococcal 13-valent conjugate (PCV13) vaccine. When indicated, a person who is uncertain of her immunization history and has no record of immunization should receive the PCV13 vaccine. An adult aged 19 years or older who has certain medical conditions and has not been previously immunized should receive 1 dose of PCV13 vaccine. This PCV13 should be followed with a dose of pneumococcal polysaccharide (PPSV23) vaccine. The PPSV23 vaccine dose should be obtained at least 8 weeks after the dose of PCV13 vaccine. An adult aged 19  years or older who has certain medical conditions and previously received 1 or more doses of PPSV23 vaccine should receive 1 dose of PCV13. The PCV13 vaccine dose should be obtained 1 or more years after the last PPSV23 vaccine dose.  Pneumococcal polysaccharide (PPSV23) vaccine. When PCV13 is also indicated, PCV13 should be obtained first. All adults aged 65 years and older should be immunized. An adult younger than age 65 years who has certain medical conditions should be immunized. Any person who resides in a nursing home or long-term care facility should be immunized. An adult smoker should be immunized. People with an immunocompromised condition and certain other conditions should receive both PCV13 and PPSV23 vaccines. People with human immunodeficiency virus (HIV) infection should be immunized as soon as possible after diagnosis. Immunization during chemotherapy or radiation therapy should be avoided. Routine use of PPSV23 vaccine is not recommended for American Indians, [place] Natives, or people younger than 65 years unless there are medical conditions that require PPSV23 vaccine. When indicated, people who have unknown immunization and have no record of immunization should receive PPSV23 vaccine. One-time revaccination 5 years after the first dose of PPSV23 is recommended for people aged 19-64 years who have chronic kidney failure, nephrotic syndrome, asplenia, or immunocompromised conditions. People who received 1-2 doses of PPSV23 before age 65 years should receive another dose of PPSV23 vaccine at age 65 years or later if at least 5 years have passed since the previous dose. Doses of PPSV23 are not needed for people immunized with PPSV23 at or after age 65 years.  Meningococcal vaccine. Adults with asplenia or persistent complement component deficiencies should receive 2 doses of quadrivalent meningococcal conjugate (MenACWY-D) vaccine. The doses should be obtained at least 2 months apart.  Microbiologists working with certain meningococcal bacteria, military recruits, people at risk during an outbreak, and people who travel to or live in countries with a high rate of meningitis should be immunized. A first-year college student up through age   21 years who is living in a residence hall should receive a dose if she did not receive a dose on or after her 16th birthday. Adults who have certain high-risk conditions should receive one or more doses of vaccine.  Hepatitis A vaccine. Adults who wish to be protected from this disease, have certain high-risk conditions, work with hepatitis A-infected animals, work in hepatitis A research labs, or travel to or work in countries with a high rate of hepatitis A should be immunized. Adults who were previously unvaccinated and who anticipate close contact with an international adoptee during the first 60 days after arrival in the [place] from a country with a high rate of hepatitis A should be immunized.  Hepatitis B vaccine. Adults who wish to be protected from this disease, have certain high-risk conditions, may be exposed to blood or other infectious body fluids, are household contacts or sex partners of hepatitis B positive people, are clients or workers in certain care facilities, or travel to or work in countries with a high rate of hepatitis B should be immunized.  Haemophilus influenzae type b (Hib) vaccine. A previously unvaccinated person with asplenia or sickle cell disease or having a scheduled splenectomy should receive 1 dose of Hib vaccine. Regardless of previous immunization, a recipient of a hematopoietic stem cell transplant should receive a 3-dose series 6-12 months after her successful transplant. Hib vaccine is not recommended for adults with HIV infection. Preventive Services / Frequency Ages 64 to 68 years  Blood pressure check.** / Every 1 to 2 years.  Lipid and cholesterol check.** / Every 5 years beginning at age  22.  Clinical breast exam.** / Every 3 years for women in their 88s and 53s.  BRCA-related cancer risk assessment.** / For women who have family members with a BRCA-related cancer (breast, ovarian, tubal, or peritoneal cancers).  Pap test.** / Every 2 years from ages 90 through 51. Every 3 years starting at age 21 through age 56 or 3 with a history of 3 consecutive normal Pap tests.  HPV screening.** / Every 3 years from ages 24 through ages 1 to 46 with a history of 3 consecutive normal Pap tests.  Hepatitis C blood test.** / For any individual with known risks for hepatitis C.  Skin self-exam. / Monthly.  Influenza vaccine. / Every year.  Tetanus, diphtheria, and acellular pertussis (Tdap, Td) vaccine.** / Consult your health care provider. Pregnant women should receive 1 dose of Tdap vaccine during each pregnancy. 1 dose of Td every 10 years.  Varicella vaccine.** / Consult your health care provider. Pregnant females who do not have evidence of immunity should receive the first dose after pregnancy.  HPV vaccine. / 3 doses over 6 months, if 72 and younger. The vaccine is not recommended for use in pregnant females. However, pregnancy testing is not needed before receiving a dose.  Measles, mumps, rubella (MMR) vaccine.** / You need at least 1 dose of MMR if you were born in 1957 or later. You may also need a 2nd dose. For females of childbearing age, rubella immunity should be determined. If there is no evidence of immunity, females who are not pregnant should be vaccinated. If there is no evidence of immunity, females who are pregnant should delay immunization until after pregnancy.  Pneumococcal 13-valent conjugate (PCV13) vaccine.** / Consult your health care provider.  Pneumococcal polysaccharide (PPSV23) vaccine.** / 1 to 2 doses if you smoke cigarettes or if you have certain conditions.  Meningococcal vaccine.** /  1 dose if you are age 19 to 21 years and a first-year college  student living in a residence hall, or have one of several medical conditions, you need to get vaccinated against meningococcal disease. You may also need additional booster doses.  Hepatitis A vaccine.** / Consult your health care provider.  Hepatitis B vaccine.** / Consult your health care provider.  Haemophilus influenzae type b (Hib) vaccine.** / Consult your health care provider. Ages 40 to 64 years  Blood pressure check.** / Every 1 to 2 years.  Lipid and cholesterol check.** / Every 5 years beginning at age 20 years.  Lung cancer screening. / Every year if you are aged 55-80 years and have a 30-pack-year history of smoking and currently smoke or have quit within the past 15 years. Yearly screening is stopped once you have quit smoking for at least 15 years or develop a health problem that would prevent you from having lung cancer treatment.  Clinical breast exam.** / Every year after age 40 years.  BRCA-related cancer risk assessment.** / For women who have family members with a BRCA-related cancer (breast, ovarian, tubal, or peritoneal cancers).  Mammogram.** / Every year beginning at age 40 years and continuing for as long as you are in good health. Consult with your health care provider.  Pap test.** / Every 3 years starting at age 30 years through age 65 or 70 years with a history of 3 consecutive normal Pap tests.  HPV screening.** / Every 3 years from ages 30 years through ages 65 to 70 years with a history of 3 consecutive normal Pap tests.  Fecal occult blood test (FOBT) of stool. / Every year beginning at age 50 years and continuing until age 75 years. You may not need to do this test if you get a colonoscopy every 10 years.  Flexible sigmoidoscopy or colonoscopy.** / Every 5 years for a flexible sigmoidoscopy or every 10 years for a colonoscopy beginning at age 50 years and continuing until age 75 years.  Hepatitis C blood test.** / For all people born from 1945 through  1965 and any individual with known risks for hepatitis C.  Skin self-exam. / Monthly.  Influenza vaccine. / Every year.  Tetanus, diphtheria, and acellular pertussis (Tdap/Td) vaccine.** / Consult your health care provider. Pregnant women should receive 1 dose of Tdap vaccine during each pregnancy. 1 dose of Td every 10 years.  Varicella vaccine.** / Consult your health care provider. Pregnant females who do not have evidence of immunity should receive the first dose after pregnancy.  Zoster vaccine.** / 1 dose for adults aged 60 years or older.  Measles, mumps, rubella (MMR) vaccine.** / You need at least 1 dose of MMR if you were born in 1957 or later. You may also need a 2nd dose. For females of childbearing age, rubella immunity should be determined. If there is no evidence of immunity, females who are not pregnant should be vaccinated. If there is no evidence of immunity, females who are pregnant should delay immunization until after pregnancy.  Pneumococcal 13-valent conjugate (PCV13) vaccine.** / Consult your health care provider.  Pneumococcal polysaccharide (PPSV23) vaccine.** / 1 to 2 doses if you smoke cigarettes or if you have certain conditions.  Meningococcal vaccine.** / Consult your health care provider.  Hepatitis A vaccine.** / Consult your health care provider.  Hepatitis B vaccine.** / Consult your health care provider.  Haemophilus influenzae type b (Hib) vaccine.** / Consult your health care provider. Ages 65   years and over  Blood pressure check.** / Every 1 to 2 years.  Lipid and cholesterol check.** / Every 5 years beginning at age 22 years.  Lung cancer screening. / Every year if you are aged 73-80 years and have a 30-pack-year history of smoking and currently smoke or have quit within the past 15 years. Yearly screening is stopped once you have quit smoking for at least 15 years or develop a health problem that would prevent you from having lung cancer  treatment.  Clinical breast exam.** / Every year after age 4 years.  BRCA-related cancer risk assessment.** / For women who have family members with a BRCA-related cancer (breast, ovarian, tubal, or peritoneal cancers).  Mammogram.** / Every year beginning at age 40 years and continuing for as long as you are in good health. Consult with your health care provider.  Pap test.** / Every 3 years starting at age 9 years through age 34 or 91 years with 3 consecutive normal Pap tests. Testing can be stopped between 65 and 70 years with 3 consecutive normal Pap tests and no abnormal Pap or HPV tests in the past 10 years.  HPV screening.** / Every 3 years from ages 57 years through ages 64 or 45 years with a history of 3 consecutive normal Pap tests. Testing can be stopped between 65 and 70 years with 3 consecutive normal Pap tests and no abnormal Pap or HPV tests in the past 10 years.  Fecal occult blood test (FOBT) of stool. / Every year beginning at age 15 years and continuing until age 17 years. You may not need to do this test if you get a colonoscopy every 10 years.  Flexible sigmoidoscopy or colonoscopy.** / Every 5 years for a flexible sigmoidoscopy or every 10 years for a colonoscopy beginning at age 86 years and continuing until age 71 years.  Hepatitis C blood test.** / For all people born from [birth year] through 1964 and any individual with known risks for hepatitis C.  Osteoporosis screening.** / A one-time screening for women ages 83 years and over and women at risk for fractures or osteoporosis.  Skin self-exam. / Monthly.  Influenza vaccine. / Every year.  Tetanus, diphtheria, and acellular pertussis (Tdap/Td) vaccine.** / 1 dose of Td every 10 years.  Varicella vaccine.** / Consult your health care provider.  Zoster vaccine.** / 1 dose for adults aged 61 years or older.  Pneumococcal 13-valent conjugate (PCV13) vaccine.** / Consult your health care provider.  Pneumococcal  polysaccharide (PPSV23) vaccine.** / 1 dose for all adults aged 28 years and older.  Meningococcal vaccine.** / Consult your health care provider.  Hepatitis A vaccine.** / Consult your health care provider.  Hepatitis B vaccine.** / Consult your health care provider.  Haemophilus influenzae type b (Hib) vaccine.** / Consult your health care provider. ** Family history and personal history of risk and conditions may change your health care provider's recommendations. Document Released: 04/26/2001 Document Revised: 07/15/2013 Document Reviewed: 07/26/2010 [facility] Patient Information 2015 [place], [place]. This information is not intended to replace advice given to you by your health care provider. Make sure you discuss any questions you have with your health care provider.

## 2014-03-27 NOTE — Progress Notes (Signed)
Pre visit review using our clinic review tool, if applicable. No additional management support is needed unless otherwise documented below in the visit note. 

## 2014-03-28 ENCOUNTER — Other Ambulatory Visit (INDEPENDENT_AMBULATORY_CARE_PROVIDER_SITE_OTHER): Payer: 59

## 2014-03-28 DIAGNOSIS — Z0000 Encounter for general adult medical examination without abnormal findings: Secondary | ICD-10-CM

## 2014-03-28 DIAGNOSIS — K219 Gastro-esophageal reflux disease without esophagitis: Secondary | ICD-10-CM

## 2014-03-28 DIAGNOSIS — E569 Vitamin deficiency, unspecified: Secondary | ICD-10-CM

## 2014-03-28 LAB — CBC WITH DIFFERENTIAL/PLATELET
BASOS ABS: 0 10*3/uL (ref 0.0–0.1)
BASOS PCT: 0.6 % (ref 0.0–3.0)
Eosinophils Absolute: 0.2 10*3/uL (ref 0.0–0.7)
Eosinophils Relative: 3.5 % (ref 0.0–5.0)
HEMATOCRIT: 41.6 % (ref 36.0–46.0)
Hemoglobin: 13.6 g/dL (ref 12.0–15.0)
Lymphocytes Relative: 21.3 % (ref 12.0–46.0)
Lymphs Abs: 1.3 10*3/uL (ref 0.7–4.0)
MCHC: 32.7 g/dL (ref 30.0–36.0)
MCV: 84.6 fl (ref 78.0–100.0)
Monocytes Absolute: 0.4 10*3/uL (ref 0.1–1.0)
Monocytes Relative: 7.3 % (ref 3.0–12.0)
Neutro Abs: 4.1 10*3/uL (ref 1.4–7.7)
Neutrophils Relative %: 67.3 % (ref 43.0–77.0)
Platelets: 272 10*3/uL (ref 150.0–400.0)
RBC: 4.92 Mil/uL (ref 3.87–5.11)
RDW: 13.9 % (ref 11.5–15.5)
WBC: 6 10*3/uL (ref 4.0–10.5)

## 2014-03-28 LAB — LIPID PANEL
CHOL/HDL RATIO: 5
Cholesterol: 233 mg/dL — ABNORMAL HIGH (ref 0–200)
HDL: 50.8 mg/dL (ref >39.00)
LDL CALC: 151 mg/dL — AB (ref 0–99)
NonHDL: 182.2
TRIGLYCERIDES: 157 mg/dL — AB (ref 0.0–149.0)
VLDL: 31.4 mg/dL (ref 0.0–40.0)

## 2014-03-28 LAB — POCT URINALYSIS DIPSTICK
Bilirubin, UA: NEGATIVE
Blood, UA: NEGATIVE
GLUCOSE UA: NEGATIVE
KETONES UA: NEGATIVE
LEUKOCYTES UA: NEGATIVE
Nitrite, UA: NEGATIVE
PH UA: 6
Protein, UA: NEGATIVE
SPEC GRAV UA: 1.025
UROBILINOGEN UA: NEGATIVE

## 2014-03-28 LAB — BASIC METABOLIC PANEL
BUN: 19 mg/dL (ref 6–23)
CO2: 26 meq/L (ref 19–32)
Calcium: 9.6 mg/dL (ref 8.4–10.5)
Chloride: 104 mEq/L (ref 96–112)
Creatinine, Ser: 0.94 mg/dL (ref 0.40–1.20)
GFR: 64.05 mL/min (ref >60.00)
Glucose, Bld: 116 mg/dL — ABNORMAL HIGH (ref 70–99)
Potassium: 3.9 mEq/L (ref 3.5–5.1)
SODIUM: 138 meq/L (ref 135–145)

## 2014-03-28 LAB — HEPATIC FUNCTION PANEL
ALK PHOS: 92 U/L (ref 39–117)
ALT: 27 U/L (ref 0–35)
AST: 20 U/L (ref 0–37)
Albumin: 4 g/dL (ref 3.5–5.2)
Bilirubin, Direct: 0.1 mg/dL (ref 0.0–0.3)
TOTAL PROTEIN: 7.4 g/dL (ref 6.0–8.3)
Total Bilirubin: 0.4 mg/dL (ref 0.2–1.2)

## 2014-03-28 LAB — TSH: TSH: 2.02 u[IU]/mL (ref 0.35–4.50)

## 2014-03-29 ENCOUNTER — Encounter: Payer: Self-pay | Admitting: Family Medicine

## 2014-03-29 NOTE — Progress Notes (Signed)
Subjective:     Barbara Thomas is a 64 y.o. female and is here for a comprehensive physical exam. The patient reports problems - pt requesting osa evaluation-- for snoring and restless sleep.   Marland Kitchen  History   Social History  . Marital Status: Married    Spouse Name: N/A    Number of Children: N/A  . Years of Education: N/A   Occupational History  . housewife    Social History Main Topics  . Smoking status: Never Smoker   . Smokeless tobacco: Never Used  . Alcohol Use: No  . Drug Use: No  . Sexual Activity: Not on file   Other Topics Concern  . Not on file   Social History Narrative   Exercise--- walking--- qd short distance   Health Maintenance  Topic Date Due  . PAP SMEAR  06/03/2014  . INFLUENZA VACCINE  10/13/2014  . MAMMOGRAM  01/23/2016  . COLONOSCOPY  05/16/2016  . TETANUS/TDAP  02/06/2023  . ZOSTAVAX  Completed    The following portions of the patient's history were reviewed and updated as appropriate:  She  has a past medical history of Migraines; History of DVT of lower extremity; History of pulmonary embolus (PE); GERD (gastroesophageal reflux disease); H/O hiatal hernia; Sigmoid diverticulosis; Left ureteral calculus; Arthritis; PONV (postoperative nausea and vomiting); History of basal cell carcinoma excision; Wears glasses; and OSA on CPAP. She  does not have any pertinent problems on file. She  has past surgical history that includes Hip pinning (Left, 1985); BENIGN RIGHT BREAST BX (12-06-2010); Insertion of vena cava filter (12/ 2011); ORIF LEFT ANKLE FX (11/ 2011); Partial hip arthroplasty (Left, 1987); REVISION HIP HEMIARTHROPLASTY  (Left, 2004); Vaginal hysterectomy (2005); Cystoscopy with retrograde pyelogram, ureteroscopy and stent placement (Left, 11/25/2013); and Holmium laser application (Left, 9/40/7680). Her family history includes Cancer in her father; Dementia in her mother; Diabetes in her brother; Hypertension in her father and mother; Stroke in her  paternal grandmother; Sudden death in her maternal grandmother; Transient ischemic attack in her mother. She  reports that she has never smoked. She has never used smokeless tobacco. She reports that she does not drink alcohol or use illicit drugs. She has a current medication list which includes the following prescription(s): acetaminophen, aspirin, calcium carbonate, guaifenesin, loratadine, multivitamin, pantoprazole, and vitamin d (ergocalciferol). Current Outpatient Prescriptions on File Prior to Visit  Medication Sig Dispense Refill  . acetaminophen (TYLENOL) 500 MG tablet Take 1,000 mg by mouth every 6 (six) hours as needed for headache.    Marland Kitchen aspirin 81 MG chewable tablet Chew 162 mg by mouth daily.    . calcium carbonate (OS-CAL) 600 MG TABS Take 600 mg by mouth 2 (two) times daily with a meal.      . guaiFENesin (MUCINEX) 600 MG 12 hr tablet Take 600 mg by mouth 2 (two) times daily as needed for cough.    . loratadine (CLARITIN) 10 MG tablet Take 10 mg by mouth as needed.     . Multiple Vitamin (MULTIVITAMIN) tablet Take 1 tablet by mouth daily.       No current facility-administered medications on file prior to visit.   She is allergic to penicillins and morphine and related..  Review of Systems Review of Systems  Constitutional: Negative for activity change, appetite change and fatigue.  HENT: Negative for hearing loss, congestion, tinnitus and ear discharge.  dentist q58m Eyes: Negative for visual disturbance (see optho q1y -- vision corrected to 20/20 with glasses).  Respiratory:  Negative for cough, chest tightness and shortness of breath.   Cardiovascular: Negative for chest pain, palpitations and leg swelling.  Gastrointestinal: Negative for abdominal pain, diarrhea, constipation and abdominal distention.  Genitourinary: Negative for urgency, frequency, decreased urine volume and difficulty urinating.  Musculoskeletal: Negative for back pain, arthralgias and gait problem.   Skin: Negative for color change, pallor and rash.  Neurological: Negative for dizziness, light-headedness, numbness and headaches.  Hematological: Negative for adenopathy. Does not bruise/bleed easily.  Psychiatric/Behavioral: Negative for suicidal ideas, confusion, sleep disturbance, self-injury, dysphoric mood, decreased concentration and agitation.       Objective:    BP 146/86 mmHg  Pulse 94  Temp(Src) 98.1 F (36.7 C) (Oral)  Ht 5\' 10"  (1.778 m)  Wt 227 lb (102.967 kg)  BMI 32.57 kg/m2  SpO2 98% General appearance: alert, cooperative, appears stated age and no distress Head: Normocephalic, without obvious abnormality, atraumatic Eyes: conjunctivae/corneas clear. PERRL, EOM's intact. Fundi benign. Ears: normal TM's and external ear canals both ears Nose: Nares normal. Septum midline. Mucosa normal. No drainage or sinus tenderness. Throat: lips, mucosa, and tongue normal; teeth and gums normal Neck: no adenopathy, no carotid bruit, no JVD, supple, symmetrical, trachea midline and thyroid not enlarged, symmetric, no tenderness/mass/nodules Back: symmetric, no curvature. ROM normal. No CVA tenderness. Lungs: clear to auscultation bilaterally Breasts: normal appearance, no masses or tenderness Heart: regular rate and rhythm, S1, S2 normal, no murmur, click, rub or gallop Abdomen: soft, non-tender; bowel sounds normal; no masses,  no organomegaly Pelvic: not indicated; status post hysterectomy, negative ROS Extremities: extremities normal, atraumatic, no cyanosis or edema Pulses: 2+ and symmetric Skin: Skin color, texture, turgor normal. No rashes or lesions Lymph nodes: Cervical, supraclavicular, and axillary nodes normal. Neurologic: Alert and oriented X 3, normal strength and tone. Normal symmetric reflexes. Normal coordination and gait Psych-- no depression, no anxiety      Assessment:    Healthy female exam.      Plan:    ghm utd Check labs See After Visit Summary  for Counseling Recommendations    1. Gastroesophageal reflux disease without esophagitis Change med to protonix - pantoprazole (PROTONIX) 40 MG tablet; Take 1 tablet (40 mg total) by mouth daily.  Dispense: 90 tablet; Refill: 3 - POCT urinalysis dipstick - Basic metabolic panel; Future - CBC with Differential; Future - Hepatic function panel; Future - Lipid panel; Future - POCT urinalysis dipstick; Future - TSH; Future  2. Vitamin deficiency  - Vitamin D, Ergocalciferol, (DRISDOL) 50000 UNITS CAPS capsule; Take 1 capsule (50,000 Units total) by mouth every 7 (seven) days. Saturday  Dispense: 30 capsule; Refill: 0 - Basic metabolic panel; Future - CBC with Differential; Future - Hepatic function panel; Future - Lipid panel; Future - POCT urinalysis dipstick; Future - TSH; Future  3. Preventative health care  - POCT urinalysis dipstick - Basic metabolic panel; Future - CBC with Differential; Future - Hepatic function panel; Future - Lipid panel; Future - POCT urinalysis dipstick; Future - TSH; Future  4. OSA on CPAP  - Ambulatory referral to Pulmonology

## 2014-04-03 ENCOUNTER — Other Ambulatory Visit: Payer: Self-pay

## 2014-04-03 DIAGNOSIS — R7309 Other abnormal glucose: Secondary | ICD-10-CM

## 2014-04-03 MED ORDER — SIMVASTATIN 20 MG PO TABS: 20.0000 mg | ORAL_TABLET | Freq: Every day | ORAL | Status: DC

## 2014-04-17 ENCOUNTER — Other Ambulatory Visit (INDEPENDENT_AMBULATORY_CARE_PROVIDER_SITE_OTHER): Payer: 59

## 2014-04-17 DIAGNOSIS — R7309 Other abnormal glucose: Secondary | ICD-10-CM

## 2014-04-17 LAB — HEMOGLOBIN A1C: HEMOGLOBIN A1C: 6.4 % (ref 4.6–6.5)

## 2014-05-05 ENCOUNTER — Institutional Professional Consult (permissible substitution): Payer: 59 | Admitting: Pulmonary Disease

## 2014-05-06 ENCOUNTER — Other Ambulatory Visit: Payer: Self-pay

## 2014-05-06 DIAGNOSIS — K219 Gastro-esophageal reflux disease without esophagitis: Secondary | ICD-10-CM

## 2014-05-06 MED ORDER — PANTOPRAZOLE SODIUM 40 MG PO TBEC: 40.0000 mg | DELAYED_RELEASE_TABLET | Freq: Every day | ORAL | Status: DC

## 2014-05-06 MED ORDER — SIMVASTATIN 20 MG PO TABS
20.0000 mg | ORAL_TABLET | Freq: Every day | ORAL | Status: DC
Start: 2014-05-06 — End: 2014-09-24

## 2014-05-22 ENCOUNTER — Encounter: Payer: Self-pay | Admitting: Pulmonary Disease

## 2014-05-22 ENCOUNTER — Ambulatory Visit (INDEPENDENT_AMBULATORY_CARE_PROVIDER_SITE_OTHER): Payer: 59 | Admitting: Pulmonary Disease

## 2014-05-22 VITALS — BP 123/67 | HR 93 | Temp 97.7°F | Ht 70.0 in | Wt 228.0 lb

## 2014-05-22 DIAGNOSIS — G4733 Obstructive sleep apnea (adult) (pediatric): Secondary | ICD-10-CM | POA: Insufficient documentation

## 2014-05-22 NOTE — Assessment & Plan Note (Signed)
She will call the sleep lab to schedule a mask fitting session. Based on this we will provide her with her mask of choice Supplies will also be continued Does seem that cpap has helped improve her symptoms of daytime fatigue and migraine headaches.  Weight loss encouraged, compliance with goal of at least 4-6 hrs every night is the expectation. Advised against medications with sedative side effects Cautioned against driving when sleepy - understanding that sleepiness will vary on a day to day basis

## 2014-05-22 NOTE — Progress Notes (Signed)
Subjective:    Patient ID: Barbara Thomas, female    DOB: 10/26/1950, 64 y.o.   MRN: [id number]  HPI  64 year old referred for management of OSA. PSG in 05/2010 showed RDI of 47 per hour, lowest desaturation of 69%. PLM's were noted 28 per hour, with PLM arousal index of 12 per hour. OSA was corrected by CPAP of 8 cm. She is a Magazine features editor, initially used a nasal mask but then settled on medium nasal pillows. She reports frequent displacement of pillows during sleep. After using C Pap, she noticed improvement in daytime fatigue and her migraine headaches resolved. Epworth sleepiness score is 3. Bedtime is around 10 PM, sleep latency is minimal, she sleeps on her left side with one pillow, reports 3 nocturnal awakenings and is out of bed by 6:30 AM, feeling refreshed, without dryness of mouth.  She reports DVT in 02/2011 after bilateral ankle fractures, requiring placement of IVC filter. Follow-up venous duplex in 02/2013 was negative   Past Medical History  Diagnosis Date  . Migraines   . History of DVT of lower extremity     11/ 2011  BILATERAL  POST FOOT SURGERY  . History of pulmonary embolus (PE)     12/ 2011   POST FOOT SURGERY  . GERD (gastroesophageal reflux disease)   . H/O hiatal hernia   . Sigmoid diverticulosis   . Left ureteral calculus   . Arthritis   . PONV (postoperative nausea and vomiting)     severe  . History of basal cell carcinoma excision     NOSE  . Wears glasses   . OSA on CPAP     STUDY DONE 2012    Past Surgical History  Procedure Laterality Date  . Hip pinning Left 1985  . Benign right breast bx  12-06-2010  . Insertion of vena cava filter  12/ 2011    ECLIPSE  . Orif left ankle fx  11/ 2011  . Partial hip arthroplasty Left 1987  . Revision hip hemiarthroplasty  Left 2004  . Cystoscopy with retrograde pyelogram, ureteroscopy and stent placement Left 11/25/2013    Procedure: CYSTOSCOPY WITH RETROGRADE PYELOGRAM, URETEROSCOPY AND STENT  PLACEMENT WITH COLD CUP RESECTION OF BLADDER TUMOR ;  Surgeon: Bernestine Amass, MD;  Location[facility];  Service: Urology;  Laterality: Left;  . Holmium laser application Left 05/25/9700    Procedure: HOLMIUM LASER APPLICATION;  Surgeon: Bernestine Amass, MD;  Location: [facility];  Service: Urology;  Laterality: Left;  . Vaginal hysterectomy  2005    W/  BILATERAL SALPINGOOPHORECTOMY AND BLADDER SLING PROCEDURE    Allergies  Allergen Reactions  . Penicillins Rash  . Morphine And Related Nausea And Vomiting    History   Social History  . Marital Status: Married    Spouse Name: N/A  . Number of Children: N/A  . Years of Education: N/A   Occupational History  . housewife    Social History Main Topics  . Smoking status: Never Smoker   . Smokeless tobacco: Never Used  . Alcohol Use: No  . Drug Use: No  . Sexual Activity: Not on file   Other Topics Concern  . Not on file   Social History Narrative   Exercise--- walking--- qd short distance    Family History  Problem Relation Age of Onset  . Transient ischemic attack Mother   . Cancer Father     BLADDER CANCER  . Hypertension Mother   .  Hypertension Father   . Dementia Mother   . Stroke Paternal Grandmother   . Sudden death Maternal Grandmother   . Diabetes Brother       Review of Systems  Constitutional: Negative for fever and unexpected weight change.  HENT: Positive for congestion. Negative for dental problem, ear pain, nosebleeds, postnasal drip, rhinorrhea, sinus pressure, sore throat and trouble swallowing.   Eyes: Negative for redness and itching.  Respiratory: Negative for cough, chest tightness, shortness of breath and wheezing.   Cardiovascular: Negative for palpitations and leg swelling.  Gastrointestinal: Negative for nausea and vomiting.  Genitourinary: Negative for dysuria.  Musculoskeletal: Negative for joint swelling.  Skin: Negative for rash.  Neurological:  Negative for headaches.  Hematological: Does not bruise/bleed easily.  Psychiatric/Behavioral: Negative for dysphoric mood. The patient is not nervous/anxious.        Objective:   Physical Exam  Gen. Pleasant, well-nourished, in no distress, normal affect ENT - no lesions, no post nasal drip Neck: No JVD, no thyromegaly, no carotid bruits Lungs: no use of accessory muscles, no dullness to percussion, clear without rales or rhonchi  Cardiovascular: Rhythm regular, heart sounds  normal, no murmurs or gallops, no peripheral edema Abdomen: soft and non-tender, no hepatosplenomegaly, BS normal. Musculoskeletal: No deformities, no cyanosis or clubbing Neuro:  alert, non focal       Assessment & Plan:

## 2014-05-22 NOTE — Patient Instructions (Signed)
Call [phone] for mask fitting session CPAP supplies will be renewed -mask of choice

## 2014-06-02 ENCOUNTER — Ambulatory Visit (HOSPITAL_BASED_OUTPATIENT_CLINIC_OR_DEPARTMENT_OTHER): Payer: 59 | Attending: Pulmonary Disease | Admitting: Radiology

## 2014-06-02 DIAGNOSIS — Z9989 Dependence on other enabling machines and devices: Principal | ICD-10-CM

## 2014-06-02 DIAGNOSIS — G4733 Obstructive sleep apnea (adult) (pediatric): Secondary | ICD-10-CM

## 2014-07-11 ENCOUNTER — Emergency Department (HOSPITAL_BASED_OUTPATIENT_CLINIC_OR_DEPARTMENT_OTHER): Payer: 59

## 2014-07-11 ENCOUNTER — Emergency Department (HOSPITAL_BASED_OUTPATIENT_CLINIC_OR_DEPARTMENT_OTHER)
Admission: EM | Admit: 2014-07-11 | Discharge: 2014-07-11 | Disposition: A | Discharge status: Home / Self Care | Payer: 59 | Attending: Emergency Medicine | Admitting: Emergency Medicine

## 2014-07-11 ENCOUNTER — Encounter (HOSPITAL_BASED_OUTPATIENT_CLINIC_OR_DEPARTMENT_OTHER): Payer: Self-pay | Admitting: *Deleted

## 2014-07-11 ENCOUNTER — Telehealth: Payer: Self-pay | Admitting: *Deleted

## 2014-07-11 DIAGNOSIS — Z8679 Personal history of other diseases of the circulatory system: Secondary | ICD-10-CM | POA: Diagnosis not present

## 2014-07-11 DIAGNOSIS — Z79899 Other long term (current) drug therapy: Secondary | ICD-10-CM | POA: Insufficient documentation

## 2014-07-11 DIAGNOSIS — Z86718 Personal history of other venous thrombosis and embolism: Secondary | ICD-10-CM | POA: Diagnosis not present

## 2014-07-11 DIAGNOSIS — G4733 Obstructive sleep apnea (adult) (pediatric): Secondary | ICD-10-CM | POA: Insufficient documentation

## 2014-07-11 DIAGNOSIS — R509 Fever, unspecified: Secondary | ICD-10-CM

## 2014-07-11 DIAGNOSIS — Z880 Allergy status to penicillin: Secondary | ICD-10-CM | POA: Diagnosis not present

## 2014-07-11 DIAGNOSIS — R112 Nausea with vomiting, unspecified: Secondary | ICD-10-CM | POA: Insufficient documentation

## 2014-07-11 DIAGNOSIS — Z87442 Personal history of urinary calculi: Secondary | ICD-10-CM | POA: Diagnosis not present

## 2014-07-11 DIAGNOSIS — Z7982 Long term (current) use of aspirin: Secondary | ICD-10-CM | POA: Insufficient documentation

## 2014-07-11 DIAGNOSIS — Z85828 Personal history of other malignant neoplasm of skin: Secondary | ICD-10-CM | POA: Diagnosis not present

## 2014-07-11 DIAGNOSIS — M1990 Unspecified osteoarthritis, unspecified site: Secondary | ICD-10-CM | POA: Diagnosis not present

## 2014-07-11 DIAGNOSIS — Z86711 Personal history of pulmonary embolism: Secondary | ICD-10-CM | POA: Insufficient documentation

## 2014-07-11 DIAGNOSIS — R51 Headache: Secondary | ICD-10-CM | POA: Insufficient documentation

## 2014-07-11 DIAGNOSIS — Z9981 Dependence on supplemental oxygen: Secondary | ICD-10-CM | POA: Insufficient documentation

## 2014-07-11 DIAGNOSIS — K219 Gastro-esophageal reflux disease without esophagitis: Secondary | ICD-10-CM | POA: Insufficient documentation

## 2014-07-11 DIAGNOSIS — R519 Headache, unspecified: Secondary | ICD-10-CM

## 2014-07-11 LAB — LIPASE, BLOOD: Lipase: 30 U/L (ref 11–59)

## 2014-07-11 LAB — CBC WITH DIFFERENTIAL/PLATELET
BASOS ABS: 0 10*3/uL (ref 0.0–0.1)
Basophils Relative: 0 % (ref 0–1)
EOS PCT: 0 % (ref 0–5)
Eosinophils Absolute: 0 10*3/uL (ref 0.0–0.7)
HEMATOCRIT: 44 % (ref 36.0–46.0)
HEMOGLOBIN: 14.7 g/dL (ref 12.0–15.0)
LYMPHS PCT: 6 % — AB (ref 12–46)
Lymphs Abs: 0.4 10*3/uL — ABNORMAL LOW (ref 0.7–4.0)
MCH: 28.4 pg (ref 26.0–34.0)
MCHC: 33.4 g/dL (ref 30.0–36.0)
MCV: 85.1 fL (ref 78.0–100.0)
MONO ABS: 0.6 10*3/uL (ref 0.1–1.0)
MONOS PCT: 9 % (ref 3–12)
NEUTROS PCT: 85 % — AB (ref 43–77)
Neutro Abs: 6.2 10*3/uL (ref 1.7–7.7)
PLATELETS: 228 10*3/uL (ref 150–400)
RBC: 5.17 MIL/uL — AB (ref 3.87–5.11)
RDW: 13.8 % (ref 11.5–15.5)
WBC: 7.3 10*3/uL (ref 4.0–10.5)

## 2014-07-11 LAB — COMPREHENSIVE METABOLIC PANEL
ALBUMIN: 4.4 g/dL (ref 3.5–5.2)
ALK PHOS: 111 U/L (ref 39–117)
ALT: 30 U/L (ref 0–35)
AST: 24 U/L (ref 0–37)
Anion gap: 7 (ref 5–15)
BUN: 14 mg/dL (ref 6–23)
CALCIUM: 9.2 mg/dL (ref 8.4–10.5)
CHLORIDE: 105 mmol/L (ref 96–112)
CO2: 25 mmol/L (ref 19–32)
CREATININE: 0.86 mg/dL (ref 0.50–1.10)
GFR calc Af Amer: 82 mL/min — ABNORMAL LOW (ref >90)
GFR, EST NON AFRICAN AMERICAN: 71 mL/min — AB (ref >90)
Glucose, Bld: 146 mg/dL — ABNORMAL HIGH (ref 70–99)
Potassium: 3.8 mmol/L (ref 3.5–5.1)
Sodium: 137 mmol/L (ref 135–145)
Total Bilirubin: 0.6 mg/dL (ref 0.3–1.2)
Total Protein: 7.9 g/dL (ref 6.0–8.3)

## 2014-07-11 MED ORDER — PROCHLORPERAZINE EDISYLATE 5 MG/ML IJ SOLN
10.0000 mg | Freq: Once | INTRAMUSCULAR | Status: AC
  Administered 2014-07-11: 10 mg via INTRAVENOUS
  Filled 2014-07-11: qty 2

## 2014-07-11 MED ORDER — SODIUM CHLORIDE 0.9 % IV BOLUS (SEPSIS)
1000.0000 mL | Freq: Once | INTRAVENOUS | Status: AC
  Administered 2014-07-11: 1000 mL via INTRAVENOUS

## 2014-07-11 MED ORDER — FENTANYL CITRATE (PF) 100 MCG/2ML IJ SOLN
50.0000 ug | Freq: Once | INTRAMUSCULAR | Status: AC
  Administered 2014-07-11: 50 ug via INTRAVENOUS
  Filled 2014-07-11: qty 2

## 2014-07-11 MED ORDER — KETOROLAC TROMETHAMINE 30 MG/ML IJ SOLN
30.0000 mg | Freq: Once | INTRAMUSCULAR | Status: AC
  Administered 2014-07-11: 30 mg via INTRAVENOUS
  Filled 2014-07-11: qty 1

## 2014-07-11 MED ORDER — TRIAMCINOLONE ACETONIDE 55 MCG/ACT NA AERO: 2.0000 | INHALATION_SPRAY | Freq: Every day | NASAL | Status: DC

## 2014-07-11 MED ORDER — SODIUM CHLORIDE 0.9 % IV SOLN
Freq: Once | INTRAVENOUS | Status: AC
  Administered 2014-07-11: 07:00:00 via INTRAVENOUS

## 2014-07-11 MED ORDER — ACETAMINOPHEN 325 MG PO TABS
650.0000 mg | ORAL_TABLET | Freq: Once | ORAL | Status: AC
  Administered 2014-07-11: 650 mg via ORAL
  Filled 2014-07-11: qty 2

## 2014-07-11 MED ORDER — PSEUDOEPHEDRINE HCL ER 120 MG PO TB12: 120.0000 mg | ORAL_TABLET | Freq: Two times a day (BID) | ORAL | Status: AC

## 2014-07-11 MED ORDER — DIPHENHYDRAMINE HCL 50 MG/ML IJ SOLN
25.0000 mg | Freq: Once | INTRAMUSCULAR | Status: AC
  Administered 2014-07-11: 25 mg via INTRAVENOUS
  Filled 2014-07-11: qty 1

## 2014-07-11 MED ORDER — METOCLOPRAMIDE HCL 5 MG/ML IJ SOLN
10.0000 mg | Freq: Once | INTRAMUSCULAR | Status: AC
  Administered 2014-07-11: 10 mg via INTRAVENOUS
  Filled 2014-07-11: qty 2

## 2014-07-11 MED ORDER — ONDANSETRON HCL 4 MG/2ML IJ SOLN
4.0000 mg | Freq: Once | INTRAMUSCULAR | Status: AC
  Administered 2014-07-11: 4 mg via INTRAVENOUS
  Filled 2014-07-11: qty 2

## 2014-07-11 NOTE — ED Notes (Addendum)
C/o HA & sore throat onset Wednesday night. Developed nv Thursday morning. Also reports cough, congestion, fever, HA, post nasal drip, emesis looks like yellow bile. Rates HA 10/10, "throat feels better now than it did". Alert, NAD, calm, interactive, resps e/u, no dyspnea noted, steady gait to room. Husband at [facility]. C/c now is HA.

## 2014-07-11 NOTE — ED Notes (Signed)
Sprite given per pt request.

## 2014-07-11 NOTE — ED Provider Notes (Signed)
CSN: [id number]     Arrival date & time 07/11/14  8889 History   First MD Initiated Contact with Patient 07/11/14 [phone]     Chief Complaint  Patient presents with  . Headache     HPI  Patient presents with 2 days of generalized illness. She was generally well prior to the onset of symptoms. About 36 hours ago she developed sore throat, subsequently headache, nausea, cough, congestion, fever, chills. Headache has been present since that time, is diffuse, pressure related.  There is no associated visual loss, confusion, disorientation, syncope. Patient notes that after eating she develops nausea immediately, vomiting about one hour later. No abdominal pain, chest pain. No clear intervention, though she feels marginally better today. Patient does have a history of migraines, states that this is an atypical headache. She has a history of DVT, pulmonary embolism, both associated with surgery. She reiterates, no lower extremity swelling now, no dyspnea now.    Past Medical History  Diagnosis Date  . Migraines   . History of DVT of lower extremity     11/ 2011  BILATERAL  POST FOOT SURGERY  . History of pulmonary embolus (PE)     12/ 2011   POST FOOT SURGERY  . GERD (gastroesophageal reflux disease)   . H/O hiatal hernia   . Sigmoid diverticulosis   . Left ureteral calculus   . Arthritis   . PONV (postoperative nausea and vomiting)     severe  . History of basal cell carcinoma excision     NOSE  . Wears glasses   . OSA on CPAP     STUDY DONE 2012   Past Surgical History  Procedure Laterality Date  . Hip pinning Left 1985  . Benign right breast bx  12-06-2010  . Insertion of vena cava filter  12/ 2011    ECLIPSE  . Orif left ankle fx  11/ 2011  . Partial hip arthroplasty Left 1987  . Revision hip hemiarthroplasty  Left 2004  . Cystoscopy with retrograde pyelogram, ureteroscopy and stent placement Left 11/25/2013    Procedure: CYSTOSCOPY WITH RETROGRADE PYELOGRAM,  URETEROSCOPY AND STENT PLACEMENT WITH COLD CUP RESECTION OF BLADDER TUMOR ;  Surgeon: Bernestine Amass, MD;  Location: [facility];  Service: Urology;  Laterality: Left;  . Holmium laser application Left 07/14/8880    Procedure: HOLMIUM LASER APPLICATION;  Surgeon: Bernestine Amass, MD;  Location: [facility];  Service: Urology;  Laterality: Left;  . Vaginal hysterectomy  2005    W/  BILATERAL SALPINGOOPHORECTOMY AND BLADDER SLING PROCEDURE   Family History  Problem Relation Age of Onset  . Transient ischemic attack Mother   . Cancer Father     BLADDER CANCER  . Hypertension Mother   . Hypertension Father   . Dementia Mother   . Stroke Paternal Grandmother   . Sudden death Maternal Grandmother   . Diabetes Brother    History  Substance Use Topics  . Smoking status: Never Smoker   . Smokeless tobacco: Never Used  . Alcohol Use: No   OB History    No data available     Review of Systems  Constitutional:       Per HPI, otherwise negative  HENT:       Per HPI, otherwise negative  Respiratory:       Per HPI, otherwise negative  Cardiovascular:       Per HPI, otherwise negative  Gastrointestinal: Positive for nausea and vomiting.  Endocrine:       Negative aside from HPI  Genitourinary:       Neg aside from HPI   Musculoskeletal:       Per HPI, otherwise negative  Skin: Negative.   Neurological: Negative for syncope.      Allergies  Penicillins and Morphine and related  Home Medications   Prior to Admission medications   Medication Sig Start Date End Date Taking? Authorizing Provider  acetaminophen (TYLENOL) 500 MG tablet Take 1,000 mg by mouth every 6 (six) hours as needed for headache.    Historical Provider, MD  aspirin 81 MG chewable tablet Chew 162 mg by mouth daily.    Historical Provider, MD  calcium carbonate (OS-CAL) 600 MG TABS Take 600 mg by mouth 2 (two) times daily with a meal.      Historical Provider, MD  co-enzyme Q-10 30  MG capsule Take 30 mg by mouth daily.    Historical Provider, MD  guaiFENesin (MUCINEX) 600 MG 12 hr tablet Take 600 mg by mouth 2 (two) times daily as needed for cough.    Historical Provider, MD  loratadine (CLARITIN) 10 MG tablet Take 10 mg by mouth as needed.     Historical Provider, MD  Multiple Vitamin (MULTIVITAMIN) tablet Take 1 tablet by mouth daily.      Historical Provider, MD  pantoprazole (PROTONIX) 40 MG tablet Take 1 tablet (40 mg total) by mouth daily. 05/06/14   Rosalita Chessman, DO  simvastatin (ZOCOR) 20 MG tablet Take 1 tablet (20 mg total) by mouth at bedtime. 05/06/14   Rosalita Chessman, DO  Vitamin D, Ergocalciferol, (DRISDOL) 50000 UNITS CAPS capsule Take 1 capsule (50,000 Units total) by mouth every 7 (seven) days. Saturday 03/27/14   Alferd Apa Lowne, DO   BP 119/57 mmHg  Pulse 100  Temp(Src) 100.1 F (37.8 C) (Oral)  Resp 20  Ht 5' 10.5" (1.791 m)  Wt 221 lb (100.245 kg)  BMI 31.25 kg/m2  SpO2 96% Physical Exam  Constitutional: She is oriented to person, place, and time. She appears well-developed and well-nourished. No distress.  HENT:  Head: Normocephalic and atraumatic.  Eyes: Conjunctivae and EOM are normal.  Cardiovascular: Normal rate and regular rhythm.   Pulmonary/Chest: Effort normal and breath sounds normal. No stridor. No respiratory distress.  Abdominal: She exhibits no distension.  Musculoskeletal: She exhibits no edema.  Neurological: She is alert and oriented to person, place, and time. No cranial nerve deficit. She exhibits normal muscle tone. Coordination normal.  Skin: Skin is warm and dry.  Psychiatric: She has a normal mood and affect.  Nursing note and vitals reviewed.   ED Course  Procedures (including critical care time) Labs Review Labs Reviewed  CBC WITH DIFFERENTIAL/PLATELET - Abnormal; Notable for the following:    RBC 5.17 (*)    Neutrophils Relative % 85 (*)    Lymphocytes Relative 6 (*)    Lymphs Abs 0.4 (*)    All other  components within normal limits  COMPREHENSIVE METABOLIC PANEL - Abnormal; Notable for the following:    Glucose, Bld 146 (*)    GFR calc non Af Amer 71 (*)    GFR calc Af Amer 82 (*)    All other components within normal limits  LIPASE, BLOOD    Imaging Review Dg Chest 2 View  07/11/2014   CLINICAL DATA:  64 year old female with headache and sore throat for 2 days. Nausea vomiting since yesterday. Cough congestion and fever. Initial encounter.  EXAM: CHEST  2 VIEW  COMPARISON:  06/09/2011.  FINDINGS: Lower lung volumes. Chronic indistinct pulmonary opacity at both lung bases, with mild tenting of the inferior pulmonary ligaments on the lateral view, stable. No superimposed pneumothorax, pulmonary edema, pleural effusion or acute pulmonary opacity. Osteopenia. Chronic chondroid sclerotic opacity in the left humeral head is stable. No acute osseous abnormality identified.  IMPRESSION: Lower lung volumes, otherwise stable chest. No acute cardiopulmonary abnormality.   Electronically Signed   By: Genevie Ann M.D.   On: 07/11/2014 07:44    Pulse ox 100% room air normal  8:36 AM HA persists Patient has received Toradol, Compazine, fluids. She will receive Reglan, Benadryl.  Fentanyl also provided   11:29 AM Headache and minimal. We discussed all findings, with the patient's husband. With the mild fever, no headache, there is some concern for sinusitis.  MDM   Final diagnoses:  Bad headache  Fever and chills   patient presents with headache. Patient does have history of migraines, but given the fever, mild tachycardia, infectious etiology is considered. No evidence for pneumonia, nor for systemic illness, bacteremia, sepsis. No neurologic dysfunction. Patient had substantial improvement here with fluid, multiple medications, including Tylenol for fever. With no evidence for systemic progression, patient was appropriate for discharge with outpatient management after initiation of  medication.   Carmin Muskrat, MD 07/11/14 1130

## 2014-07-11 NOTE — Discharge Instructions (Signed)
As discussed, with your fever, headache, generalized discomfort, UR likely suffering from a viral infection, possibly sinusitis.  Is important to take all medication as directed, monitor your condition carefully, and do not hesitate to return here if you develop new, or concerning changes in your condition.

## 2014-07-11 NOTE — Telephone Encounter (Signed)
Patient reports improvement of symptoms.  She would like to follow-up with Dr. Etter Sjogren.  Appointment made for 07/15/14 for ED follow-up, OK per Maudie Mercury

## 2014-07-15 ENCOUNTER — Ambulatory Visit (INDEPENDENT_AMBULATORY_CARE_PROVIDER_SITE_OTHER): Payer: 59 | Admitting: Family Medicine

## 2014-07-15 ENCOUNTER — Encounter: Payer: Self-pay | Admitting: Family Medicine

## 2014-07-15 VITALS — BP 152/86 | HR 81 | Temp 98.2°F | Wt 222.6 lb

## 2014-07-15 DIAGNOSIS — E785 Hyperlipidemia, unspecified: Secondary | ICD-10-CM | POA: Diagnosis not present

## 2014-07-15 DIAGNOSIS — J302 Other seasonal allergic rhinitis: Secondary | ICD-10-CM | POA: Diagnosis not present

## 2014-07-15 DIAGNOSIS — R059 Cough, unspecified: Secondary | ICD-10-CM

## 2014-07-15 DIAGNOSIS — R05 Cough: Secondary | ICD-10-CM | POA: Diagnosis not present

## 2014-07-15 MED ORDER — PREDNISONE 10 MG PO TABS: ORAL_TABLET | ORAL | Status: DC

## 2014-07-15 MED ORDER — METHYLPREDNISOLONE ACETATE 80 MG/ML IJ SUSP
80.0000 mg | Freq: Once | INTRAMUSCULAR | Status: AC
  Administered 2014-07-15: 80 mg via INTRAMUSCULAR

## 2014-07-15 MED ORDER — PROMETHAZINE-DM 6.25-15 MG/5ML PO SYRP: ORAL_SOLUTION | ORAL | Status: DC

## 2014-07-15 NOTE — Progress Notes (Signed)
Subjective:     Barbara Thomas is a 64 y.o. female who presents for evaluation of symptoms of a URI. Symptoms include congestion, facial pain, lightheadedness, nasal congestion, nausea with vomiting, post nasal drip, productive cough with  clear colored sputum, sinus pressure and sneezing. Onset of symptoms was 1 week ago, and has been stable since that time. Treatment to date: antihistamines, decongestants and nasal steroids.  The following portions of the patient's history were reviewed and updated as appropriate:  She  has a past medical history of Migraines; History of DVT of lower extremity; History of pulmonary embolus (PE); GERD (gastroesophageal reflux disease); H/O hiatal hernia; Sigmoid diverticulosis; Left ureteral calculus; Arthritis; PONV (postoperative nausea and vomiting); History of basal cell carcinoma excision; Wears glasses; and OSA on CPAP. She  does not have any pertinent problems on file. She  has past surgical history that includes Hip pinning (Left, 1985); BENIGN RIGHT BREAST BX (12-06-2010); Insertion of vena cava filter (12/ 2011); ORIF LEFT ANKLE FX (11/ 2011); Partial hip arthroplasty (Left, 1987); REVISION HIP HEMIARTHROPLASTY  (Left, 2004); Cystoscopy with retrograde pyelogram, ureteroscopy and stent placement (Left, 11/25/2013); Holmium laser application (Left, 7/67/3419); and Vaginal hysterectomy (2005). Her family history includes Cancer in her father; Dementia in her mother; Diabetes in her brother; Hypertension in her father and mother; Stroke in her paternal grandmother; Sudden death in her maternal grandmother; Transient ischemic attack in her mother. She  reports that she has never smoked. She has never used smokeless tobacco. She reports that she does not drink alcohol or use illicit drugs. She has a current medication list which includes the following prescription(s): acetaminophen, aspirin, calcium carbonate, cetirizine, co-enzyme q-10, guaifenesin, multivitamin,  pantoprazole, simvastatin, triamcinolone, and vitamin d (ergocalciferol). Current Outpatient Prescriptions on File Prior to Visit  Medication Sig Dispense Refill  . acetaminophen (TYLENOL) 500 MG tablet Take 1,000 mg by mouth every 6 (six) hours as needed for headache.    Marland Kitchen aspirin 81 MG chewable tablet Chew 162 mg by mouth daily.    . calcium carbonate (OS-CAL) 600 MG TABS Take 600 mg by mouth 2 (two) times daily with a meal.      . co-enzyme Q-10 30 MG capsule Take 30 mg by mouth daily.    Marland Kitchen guaiFENesin (MUCINEX) 600 MG 12 hr tablet Take 600 mg by mouth 2 (two) times daily as needed for cough.    . Multiple Vitamin (MULTIVITAMIN) tablet Take 1 tablet by mouth daily.      . pantoprazole (PROTONIX) 40 MG tablet Take 1 tablet (40 mg total) by mouth daily. 90 tablet 3  . simvastatin (ZOCOR) 20 MG tablet Take 1 tablet (20 mg total) by mouth at bedtime. 90 tablet 1  . triamcinolone (NASACORT AQ) 55 MCG/ACT AERO nasal inhaler Place 2 sprays into the nose daily. 1 Inhaler 0  . Vitamin D, Ergocalciferol, (DRISDOL) 50000 UNITS CAPS capsule Take 1 capsule (50,000 Units total) by mouth every 7 (seven) days. Saturday 30 capsule 0   No current facility-administered medications on file prior to visit.   She is allergic to penicillins and morphine and related..  Review of Systems Pertinent items are noted in HPI.   Objective:    BP 152/86 mmHg  Pulse 81  Temp(Src) 98.2 F (36.8 C) (Oral)  Wt 222 lb 9.6 oz (100.971 kg)  SpO2 98% General appearance: alert, cooperative, appears stated age and no distress Ears: normal TM's and external ear canals both ears Nose: clear discharge, mild congestion, turbinates red, swollen, sinus  tenderness bilateral Throat: abnormal findings: mild oropharyngeal erythema Neck: no adenopathy, supple, symmetrical, trachea midline and thyroid not enlarged, symmetric, no tenderness/mass/nodules Lungs: clear to auscultation bilaterally Heart: S1, S2 normal Extremities:  extremities normal, atraumatic, no cyanosis or edema   Assessment:    allergic rhinitis   Plan:    Discussed diagnosis and treatment of URI. Suggested symptomatic OTC remedies. Nasal steroids per orders. Follow up as needed.

## 2014-07-15 NOTE — Patient Instructions (Signed)

## 2014-07-15 NOTE — Progress Notes (Signed)
Pre visit review using our clinic review tool, if applicable. No additional management support is needed unless otherwise documented below in the visit note. 

## 2014-09-15 ENCOUNTER — Other Ambulatory Visit: Payer: Self-pay | Admitting: Family Medicine

## 2014-09-24 ENCOUNTER — Other Ambulatory Visit: Payer: 59

## 2014-09-24 ENCOUNTER — Telehealth: Payer: Self-pay | Admitting: Family Medicine

## 2014-09-24 LAB — HEPATIC FUNCTION PANEL
ALK PHOS: 91 U/L (ref 39–117)
ALT: 20 U/L (ref 0–35)
AST: 17 U/L (ref 0–37)
Albumin: 4.2 g/dL (ref 3.5–5.2)
Bilirubin, Direct: 0.1 mg/dL (ref 0.0–0.3)
Total Bilirubin: 0.5 mg/dL (ref 0.2–1.2)
Total Protein: 7.3 g/dL (ref 6.0–8.3)

## 2014-09-24 LAB — LIPID PANEL
Cholesterol: 176 mg/dL (ref 0–200)
HDL: 59.5 mg/dL (ref >39.00)
LDL Cholesterol: 99 mg/dL (ref 0–99)
NONHDL: 116.5
Total CHOL/HDL Ratio: 3
Triglycerides: 89 mg/dL (ref 0.0–149.0)
VLDL: 17.8 mg/dL (ref 0.0–40.0)

## 2014-09-24 LAB — BASIC METABOLIC PANEL
BUN: 19 mg/dL (ref 6–23)
CO2: 28 meq/L (ref 19–32)
CREATININE: 1.08 mg/dL (ref 0.40–1.20)
Calcium: 10 mg/dL (ref 8.4–10.5)
Chloride: 104 mEq/L (ref 96–112)
GFR: 54.48 mL/min — ABNORMAL LOW (ref >60.00)
GLUCOSE: 99 mg/dL (ref 70–99)
POTASSIUM: 4 meq/L (ref 3.5–5.1)
Sodium: 141 mEq/L (ref 135–145)

## 2014-09-24 MED ORDER — SIMVASTATIN 20 MG PO TABS: 20.0000 mg | ORAL_TABLET | Freq: Every day | ORAL | Status: DC

## 2014-09-24 NOTE — Telephone Encounter (Signed)
Pt came in for lab work. She said that she needs to have simvastatin sent to [facility], Fax # [phone] eprescribe Or call them at [phone]

## 2014-09-24 NOTE — Telephone Encounter (Signed)
Rx faxed.    KP 

## 2014-12-31 ENCOUNTER — Other Ambulatory Visit: Payer: Self-pay | Admitting: Family Medicine

## 2014-12-31 NOTE — Telephone Encounter (Signed)
Last vitamin d was 12/14/12. Please advise if this refill is appropriate.      KP

## 2015-01-30 LAB — HM MAMMOGRAPHY

## 2015-02-17 ENCOUNTER — Encounter: Payer: Self-pay | Admitting: Family Medicine

## 2015-02-25 ENCOUNTER — Other Ambulatory Visit: Payer: Self-pay | Admitting: Family Medicine

## 2015-03-14 ENCOUNTER — Other Ambulatory Visit: Payer: Self-pay | Admitting: Family Medicine

## 2015-07-30 ENCOUNTER — Emergency Department (INDEPENDENT_AMBULATORY_CARE_PROVIDER_SITE_OTHER)
Admission: EM | Admit: 2015-07-30 | Discharge: 2015-07-30 | Disposition: A | Discharge status: Home / Self Care | Payer: 59 | Source: Home / Self Care | Attending: Family Medicine | Admitting: Family Medicine

## 2015-07-30 ENCOUNTER — Encounter: Payer: Self-pay | Admitting: Emergency Medicine

## 2015-07-30 ENCOUNTER — Emergency Department (INDEPENDENT_AMBULATORY_CARE_PROVIDER_SITE_OTHER): Payer: 59

## 2015-07-30 DIAGNOSIS — R0989 Other specified symptoms and signs involving the circulatory and respiratory systems: Secondary | ICD-10-CM

## 2015-07-30 DIAGNOSIS — R05 Cough: Secondary | ICD-10-CM

## 2015-07-30 DIAGNOSIS — J209 Acute bronchitis, unspecified: Secondary | ICD-10-CM | POA: Diagnosis not present

## 2015-07-30 DIAGNOSIS — J9801 Acute bronchospasm: Secondary | ICD-10-CM | POA: Diagnosis not present

## 2015-07-30 MED ORDER — PREDNISONE 20 MG PO TABS: ORAL_TABLET | ORAL | Status: DC

## 2015-07-30 MED ORDER — METHYLPREDNISOLONE SODIUM SUCC 125 MG IJ SOLR
80.0000 mg | Freq: Once | INTRAMUSCULAR | Status: AC
  Administered 2015-07-30: 80 mg via INTRAMUSCULAR

## 2015-07-30 MED ORDER — AZITHROMYCIN 250 MG PO TABS: ORAL_TABLET | ORAL | Status: DC

## 2015-07-30 MED ORDER — BENZONATATE 200 MG PO CAPS: 200.0000 mg | ORAL_CAPSULE | Freq: Every day | ORAL | Status: DC

## 2015-07-30 NOTE — Discharge Instructions (Signed)
Start prednisone Friday, May 19. Take plain guaifenesin (1200mg  extended release tabs such as Mucinex) twice daily, with plenty of water, for cough and congestion.  May add Pseudoephedrine (30mg , one or two every 4 to 6 hours) for sinus congestion.  Get adequate rest.   May use Afrin nasal spray (or generic oxymetazoline) twice daily for about 5 days and then discontinue.  Also recommend using saline nasal spray several times daily and saline nasal irrigation (AYR is a common brand).  Use Flonase nasal spray each morning after using Afrin nasal spray and saline nasal irrigation. Try warm salt water gargles for sore throat.  Stop all antihistamines for now, and other non-prescription cough/cold preparations.  Follow-up with family doctor if not improving about 7 to 10 days.

## 2015-07-30 NOTE — ED Provider Notes (Signed)
CSN: [id number]     Arrival date & time 07/30/15  1152 History   First MD Initiated Contact with Patient 07/30/15 1221     Chief Complaint  Patient presents with  . Cough      HPI Comments: About 10 days ago patient developed typical cold-like symptoms developing over several days,  including mild sore throat, sinus congestion, headache, fatigue, and cough.  Her cough has persisted and become non-productive, worse at night.  She complains of persistent fatigue, sweats, and shortness of breath with activity.  No pleuritic pain.   She has a past history of pneumonia about 2 years ago.  She states that her colds always last a long time.  No history of asthma, but her son has asthma.  The history is provided by the patient.    Past Medical History  Diagnosis Date  . Migraines   . History of DVT of lower extremity     11/ 2011  BILATERAL  POST FOOT SURGERY  . History of pulmonary embolus (PE)     12/ 2011   POST FOOT SURGERY  . GERD (gastroesophageal reflux disease)   . H/O hiatal hernia   . Sigmoid diverticulosis   . Left ureteral calculus   . Arthritis   . PONV (postoperative nausea and vomiting)     severe  . History of basal cell carcinoma excision     NOSE  . Wears glasses   . OSA on CPAP     STUDY DONE 2012   Past Surgical History  Procedure Laterality Date  . Hip pinning Left 1985  . Benign right breast bx  12-06-2010  . Insertion of vena cava filter  12/ 2011    ECLIPSE  . Orif left ankle fx  11/ 2011  . Partial hip arthroplasty Left 1987  . Revision hip hemiarthroplasty  Left 2004  . Cystoscopy with retrograde pyelogram, ureteroscopy and stent placement Left 11/25/2013    Procedure: CYSTOSCOPY WITH RETROGRADE PYELOGRAM, URETEROSCOPY AND STENT PLACEMENT WITH COLD CUP RESECTION OF BLADDER TUMOR ;  Surgeon: Bernestine Amass, MD;  Location[facility];  Service: Urology;  Laterality: Left;  . Holmium laser application Left [id number]    Procedure: HOLMIUM  LASER APPLICATION;  Surgeon: Bernestine Amass, MD;  Location: [facility];  Service: Urology;  Laterality: Left;  . Vaginal hysterectomy  2005    W/  BILATERAL SALPINGOOPHORECTOMY AND BLADDER SLING PROCEDURE   Family History  Problem Relation Age of Onset  . Transient ischemic attack Mother   . Cancer Father     BLADDER CANCER  . Hypertension Mother   . Hypertension Father   . Dementia Mother   . Stroke Paternal Grandmother   . Sudden death Maternal Grandmother   . Diabetes Brother    Social History  Substance Use Topics  . Smoking status: Never Smoker   . Smokeless tobacco: Never Used  . Alcohol Use: No   OB History    No data available     Review of Systems + sore throat + cough No pleuritic pain No wheezing + nasal congestion + post-nasal drainage No sinus pain/pressure No itchy/red eyes No earache No hemoptysis + SOB with activity No fever, + chills/sweats No nausea No vomiting No abdominal pain No diarrhea No urinary symptoms No skin rash + fatigue No myalgias + headache Used OTC meds without relief  Allergies  Penicillins and Morphine and related  Home Medications   Prior to Admission medications  Medication Sig Start Date End Date Taking? Authorizing Provider  acetaminophen (TYLENOL) 500 MG tablet Take 1,000 mg by mouth every 6 (six) hours as needed for headache.    Historical Provider, MD  aspirin 81 MG chewable tablet Chew 162 mg by mouth daily.    Historical Provider, MD  azithromycin (ZITHROMAX Z-PAK) 250 MG tablet Take 2 tabs today; then begin one tab once daily for 4 more days. 07/30/15   Kandra Nicolas, MD  benzonatate (TESSALON) 200 MG capsule Take 1 capsule (200 mg total) by mouth at bedtime. Take as needed for cough 07/30/15   Kandra Nicolas, MD  calcium carbonate (OS-CAL) 600 MG TABS Take 600 mg by mouth 2 (two) times daily with a meal.      Historical Provider, MD  cetirizine (ZYRTEC) 10 MG tablet Take 10 mg by mouth daily.     Historical Provider, MD  co-enzyme Q-10 30 MG capsule Take 30 mg by mouth daily.    Historical Provider, MD  guaiFENesin (MUCINEX) 600 MG 12 hr tablet Take 600 mg by mouth 2 (two) times daily as needed for cough.    Historical Provider, MD  Multiple Vitamin (MULTIVITAMIN) tablet Take 1 tablet by mouth daily.      Historical Provider, MD  pantoprazole (PROTONIX) 40 MG tablet Take 1 tablet by mouth  daily 03/17/15   Rosalita Chessman Chase, DO  predniSONE (DELTASONE) 20 MG tablet Take one tab by mouth twice daily for 4 days, then one daily for 3 days. Take with food. 07/30/15   Kandra Nicolas, MD  simvastatin (ZOCOR) 20 MG tablet Take 1 tablet by mouth at  bedtime 02/25/15   Rosalita Chessman Chase, DO  triamcinolone (NASACORT AQ) 55 MCG/ACT AERO nasal inhaler Place 2 sprays into the nose daily. 07/11/14 07/15/14  Carmin Muskrat, MD  Vitamin D, Ergocalciferol, (DRISDOL) 50000 UNITS CAPS capsule TAKE 1 CAPSULE (50,000 UNITS TOTAL) BY MOUTH EVERY 7 (SEVEN) DAYS. SATURDAY 12/31/14   Rosalita Chessman Chase, DO   Meds Ordered and Administered this Visit   Medications  methylPREDNISolone sodium succinate (SOLU-MEDROL) 125 mg/2 mL injection 80 mg (not administered)    BP 134/84 mmHg  Pulse 89  Temp(Src) 98.6 F (37 C) (Oral)  Ht 5\' 10"  (1.778 m)  Wt 224 lb (101.606 kg)  BMI 32.14 kg/m2  SpO2 96% No data found.   Physical Exam Nursing notes and Vital Signs reviewed. Appearance:  Patient appears stated age, and in no acute distress.  Patient is obese (BMI 32.1) Eyes:  Pupils are equal, round, and reactive to light and accomodation.  Extraocular movement is intact.  Conjunctivae are not inflamed  Ears:  Canals normal.  Tympanic membranes normal.  Nose:  Mildly congested turbinates.  No sinus tenderness.    Pharynx:  Normal Neck:  Supple.  Tender enlarged posterior/lateral nodes are palpated bilaterally  Lungs:  Bilateral rhonchi; no rales.  Breath sounds are equal.  Moving air well. Heart:  Regular  rate and rhythm without murmurs, rubs, or gallops.  Abdomen:  Nontender without masses or hepatosplenomegaly.  Bowel sounds are present.  No CVA or flank tenderness.  Extremities:  No edema.  Skin:  No rash present.   ED Course  Procedures  None  Imaging Review Dg Chest 2 View  07/30/2015  CLINICAL DATA:  65 year old female with cough and congestion for 10 days. Initial encounter. EXAM: CHEST  2 VIEW COMPARISON:  07/11/2014 and earlier. FINDINGS: Lung volumes are stable since 2013. Normal  cardiac size and mediastinal contours. Visualized tracheal air column is within normal limits. No pneumothorax, pulmonary edema, pleural effusion or acute pulmonary opacity. No acute osseous abnormality identified. IVC filter re - demonstrated in the abdomen. IMPRESSION: No acute cardiopulmonary abnormality. Electronically Signed   By: Genevie Ann M.D.   On: 07/30/2015 13:10       MDM   1. Acute bronchitis, unspecified organism   2. Bronchospasm    Administered Solumedrol 80mg  IM.  Begin prednisone burst/taper (start tomorrow). Rx for Z-pak for atypical coverage. Prescription written for Benzonatate [facility]) to take at bedtime for night-time cough.  Take plain guaifenesin (1200mg  extended release tabs such as Mucinex) twice daily, with plenty of water, for cough and congestion.  May add Pseudoephedrine (30mg , one or two every 4 to 6 hours) for sinus congestion.  Get adequate rest.   May use Afrin nasal spray (or generic oxymetazoline) twice daily for about 5 days and then discontinue.  Also recommend using saline nasal spray several times daily and saline nasal irrigation (AYR is a common brand).  Use Flonase nasal spray each morning after using Afrin nasal spray and saline nasal irrigation. Try warm salt water gargles for sore throat.  Stop all antihistamines for now, and other non-prescription cough/cold preparations.  Follow-up with family doctor if not improving about 7 to 10 days.     Kandra Nicolas, MD 07/30/15 [phone]

## 2015-07-30 NOTE — ED Notes (Signed)
10 days ago started with sinus pain pressure, headache, which has resolved. Persistant  Dry cough

## 2015-08-14 ENCOUNTER — Telehealth: Payer: Self-pay | Admitting: Family Medicine

## 2015-08-14 MED ORDER — VITAMIN D (ERGOCALCIFEROL) 1.25 MG (50000 UNIT) PO CAPS: ORAL_CAPSULE | ORAL | Status: DC

## 2015-08-14 NOTE — Telephone Encounter (Signed)
Last Vitamin D done on 12/14/12. Apt scheduled for 08/21/15. Please advise    KP

## 2015-08-14 NOTE — Telephone Encounter (Signed)
Refill x1  3 refills ---- keep app

## 2015-08-14 NOTE — Telephone Encounter (Signed)
Relation to [id number] Call back [facility] [address] [place] [phone] (Phone) [phone] (Fax)         Reason for call:  Patient requesting a refill Vitamin D, Ergocalciferol, (DRISDOL) 50000 UNITS CAPS capsule

## 2015-08-14 NOTE — Telephone Encounter (Signed)
Rx faxed.    KP 

## 2015-08-21 ENCOUNTER — Ambulatory Visit (INDEPENDENT_AMBULATORY_CARE_PROVIDER_SITE_OTHER): Payer: 59 | Admitting: Family Medicine

## 2015-08-21 ENCOUNTER — Encounter: Payer: Self-pay | Admitting: Family Medicine

## 2015-08-21 VITALS — BP 128/82 | HR 86 | Temp 98.6°F | Ht 70.0 in | Wt 224.0 lb

## 2015-08-21 DIAGNOSIS — Z1159 Encounter for screening for other viral diseases: Secondary | ICD-10-CM | POA: Diagnosis not present

## 2015-08-21 DIAGNOSIS — T753XXA Motion sickness, initial encounter: Secondary | ICD-10-CM | POA: Diagnosis not present

## 2015-08-21 DIAGNOSIS — Z0000 Encounter for general adult medical examination without abnormal findings: Secondary | ICD-10-CM

## 2015-08-21 MED ORDER — SCOPOLAMINE 1 MG/3DAYS TD PT72: 1.0000 | MEDICATED_PATCH | TRANSDERMAL | Status: DC

## 2015-08-21 NOTE — Patient Instructions (Signed)
Preventive Care for Adults, Female A healthy lifestyle and preventive care can promote health and wellness. Preventive health guidelines for women include the following key practices.  A routine yearly physical is a good way to check with your health care provider about your health and preventive screening. It is a chance to share any concerns and updates on your health and to receive a thorough exam.  Visit your dentist for a routine exam and preventive care every 6 months. Brush your teeth twice a day and floss once a day. Good oral hygiene prevents tooth decay and gum disease.  The frequency of eye exams is based on your age, health, family medical history, use of contact lenses, and other factors. Follow your health care provider's recommendations for frequency of eye exams.  Eat a healthy diet. Foods like vegetables, fruits, whole grains, low-fat dairy products, and lean protein foods contain the nutrients you need without too many calories. Decrease your intake of foods high in solid fats, added sugars, and salt. Eat the right amount of calories for you.Get information about a proper diet from your health care provider, if necessary.  Regular physical exercise is one of the most important things you can do for your health. Most adults should get at least 150 minutes of moderate-intensity exercise (any activity that increases your heart rate and causes you to sweat) each week. In addition, most adults need muscle-strengthening exercises on 2 or more days a week.  Maintain a healthy weight. The body mass index (BMI) is a screening tool to identify possible weight problems. It provides an estimate of body fat based on height and weight. Your health care provider can find your BMI and can help you achieve or maintain a healthy weight.For adults 20 years and older:  A BMI below 18.5 is considered underweight.  A BMI of 18.5 to 24.9 is normal.  A BMI of 25 to 29.9 is considered overweight.  A  BMI of 30 and above is considered obese.  Maintain normal blood lipids and cholesterol levels by exercising and minimizing your intake of saturated fat. Eat a balanced diet with plenty of fruit and vegetables. Blood tests for lipids and cholesterol should begin at age 45 and be repeated every 5 years. If your lipid or cholesterol levels are high, you are over 50, or you are at high risk for heart disease, you may need your cholesterol levels checked more frequently.Ongoing high lipid and cholesterol levels should be treated with medicines if diet and exercise are not working.  If you smoke, find out from your health care provider how to quit. If you do not use tobacco, do not start.  Lung cancer screening is recommended for adults aged 45-80 years who are at high risk for developing lung cancer because of a history of smoking. A yearly low-dose CT scan of the lungs is recommended for people who have at least a 30-pack-year history of smoking and are a current smoker or have quit within the past 15 years. A pack year of smoking is smoking an average of 1 pack of cigarettes a day for 1 year (for example: 1 pack a day for 30 years or 2 packs a day for 15 years). Yearly screening should continue until the smoker has stopped smoking for at least 15 years. Yearly screening should be stopped for people who develop a health problem that would prevent them from having lung cancer treatment.  If you are pregnant, do not drink alcohol. If you are  breastfeeding, be very cautious about drinking alcohol. If you are not pregnant and choose to drink alcohol, do not have more than 1 drink per day. One drink is considered to be 12 ounces (355 mL) of beer, 5 ounces (148 mL) of wine, or 1.5 ounces (44 mL) of liquor.  Avoid use of street drugs. Do not share needles with anyone. Ask for help if you need support or instructions about stopping the use of drugs.  High blood pressure causes heart disease and increases the risk  of stroke. Your blood pressure should be checked at least every 1 to 2 years. Ongoing high blood pressure should be treated with medicines if weight loss and exercise do not work.  If you are 55-79 years old, ask your health care provider if you should take aspirin to prevent strokes.  Diabetes screening is done by taking a blood sample to check your blood glucose level after you have not eaten for a certain period of time (fasting). If you are not overweight and you do not have risk factors for diabetes, you should be screened once every 3 years starting at age 45. If you are overweight or obese and you are 40-70 years of age, you should be screened for diabetes every year as part of your cardiovascular risk assessment.  Breast cancer screening is essential preventive care for women. You should practice "breast self-awareness." This means understanding the normal appearance and feel of your breasts and may include breast self-examination. Any changes detected, no matter how small, should be reported to a health care provider. Women in their 20s and 30s should have a clinical breast exam (CBE) by a health care provider as part of a regular health exam every 1 to 3 years. After age 40, women should have a CBE every year. Starting at age 40, women should consider having a mammogram (breast X-ray test) every year. Women who have a family history of breast cancer should talk to their health care provider about genetic screening. Women at a high risk of breast cancer should talk to their health care providers about having an MRI and a mammogram every year.  Breast cancer gene (BRCA)-related cancer risk assessment is recommended for women who have family members with BRCA-related cancers. BRCA-related cancers include breast, ovarian, tubal, and peritoneal cancers. Having family members with these cancers may be associated with an increased risk for harmful changes (mutations) in the breast cancer genes BRCA1 and  BRCA2. Results of the assessment will determine the need for genetic counseling and BRCA1 and BRCA2 testing.  Your health care provider may recommend that you be screened regularly for cancer of the pelvic organs (ovaries, uterus, and vagina). This screening involves a pelvic examination, including checking for microscopic changes to the surface of your cervix (Pap test). You may be encouraged to have this screening done every 3 years, beginning at age 21.  For women ages 30-65, health care providers may recommend pelvic exams and Pap testing every 3 years, or they may recommend the Pap and pelvic exam, combined with testing for human papilloma virus (HPV), every 5 years. Some types of HPV increase your risk of cervical cancer. Testing for HPV may also be done on women of any age with unclear Pap test results.  Other health care providers may not recommend any screening for nonpregnant women who are considered low risk for pelvic cancer and who do not have symptoms. Ask your health care provider if a screening pelvic exam is right for   you.  If you have had past treatment for cervical cancer or a condition that could lead to cancer, you need Pap tests and screening for cancer for at least 20 years after your treatment. If Pap tests have been discontinued, your risk factors (such as having a new sexual partner) need to be reassessed to determine if screening should resume. Some women have medical problems that increase the chance of getting cervical cancer. In these cases, your health care provider may recommend more frequent screening and Pap tests.  Colorectal cancer can be detected and often prevented. Most routine colorectal cancer screening begins at the age of 50 years and continues through age 75 years. However, your health care provider may recommend screening at an earlier age if you have risk factors for colon cancer. On a yearly basis, your health care provider may provide home test kits to check  for hidden blood in the stool. Use of a small camera at the end of a tube, to directly examine the colon (sigmoidoscopy or colonoscopy), can detect the earliest forms of colorectal cancer. Talk to your health care provider about this at age 50, when routine screening begins. Direct exam of the colon should be repeated every 5-10 years through age 75 years, unless early forms of precancerous polyps or small growths are found.  People who are at an increased risk for hepatitis B should be screened for this virus. You are considered at high risk for hepatitis B if:  You were born in a country where hepatitis B occurs often. Talk with your health care provider about which countries are considered high risk.  Your parents were born in a high-risk country and you have not received a shot to protect against hepatitis B (hepatitis B vaccine).  You have HIV or AIDS.  You use needles to inject street drugs.  You live with, or have sex with, someone who has hepatitis B.  You get hemodialysis treatment.  You take certain medicines for conditions like cancer, organ transplantation, and autoimmune conditions.  Hepatitis C blood testing is recommended for all people born from 1945 through 1965 and any individual with known risks for hepatitis C.  Practice safe sex. Use condoms and avoid high-risk sexual practices to reduce the spread of sexually transmitted infections (STIs). STIs include gonorrhea, chlamydia, syphilis, trichomonas, herpes, HPV, and human immunodeficiency virus (HIV). Herpes, HIV, and HPV are viral illnesses that have no cure. They can result in disability, cancer, and death.  You should be screened for sexually transmitted illnesses (STIs) including gonorrhea and chlamydia if:  You are sexually active and are younger than 24 years.  You are older than 24 years and your health care provider tells you that you are at risk for this type of infection.  Your sexual activity has changed  since you were last screened and you are at an increased risk for chlamydia or gonorrhea. Ask your health care provider if you are at risk.  If you are at risk of being infected with HIV, it is recommended that you take a prescription medicine daily to prevent HIV infection. This is called preexposure prophylaxis (PrEP). You are considered at risk if:  You are sexually active and do not regularly use condoms or know the HIV status of your partner(s).  You take drugs by injection.  You are sexually active with a partner who has HIV.  Talk with your health care provider about whether you are at high risk of being infected with HIV. If   you choose to begin PrEP, you should first be tested for HIV. You should then be tested every 3 months for as long as you are taking PrEP.  Osteoporosis is a disease in which the bones lose minerals and strength with aging. This can result in serious bone fractures or breaks. The risk of osteoporosis can be identified using a bone density scan. Women ages 67 years and over and women at risk for fractures or osteoporosis should discuss screening with their health care providers. Ask your health care provider whether you should take a calcium supplement or vitamin D to reduce the rate of osteoporosis.  Menopause can be associated with physical symptoms and risks. Hormone replacement therapy is available to decrease symptoms and risks. You should talk to your health care provider about whether hormone replacement therapy is right for you.  Use sunscreen. Apply sunscreen liberally and repeatedly throughout the day. You should seek shade when your shadow is shorter than you. Protect yourself by wearing long sleeves, pants, a wide-brimmed hat, and sunglasses year round, whenever you are outdoors.  Once a month, do a whole body skin exam, using a mirror to look at the skin on your back. Tell your health care provider of new moles, moles that have irregular borders, moles that  are larger than a pencil eraser, or moles that have changed in shape or color.  Stay current with required vaccines (immunizations).  Influenza vaccine. All adults should be immunized every year.  Tetanus, diphtheria, and acellular pertussis (Td, Tdap) vaccine. Pregnant women should receive 1 dose of Tdap vaccine during each pregnancy. The dose should be obtained regardless of the length of time since the last dose. Immunization is preferred during the 27th-36th week of gestation. An adult who has not previously received Tdap or who does not know her vaccine status should receive 1 dose of Tdap. This initial dose should be followed by tetanus and diphtheria toxoids (Td) booster doses every 10 years. Adults with an unknown or incomplete history of completing a 3-dose immunization series with Td-containing vaccines should begin or complete a primary immunization series including a Tdap dose. Adults should receive a Td booster every 10 years.  Varicella vaccine. An adult without evidence of immunity to varicella should receive 2 doses or a second dose if she has previously received 1 dose. Pregnant females who do not have evidence of immunity should receive the first dose after pregnancy. This first dose should be obtained before leaving the health care facility. The second dose should be obtained 4-8 weeks after the first dose.  Human papillomavirus (HPV) vaccine. Females aged 13-26 years who have not received the vaccine previously should obtain the 3-dose series. The vaccine is not recommended for use in pregnant females. However, pregnancy testing is not needed before receiving a dose. If a female is found to be pregnant after receiving a dose, no treatment is needed. In that case, the remaining doses should be delayed until after the pregnancy. Immunization is recommended for any person with an immunocompromised condition through the age of 61 years if she did not get any or all doses earlier. During the  3-dose series, the second dose should be obtained 4-8 weeks after the first dose. The third dose should be obtained 24 weeks after the first dose and 16 weeks after the second dose.  Zoster vaccine. One dose is recommended for adults aged 30 years or older unless certain conditions are present.  Measles, mumps, and rubella (MMR) vaccine. Adults born  before 1957 generally are considered immune to measles and mumps. Adults born in 1957 or later should have 1 or more doses of MMR vaccine unless there is a contraindication to the vaccine or there is laboratory evidence of immunity to each of the three diseases. A routine second dose of MMR vaccine should be obtained at least 28 days after the first dose for students attending postsecondary schools, health care workers, or international travelers. People who received inactivated measles vaccine or an unknown type of measles vaccine during 1963-1967 should receive 2 doses of MMR vaccine. People who received inactivated mumps vaccine or an unknown type of mumps vaccine before 1979 and are at high risk for mumps infection should consider immunization with 2 doses of MMR vaccine. For females of childbearing age, rubella immunity should be determined. If there is no evidence of immunity, females who are not pregnant should be vaccinated. If there is no evidence of immunity, females who are pregnant should delay immunization until after pregnancy. Unvaccinated health care workers born before 1957 who lack laboratory evidence of measles, mumps, or rubella immunity or laboratory confirmation of disease should consider measles and mumps immunization with 2 doses of MMR vaccine or rubella immunization with 1 dose of MMR vaccine.  Pneumococcal 13-valent conjugate (PCV13) vaccine. When indicated, a person who is uncertain of his immunization history and has no record of immunization should receive the PCV13 vaccine. All adults 65 years of age and older should receive this  vaccine. An adult aged 19 years or older who has certain medical conditions and has not been previously immunized should receive 1 dose of PCV13 vaccine. This PCV13 should be followed with a dose of pneumococcal polysaccharide (PPSV23) vaccine. Adults who are at high risk for pneumococcal disease should obtain the PPSV23 vaccine at least 8 weeks after the dose of PCV13 vaccine. Adults older than 65 years of age who have normal immune system function should obtain the PPSV23 vaccine dose at least 1 year after the dose of PCV13 vaccine.  Pneumococcal polysaccharide (PPSV23) vaccine. When PCV13 is also indicated, PCV13 should be obtained first. All adults aged 65 years and older should be immunized. An adult younger than age 65 years who has certain medical conditions should be immunized. Any person who resides in a nursing home or long-term care facility should be immunized. An adult smoker should be immunized. People with an immunocompromised condition and certain other conditions should receive both PCV13 and PPSV23 vaccines. People with human immunodeficiency virus (HIV) infection should be immunized as soon as possible after diagnosis. Immunization during chemotherapy or radiation therapy should be avoided. Routine use of PPSV23 vaccine is not recommended for American Indians, [place] Natives, or people younger than 65 years unless there are medical conditions that require PPSV23 vaccine. When indicated, people who have unknown immunization and have no record of immunization should receive PPSV23 vaccine. One-time revaccination 5 years after the first dose of PPSV23 is recommended for people aged 19-64 years who have chronic kidney failure, nephrotic syndrome, asplenia, or immunocompromised conditions. People who received 1-2 doses of PPSV23 before age 65 years should receive another dose of PPSV23 vaccine at age 65 years or later if at least 5 years have passed since the previous dose. Doses of PPSV23 are not  needed for people immunized with PPSV23 at or after age 65 years.  Meningococcal vaccine. Adults with asplenia or persistent complement component deficiencies should receive 2 doses of quadrivalent meningococcal conjugate (MenACWY-D) vaccine. The doses should be obtained   at least 2 months apart. Microbiologists working with certain meningococcal bacteria, [place] recruits, people at risk during an outbreak, and people who travel to or live in countries with a high rate of meningitis should be immunized. A first-year college student up through age 34 years who is living in a residence hall should receive a dose if she did not receive a dose on or after her 16th birthday. Adults who have certain high-risk conditions should receive one or more doses of vaccine.  Hepatitis A vaccine. Adults who wish to be protected from this disease, have certain high-risk conditions, work with hepatitis A-infected animals, work in hepatitis A research labs, or travel to or work in countries with a high rate of hepatitis A should be immunized. Adults who were previously unvaccinated and who anticipate close contact with an international adoptee during the first 60 days after arrival in the [place] from a country with a high rate of hepatitis A should be immunized.  Hepatitis B vaccine. Adults who wish to be protected from this disease, have certain high-risk conditions, may be exposed to blood or other infectious body fluids, are household contacts or sex partners of hepatitis B positive people, are clients or workers in certain care facilities, or travel to or work in countries with a high rate of hepatitis B should be immunized.  Haemophilus influenzae type b (Hib) vaccine. A previously unvaccinated person with asplenia or sickle cell disease or having a scheduled splenectomy should receive 1 dose of Hib vaccine. Regardless of previous immunization, a recipient of a hematopoietic stem cell transplant should receive a  3-dose series 6-12 months after her successful transplant. Hib vaccine is not recommended for adults with HIV infection. Preventive Services / Frequency Ages 35 to 4 years  Blood pressure check.** / Every 3-5 years.  Lipid and cholesterol check.** / Every 5 years beginning at age 60.  Clinical breast exam.** / Every 3 years for women in their 71s and 10s.  BRCA-related cancer risk assessment.** / For women who have family members with a BRCA-related cancer (breast, ovarian, tubal, or peritoneal cancers).  Pap test.** / Every 2 years from ages 76 through 26. Every 3 years starting at age 61 through age 76 or 93 with a history of 3 consecutive normal Pap tests.  HPV screening.** / Every 3 years from ages 37 through ages 60 to 51 with a history of 3 consecutive normal Pap tests.  Hepatitis C blood test.** / For any individual with known risks for hepatitis C.  Skin self-exam. / Monthly.  Influenza vaccine. / Every year.  Tetanus, diphtheria, and acellular pertussis (Tdap, Td) vaccine.** / Consult your health care provider. Pregnant women should receive 1 dose of Tdap vaccine during each pregnancy. 1 dose of Td every 10 years.  Varicella vaccine.** / Consult your health care provider. Pregnant females who do not have evidence of immunity should receive the first dose after pregnancy.  HPV vaccine. / 3 doses over 6 months, if 93 and younger. The vaccine is not recommended for use in pregnant females. However, pregnancy testing is not needed before receiving a dose.  Measles, mumps, rubella (MMR) vaccine.** / You need at least 1 dose of MMR if you were born in 1957 or later. You may also need a 2nd dose. For females of childbearing age, rubella immunity should be determined. If there is no evidence of immunity, females who are not pregnant should be vaccinated. If there is no evidence of immunity, females who are  pregnant should delay immunization until after pregnancy.  Pneumococcal  13-valent conjugate (PCV13) vaccine.** / Consult your health care provider.  Pneumococcal polysaccharide (PPSV23) vaccine.** / 1 to 2 doses if you smoke cigarettes or if you have certain conditions.  Meningococcal vaccine.** / 1 dose if you are age 68 to 8 years and a Market researcher living in a residence hall, or have one of several medical conditions, you need to get vaccinated against meningococcal disease. You may also need additional booster doses.  Hepatitis A vaccine.** / Consult your health care provider.  Hepatitis B vaccine.** / Consult your health care provider.  Haemophilus influenzae type b (Hib) vaccine.** / Consult your health care provider. Ages 7 to 53 years  Blood pressure check.** / Every year.  Lipid and cholesterol check.** / Every 5 years beginning at age 25 years.  Lung cancer screening. / Every year if you are aged 11-80 years and have a 30-pack-year history of smoking and currently smoke or have quit within the past 15 years. Yearly screening is stopped once you have quit smoking for at least 15 years or develop a health problem that would prevent you from having lung cancer treatment.  Clinical breast exam.** / Every year after age 48 years.  BRCA-related cancer risk assessment.** / For women who have family members with a BRCA-related cancer (breast, ovarian, tubal, or peritoneal cancers).  Mammogram.** / Every year beginning at age 41 years and continuing for as long as you are in good health. Consult with your health care provider.  Pap test.** / Every 3 years starting at age 65 years through age 37 or 70 years with a history of 3 consecutive normal Pap tests.  HPV screening.** / Every 3 years from ages 72 years through ages 60 to 40 years with a history of 3 consecutive normal Pap tests.  Fecal occult blood test (FOBT) of stool. / Every year beginning at age 21 years and continuing until age 5 years. You may not need to do this test if you get  a colonoscopy every 10 years.  Flexible sigmoidoscopy or colonoscopy.** / Every 5 years for a flexible sigmoidoscopy or every 10 years for a colonoscopy beginning at age 35 years and continuing until age 48 years.  Hepatitis C blood test.** / For all people born from [birth year] through 1965 and any individual with known risks for hepatitis C.  Skin self-exam. / Monthly.  Influenza vaccine. / Every year.  Tetanus, diphtheria, and acellular pertussis (Tdap/Td) vaccine.** / Consult your health care provider. Pregnant women should receive 1 dose of Tdap vaccine during each pregnancy. 1 dose of Td every 10 years.  Varicella vaccine.** / Consult your health care provider. Pregnant females who do not have evidence of immunity should receive the first dose after pregnancy.  Zoster vaccine.** / 1 dose for adults aged 30 years or older.  Measles, mumps, rubella (MMR) vaccine.** / You need at least 1 dose of MMR if you were born in 1957 or later. You may also need a second dose. For females of childbearing age, rubella immunity should be determined. If there is no evidence of immunity, females who are not pregnant should be vaccinated. If there is no evidence of immunity, females who are pregnant should delay immunization until after pregnancy.  Pneumococcal 13-valent conjugate (PCV13) vaccine.** / Consult your health care provider.  Pneumococcal polysaccharide (PPSV23) vaccine.** / 1 to 2 doses if you smoke cigarettes or if you have certain conditions.  Meningococcal vaccine.** /  Consult your health care provider.  Hepatitis A vaccine.** / Consult your health care provider.  Hepatitis B vaccine.** / Consult your health care provider.  Haemophilus influenzae type b (Hib) vaccine.** / Consult your health care provider. Ages 64 years and over  Blood pressure check.** / Every year.  Lipid and cholesterol check.** / Every 5 years beginning at age 23 years.  Lung cancer screening. / Every year if you  are aged 16-80 years and have a 30-pack-year history of smoking and currently smoke or have quit within the past 15 years. Yearly screening is stopped once you have quit smoking for at least 15 years or develop a health problem that would prevent you from having lung cancer treatment.  Clinical breast exam.** / Every year after age 74 years.  BRCA-related cancer risk assessment.** / For women who have family members with a BRCA-related cancer (breast, ovarian, tubal, or peritoneal cancers).  Mammogram.** / Every year beginning at age 44 years and continuing for as long as you are in good health. Consult with your health care provider.  Pap test.** / Every 3 years starting at age 58 years through age 22 or 39 years with 3 consecutive normal Pap tests. Testing can be stopped between 65 and 70 years with 3 consecutive normal Pap tests and no abnormal Pap or HPV tests in the past 10 years.  HPV screening.** / Every 3 years from ages 64 years through ages 70 or 61 years with a history of 3 consecutive normal Pap tests. Testing can be stopped between 65 and 70 years with 3 consecutive normal Pap tests and no abnormal Pap or HPV tests in the past 10 years.  Fecal occult blood test (FOBT) of stool. / Every year beginning at age 40 years and continuing until age 27 years. You may not need to do this test if you get a colonoscopy every 10 years.  Flexible sigmoidoscopy or colonoscopy.** / Every 5 years for a flexible sigmoidoscopy or every 10 years for a colonoscopy beginning at age 7 years and continuing until age 32 years.  Hepatitis C blood test.** / For all people born from [birth year] through 1964 and any individual with known risks for hepatitis C.  Osteoporosis screening.** / A one-time screening for women ages 30 years and over and women at risk for fractures or osteoporosis.  Skin self-exam. / Monthly.  Influenza vaccine. / Every year.  Tetanus, diphtheria, and acellular pertussis (Tdap/Td)  vaccine.** / 1 dose of Td every 10 years.  Varicella vaccine.** / Consult your health care provider.  Zoster vaccine.** / 1 dose for adults aged 35 years or older.  Pneumococcal 13-valent conjugate (PCV13) vaccine.** / Consult your health care provider.  Pneumococcal polysaccharide (PPSV23) vaccine.** / 1 dose for all adults aged 46 years and older.  Meningococcal vaccine.** / Consult your health care provider.  Hepatitis A vaccine.** / Consult your health care provider.  Hepatitis B vaccine.** / Consult your health care provider.  Haemophilus influenzae type b (Hib) vaccine.** / Consult your health care provider. ** Family history and personal history of risk and conditions may change your health care provider's recommendations.   This information is not intended to replace advice given to you by your health care provider. Make sure you discuss any questions you have with your health care provider.   Document Released: 04/26/2001 Document Revised: 03/21/2014 Document Reviewed: 07/26/2010 Elsevier Interactive Patient Education [employer].

## 2015-08-21 NOTE — Progress Notes (Signed)
Subjective:     Barbara Thomas is a 65 y.o. female and is here for a comprehensive physical exam. The patient reports no problems.  Social History   Social History  . Marital Status: Married    Spouse Name: N/A  . Number of Children: N/A  . Years of Education: N/A   Occupational History  . housewife    Social History Main Topics  . Smoking status: Never Smoker   . Smokeless tobacco: Never Used  . Alcohol Use: No  . Drug Use: No  . Sexual Activity: Not on file   Other Topics Concern  . Not on file   Social History Narrative   Exercise--- walking--- qd short distance   Health Maintenance  Topic Date Due  . Hepatitis C Screening  11/17/1950  . HIV Screening  11/08/1966  . INFLUENZA VACCINE  10/13/2015  . COLONOSCOPY  05/16/2016  . MAMMOGRAM  01/29/2017  . TETANUS/TDAP  02/06/2023  . ZOSTAVAX  Completed    The following portions of the patient's history were reviewed and updated as appropriate:  She  has a past medical history of Migraines; History of DVT of lower extremity; History of pulmonary embolus (PE); GERD (gastroesophageal reflux disease); H/O hiatal hernia; Sigmoid diverticulosis; Left ureteral calculus; Arthritis; PONV (postoperative nausea and vomiting); History of basal cell carcinoma excision; Wears glasses; and OSA on CPAP. She  does not have any pertinent problems on file. She  has past surgical history that includes Hip pinning (Left, 1985); BENIGN RIGHT BREAST BX (12-06-2010); Insertion of vena cava filter (12/ 2011); ORIF LEFT ANKLE FX (11/ 2011); Partial hip arthroplasty (Left, 1987); REVISION HIP HEMIARTHROPLASTY  (Left, 2004); Cystoscopy with retrograde pyelogram, ureteroscopy and stent placement (Left, 11/25/2013); Holmium laser application (Left, 123XX123); and Vaginal hysterectomy (2005). Her family history includes Cancer in her father; Dementia in her mother; Diabetes in her brother; Hypertension in her father and mother; Stroke in her paternal  grandmother; Sudden death in her maternal grandmother; Transient ischemic attack in her mother. She  reports that she has never smoked. She has never used smokeless tobacco. She reports that she does not drink alcohol or use illicit drugs. She has a current medication list which includes the following prescription(s): acetaminophen, aspirin, calcium carbonate, cetirizine, co-enzyme q-10, guaifenesin, melatonin, multivitamin, promethazine-codeine, simvastatin, vitamin d (ergocalciferol), and scopolamine. Current Outpatient Prescriptions on File Prior to Visit  Medication Sig Dispense Refill  . acetaminophen (TYLENOL) 500 MG tablet Take 1,000 mg by mouth every 6 (six) hours as needed for headache.    Marland Kitchen aspirin 81 MG chewable tablet Chew 162 mg by mouth daily.    . calcium carbonate (OS-CAL) 600 MG TABS Take 600 mg by mouth 2 (two) times daily with a meal.      . cetirizine (ZYRTEC) 10 MG tablet Take 10 mg by mouth daily.    Marland Kitchen co-enzyme Q-10 30 MG capsule Take 30 mg by mouth daily.    Marland Kitchen guaiFENesin (MUCINEX) 600 MG 12 hr tablet Take 600 mg by mouth 2 (two) times daily as needed for cough.    . Multiple Vitamin (MULTIVITAMIN) tablet Take 1 tablet by mouth daily.      . simvastatin (ZOCOR) 20 MG tablet Take 1 tablet by mouth at  bedtime 90 tablet 1  . Vitamin D, Ergocalciferol, (DRISDOL) 50000 units CAPS capsule TAKE 1 CAPSULE (50,000 UNITS TOTAL) BY MOUTH EVERY 7 (SEVEN) DAYS. SATURDAY 4 capsule 3   No current facility-administered medications on file prior to visit.  She is allergic to penicillins and morphine and related..  Review of Systems Review of Systems  Constitutional: Negative for activity change, appetite change and fatigue.  HENT: Negative for hearing loss, congestion, tinnitus and ear discharge.  dentist q39m Eyes: Negative for visual disturbance (see optho q1y -- vision corrected to 20/20 with glasses).  Respiratory: Negative for cough, chest tightness and shortness of breath.    Cardiovascular: Negative for chest pain, palpitations and leg swelling.  Gastrointestinal: Negative for abdominal pain, diarrhea, constipation and abdominal distention.  Genitourinary: Negative for urgency, frequency, decreased urine volume and difficulty urinating.  Musculoskeletal: Negative for back pain, arthralgias and gait problem.  Skin: Negative for color change, pallor and rash.  Neurological: Negative for dizziness, light-headedness, numbness and headaches.  Hematological: Negative for adenopathy. Does not bruise/bleed easily.  Psychiatric/Behavioral: Negative for suicidal ideas, confusion, sleep disturbance, self-injury, dysphoric mood, decreased concentration and agitation.       Objective:    BP 128/82 mmHg  Pulse 86  Temp(Src) 98.6 F (37 C) (Oral)  Ht 5\' 10"  (1.778 m)  Wt 224 lb (101.606 kg)  BMI 32.14 kg/m2  SpO2 98% General appearance: alert, cooperative, appears stated age and no distress Head: Normocephalic, without obvious abnormality, atraumatic Eyes: negative findings: lids and lashes normal, conjunctivae and sclerae normal and pupils equal, round, reactive to light and accomodation Ears: normal TM's and external ear canals both ears Nose: Nares normal. Septum midline. Mucosa normal. No drainage or sinus tenderness. Throat: lips, mucosa, and tongue normal; teeth and gums normal Neck: no adenopathy, no carotid bruit, no JVD, supple, symmetrical, trachea midline and thyroid not enlarged, symmetric, no tenderness/mass/nodules Back: symmetric, no curvature. ROM normal. No CVA tenderness. Lungs: clear to auscultation bilaterally Breasts: normal appearance, no masses or tenderness Heart: S1, S2 normal Abdomen: soft, non-tender; bowel sounds normal; no masses,  no organomegaly Pelvic: not indicated; post-menopausal, no abnormal Pap smears in past Extremities: extremities normal, atraumatic, no cyanosis or edema Pulses: 2+ and symmetric Skin: Skin color, texture,  turgor normal. No rashes or lesions Lymph nodes: Cervical, supraclavicular, and axillary nodes normal. Neurologic: Alert and oriented X 3, normal strength and tone. Normal symmetric reflexes. Normal coordination and gait     Assessment:.ros    Healthy female exam.      Plan:    ghm utd Check labs See After Visit Summary for Counseling Recommendations   1. Motion sickness, initial encounter   - scopolamine (TRANSDERM-SCOP) 1 MG/3DAYS; Place 1 patch (1.5 mg total) onto the skin every 3 (three) days.  Dispense: 10 patch; Refill: 0

## 2015-08-21 NOTE — Progress Notes (Signed)
Pre visit review using our clinic review tool, if applicable. No additional management support is needed unless otherwise documented below in the visit note. 

## 2015-08-24 ENCOUNTER — Telehealth: Payer: Self-pay | Admitting: *Deleted

## 2015-08-24 NOTE — Telephone Encounter (Signed)
Colonoscopy report received via fax from Dr. Lavone Neri. Forwarded to Dr. Carollee Herter for review. JG//CMA

## 2015-08-24 NOTE — Telephone Encounter (Signed)
Received surgical pathology report for colon biopsy (periappendiceal fold) from [facility] in [place], [place]. Forwarded to Dr. Carollee Herter for review. JG//CMA

## 2015-08-25 ENCOUNTER — Other Ambulatory Visit (INDEPENDENT_AMBULATORY_CARE_PROVIDER_SITE_OTHER): Payer: 59

## 2015-08-25 ENCOUNTER — Encounter: Payer: Self-pay | Admitting: Family Medicine

## 2015-08-25 DIAGNOSIS — Z0000 Encounter for general adult medical examination without abnormal findings: Secondary | ICD-10-CM | POA: Diagnosis not present

## 2015-08-25 LAB — CBC WITH DIFFERENTIAL/PLATELET
BASOS PCT: 1 % (ref 0.0–3.0)
Basophils Absolute: 0.1 10*3/uL (ref 0.0–0.1)
EOS PCT: 4.2 % (ref 0.0–5.0)
Eosinophils Absolute: 0.2 10*3/uL (ref 0.0–0.7)
HCT: 41.2 % (ref 36.0–46.0)
Hemoglobin: 13.6 g/dL (ref 12.0–15.0)
LYMPHS ABS: 1.6 10*3/uL (ref 0.7–4.0)
Lymphocytes Relative: 31.1 % (ref 12.0–46.0)
MCHC: 33.1 g/dL (ref 30.0–36.0)
MCV: 84.5 fl (ref 78.0–100.0)
MONO ABS: 0.4 10*3/uL (ref 0.1–1.0)
Monocytes Relative: 7.3 % (ref 3.0–12.0)
NEUTROS ABS: 3 10*3/uL (ref 1.4–7.7)
NEUTROS PCT: 56.4 % (ref 43.0–77.0)
PLATELETS: 238 10*3/uL (ref 150.0–400.0)
RBC: 4.88 Mil/uL (ref 3.87–5.11)
RDW: 14.3 % (ref 11.5–15.5)
WBC: 5.3 10*3/uL (ref 4.0–10.5)

## 2015-08-25 LAB — COMPREHENSIVE METABOLIC PANEL
ALT: 28 U/L (ref 0–35)
AST: 21 U/L (ref 0–37)
Albumin: 4.2 g/dL (ref 3.5–5.2)
Alkaline Phosphatase: 73 U/L (ref 39–117)
BILIRUBIN TOTAL: 0.5 mg/dL (ref 0.2–1.2)
BUN: 23 mg/dL (ref 6–23)
CHLORIDE: 103 meq/L (ref 96–112)
CO2: 26 meq/L (ref 19–32)
Calcium: 9.6 mg/dL (ref 8.4–10.5)
Creatinine, Ser: 1.08 mg/dL (ref 0.40–1.20)
GFR: 54.32 mL/min — AB (ref >60.00)
GLUCOSE: 120 mg/dL — AB (ref 70–99)
Potassium: 4.1 mEq/L (ref 3.5–5.1)
Sodium: 138 mEq/L (ref 135–145)
Total Protein: 7 g/dL (ref 6.0–8.3)

## 2015-08-25 LAB — POCT URINALYSIS DIPSTICK
Bilirubin, UA: NEGATIVE
Blood, UA: NEGATIVE
Glucose, UA: NEGATIVE
Ketones, UA: NEGATIVE
LEUKOCYTES UA: NEGATIVE
Nitrite, UA: NEGATIVE
PH UA: 6
PROTEIN UA: NEGATIVE
Spec Grav, UA: >=1.03
UROBILINOGEN UA: 0.2

## 2015-08-25 LAB — LIPID PANEL
CHOLESTEROL: 167 mg/dL (ref 0–200)
HDL: 55 mg/dL (ref >39.00)
LDL Cholesterol: 87 mg/dL (ref 0–99)
NonHDL: 112.46
Total CHOL/HDL Ratio: 3
Triglycerides: 129 mg/dL (ref 0.0–149.0)
VLDL: 25.8 mg/dL (ref 0.0–40.0)

## 2015-08-25 LAB — TSH: TSH: 1.98 u[IU]/mL (ref 0.35–4.50)

## 2015-09-10 ENCOUNTER — Other Ambulatory Visit: Payer: Self-pay | Admitting: Family Medicine

## 2015-09-26 ENCOUNTER — Other Ambulatory Visit: Payer: Self-pay | Admitting: Family Medicine

## 2015-09-28 NOTE — Telephone Encounter (Signed)
Requesting Vit.D 50,000 units-Take 1 capsule by mouth every 7 days. Last refill:08/14/15:4,3 Last OV:08/21/15-CPE Last lab:12/14/12 Please advise.//AB/CMA

## 2015-12-07 ENCOUNTER — Other Ambulatory Visit: Payer: Self-pay | Admitting: Family Medicine

## 2015-12-07 NOTE — Telephone Encounter (Signed)
Last seen 08/21/15 and filled 08/14/15 #4 with 3 rf   Please advise    KP

## 2016-02-01 LAB — HM MAMMOGRAPHY

## 2016-02-16 ENCOUNTER — Ambulatory Visit (INDEPENDENT_AMBULATORY_CARE_PROVIDER_SITE_OTHER): Payer: 59 | Admitting: Family Medicine

## 2016-02-16 ENCOUNTER — Encounter: Payer: Self-pay | Admitting: Family Medicine

## 2016-02-16 VITALS — BP 98/70 | HR 83 | Temp 98.6°F | Resp 16 | Ht 72.0 in | Wt 224.4 lb

## 2016-02-16 DIAGNOSIS — R739 Hyperglycemia, unspecified: Secondary | ICD-10-CM | POA: Diagnosis not present

## 2016-02-16 DIAGNOSIS — E785 Hyperlipidemia, unspecified: Secondary | ICD-10-CM | POA: Diagnosis not present

## 2016-02-16 DIAGNOSIS — Z1159 Encounter for screening for other viral diseases: Secondary | ICD-10-CM

## 2016-02-16 DIAGNOSIS — E559 Vitamin D deficiency, unspecified: Secondary | ICD-10-CM

## 2016-02-16 MED ORDER — VITAMIN D (ERGOCALCIFEROL) 1.25 MG (50000 UNIT) PO CAPS: ORAL_CAPSULE | ORAL | 3 refills | Status: DC

## 2016-02-16 MED ORDER — SIMVASTATIN 20 MG PO TABS: 20.0000 mg | ORAL_TABLET | Freq: Every day | ORAL | 1 refills | Status: DC

## 2016-02-16 NOTE — Patient Instructions (Signed)

## 2016-02-16 NOTE — Progress Notes (Signed)
[facility] Impression: There is no mammographic evidence of malignancy.

## 2016-02-16 NOTE — Progress Notes (Signed)
Patient ID: Barbara Thomas, female    DOB: 11-04-1950  Age: 65 y.o. MRN: [id number]    Subjective:  Subjective  HPI Tascha Batte presents for f/u lipid , glucose and depression.    Review of Systems  Constitutional: Negative for activity change, appetite change, fatigue and unexpected weight change.  Respiratory: Negative for cough and shortness of breath.   Cardiovascular: Negative for chest pain and palpitations.  Psychiatric/Behavioral: Negative for behavioral problems and dysphoric mood. The patient is not nervous/anxious.     History Past Medical History:  Diagnosis Date  . Arthritis   . GERD (gastroesophageal reflux disease)   . H/O hiatal hernia   . History of basal cell carcinoma excision    NOSE  . History of DVT of lower extremity    11/ 2011  BILATERAL  POST FOOT SURGERY  . History of pulmonary embolus (PE)    12/ 2011   POST FOOT SURGERY  . Left ureteral calculus   . Migraines   . OSA on CPAP    STUDY DONE 2012  . PONV (postoperative nausea and vomiting)    severe  . Sigmoid diverticulosis   . Wears glasses     She has a past surgical history that includes Hip pinning (Left, 1985); BENIGN RIGHT BREAST BX (12-06-2010); Insertion of vena cava filter (12/ 2011); ORIF LEFT ANKLE FX (11/ 2011); Partial hip arthroplasty (Left, 1987); REVISION HIP HEMIARTHROPLASTY  (Left, 2004); Cystoscopy with retrograde pyelogram, ureteroscopy and stent placement (Left, 11/25/2013); Holmium laser application (Left, 123XX123); and Vaginal hysterectomy (2005).   Her family history includes Cancer in her father; Dementia in her mother; Diabetes in her brother; Hypertension in her father and mother; Stroke in her paternal grandmother; Sudden death in her maternal grandmother; Transient ischemic attack in her mother.She reports that she has never smoked. She has never used smokeless tobacco. She reports that she does not drink alcohol or use drugs.  Current Outpatient Prescriptions on File Prior  to Visit  Medication Sig Dispense Refill  . acetaminophen (TYLENOL) 500 MG tablet Take 1,000 mg by mouth every 6 (six) hours as needed for headache.    Marland Kitchen aspirin 81 MG chewable tablet Chew 162 mg by mouth daily.    . calcium carbonate (OS-CAL) 600 MG TABS Take 600 mg by mouth 2 (two) times daily with a meal.      . cetirizine (ZYRTEC) 10 MG tablet Take 10 mg by mouth daily.    Marland Kitchen co-enzyme Q-10 30 MG capsule Take 30 mg by mouth daily.    Marland Kitchen guaiFENesin (MUCINEX) 600 MG 12 hr tablet Take 600 mg by mouth 2 (two) times daily as needed for cough.    . Melatonin 5 MG TABS Take 5 mg by mouth daily.    . Multiple Vitamin (MULTIVITAMIN) tablet Take 1 tablet by mouth daily.      . promethazine-codeine (PHENERGAN WITH CODEINE) 6.25-10 MG/5ML syrup Take 5 mLs by mouth at bedtime.    Marland Kitchen scopolamine (TRANSDERM-SCOP) 1 MG/3DAYS Place 1 patch (1.5 mg total) onto the skin every 3 (three) days. (Patient not taking: Reported on 02/16/2016) 10 patch 0   No current facility-administered medications on file prior to visit.      Objective:  Objective  Physical Exam  Constitutional: She is oriented to person, place, and time. She appears well-developed and well-nourished.  HENT:  Head: Normocephalic and atraumatic.  Eyes: Conjunctivae and EOM are normal.  Neck: Normal range of motion. Neck supple. No JVD present. Carotid bruit  is not present. No thyromegaly present.  Cardiovascular: Normal rate, regular rhythm and normal heart sounds.   No murmur heard. Pulmonary/Chest: Effort normal and breath sounds normal. No respiratory distress. She has no wheezes. She has no rales. She exhibits no tenderness.  Musculoskeletal: She exhibits no edema.  Neurological: She is alert and oriented to person, place, and time.  Psychiatric: She has a normal mood and affect. Her behavior is normal.  Nursing note and vitals reviewed.  BP 98/70 (BP Location: Left Arm, Patient Position: Sitting, Cuff Size: Large)   Pulse 83   Temp  98.6 F (37 C) (Oral)   Resp 16   Ht 6' (1.829 m)   Wt 224 lb 6.4 oz (101.8 kg)   SpO2 98%   BMI 30.43 kg/m  Wt Readings from Last 3 Encounters:  02/16/16 224 lb 6.4 oz (101.8 kg)  08/21/15 224 lb (101.6 kg)  07/30/15 224 lb (101.6 kg)     Lab Results  Component Value Date   WBC 5.3 08/25/2015   HGB 13.6 08/25/2015   HCT 41.2 08/25/2015   PLT 238.0 08/25/2015   GLUCOSE 120 (H) 08/25/2015   CHOL 167 08/25/2015   TRIG 129.0 08/25/2015   HDL 55.00 08/25/2015   LDLDIRECT 199.0 02/05/2013   LDLCALC 87 08/25/2015   ALT 28 08/25/2015   AST 21 08/25/2015   NA 138 08/25/2015   K 4.1 08/25/2015   CL 103 08/25/2015   CREATININE 1.08 08/25/2015   BUN 23 08/25/2015   CO2 26 08/25/2015   TSH 1.98 08/25/2015   INR 0.95 11/21/2013   HGBA1C 6.4 04/17/2014    Dg Chest 2 View  Result Date: 07/30/2015 CLINICAL DATA:  65 year old female with cough and congestion for 10 days. Initial encounter. EXAM: CHEST  2 VIEW COMPARISON:  07/11/2014 and earlier. FINDINGS: Lung volumes are stable since 2013. Normal cardiac size and mediastinal contours. Visualized tracheal air column is within normal limits. No pneumothorax, pulmonary edema, pleural effusion or acute pulmonary opacity. No acute osseous abnormality identified. IVC filter re - demonstrated in the abdomen. IMPRESSION: No acute cardiopulmonary abnormality. Electronically Signed   By: Genevie Ann M.D.   On: 07/30/2015 13:10     Assessment & Plan:  Plan  I have changed Ms. Bridgers's simvastatin. I am also having her maintain her calcium carbonate, multivitamin, acetaminophen, guaiFENesin, aspirin, co-enzyme Q-10, cetirizine, Melatonin, promethazine-codeine, scopolamine, and Vitamin D (Ergocalciferol).  Meds ordered this encounter  Medications  . DISCONTD: Vitamin D, Ergocalciferol, (DRISDOL) 50000 units CAPS capsule    Sig: TAKE 1 CAPSULE BY MOUTH EVERY 7 DAYS ON SATURDAY    Dispense:  12 capsule    Refill:  3  . Vitamin D, Ergocalciferol,  (DRISDOL) 50000 units CAPS capsule    Sig: TAKE 1 CAPSULE BY MOUTH EVERY 7 DAYS ON SATURDAY    Dispense:  12 capsule    Refill:  3  . simvastatin (ZOCOR) 20 MG tablet    Sig: Take 1 tablet (20 mg total) by mouth at bedtime.    Dispense:  90 tablet    Refill:  1    Problem List Items Addressed This Visit    None    Visit Diagnoses    Hyperlipidemia LDL goal <100    -  Primary   Relevant Medications   simvastatin (ZOCOR) 20 MG tablet   Other Relevant Orders   Lipid panel   Comprehensive metabolic panel   Hyperglycemia       Relevant Orders  Hemoglobin A1c   Vitamin D deficiency       Relevant Medications   Vitamin D, Ergocalciferol, (DRISDOL) 50000 units CAPS capsule   Other Relevant Orders   Vitamin D (25 hydroxy)   Need for hepatitis C screening test       Relevant Orders   Hepatitis C antibody      Follow-up: Return in about 6 months (around 08/16/2016) for annual exam, fasting.  Ann Held, DO

## 2016-02-16 NOTE — Progress Notes (Signed)
Pre visit review using our clinic review tool, if applicable. No additional management support is needed unless otherwise documented below in the visit note. 

## 2016-02-17 LAB — LIPID PANEL
Cholesterol: 165 mg/dL (ref 0–200)
HDL: 58.8 mg/dL (ref >39.00)
LDL Cholesterol: 85 mg/dL (ref 0–99)
NONHDL: 106.67
Total CHOL/HDL Ratio: 3
Triglycerides: 107 mg/dL (ref 0.0–149.0)
VLDL: 21.4 mg/dL (ref 0.0–40.0)

## 2016-02-17 LAB — COMPREHENSIVE METABOLIC PANEL
ALK PHOS: 79 U/L (ref 39–117)
ALT: 32 U/L (ref 0–35)
AST: 25 U/L (ref 0–37)
Albumin: 4.2 g/dL (ref 3.5–5.2)
BILIRUBIN TOTAL: 0.5 mg/dL (ref 0.2–1.2)
BUN: 21 mg/dL (ref 6–23)
CO2: 26 mEq/L (ref 19–32)
CREATININE: 0.86 mg/dL (ref 0.40–1.20)
Calcium: 10 mg/dL (ref 8.4–10.5)
Chloride: 103 mEq/L (ref 96–112)
GFR: 70.55 mL/min (ref >60.00)
GLUCOSE: 104 mg/dL — AB (ref 70–99)
POTASSIUM: 4.6 meq/L (ref 3.5–5.1)
SODIUM: 138 meq/L (ref 135–145)
TOTAL PROTEIN: 7.1 g/dL (ref 6.0–8.3)

## 2016-02-17 LAB — VITAMIN D 25 HYDROXY (VIT D DEFICIENCY, FRACTURES): VITD: 70.2 ng/mL (ref 30.00–100.00)

## 2016-02-17 LAB — HEPATITIS C ANTIBODY: HCV Ab: NEGATIVE

## 2016-02-17 LAB — HEMOGLOBIN A1C: Hgb A1c MFr Bld: 6.4 % (ref 4.6–6.5)

## 2016-02-23 ENCOUNTER — Other Ambulatory Visit: Payer: Self-pay | Admitting: Family Medicine

## 2016-02-23 DIAGNOSIS — E785 Hyperlipidemia, unspecified: Secondary | ICD-10-CM

## 2016-06-11 ENCOUNTER — Emergency Department (INDEPENDENT_AMBULATORY_CARE_PROVIDER_SITE_OTHER)
Admission: EM | Admit: 2016-06-11 | Discharge: 2016-06-11 | Disposition: A | Discharge status: Home / Self Care | Payer: 59 | Source: Home / Self Care | Attending: Family Medicine | Admitting: Family Medicine

## 2016-06-11 ENCOUNTER — Encounter: Payer: Self-pay | Admitting: Emergency Medicine

## 2016-06-11 ENCOUNTER — Emergency Department (HOSPITAL_COMMUNITY)
Admission: EM | Admit: 2016-06-11 | Discharge: 2016-06-11 | Disposition: A | Discharge status: Home / Self Care | Payer: 59 | Attending: Emergency Medicine | Admitting: Emergency Medicine

## 2016-06-11 ENCOUNTER — Encounter (HOSPITAL_COMMUNITY): Payer: Self-pay

## 2016-06-11 DIAGNOSIS — N764 Abscess of vulva: Secondary | ICD-10-CM | POA: Insufficient documentation

## 2016-06-11 DIAGNOSIS — Z79899 Other long term (current) drug therapy: Secondary | ICD-10-CM | POA: Insufficient documentation

## 2016-06-11 DIAGNOSIS — L02214 Cutaneous abscess of groin: Secondary | ICD-10-CM

## 2016-06-11 DIAGNOSIS — Z7982 Long term (current) use of aspirin: Secondary | ICD-10-CM | POA: Insufficient documentation

## 2016-06-11 DIAGNOSIS — Z96642 Presence of left artificial hip joint: Secondary | ICD-10-CM | POA: Diagnosis not present

## 2016-06-11 LAB — POCT CBC W AUTO DIFF (K'VILLE URGENT CARE)

## 2016-06-11 MED ORDER — IBUPROFEN 400 MG PO TABS: 400.0000 mg | ORAL_TABLET | Freq: Four times a day (QID) | ORAL | Status: DC | PRN

## 2016-06-11 MED ORDER — SULFAMETHOXAZOLE-TRIMETHOPRIM 800-160 MG PO TABS
1.0000 | ORAL_TABLET | Freq: Once | ORAL | Status: AC
  Administered 2016-06-11: 1 via ORAL
  Filled 2016-06-11: qty 1

## 2016-06-11 MED ORDER — LIDOCAINE HCL (PF) 1 % IJ SOLN
30.0000 mL | Freq: Once | INTRAMUSCULAR | Status: AC
Start: 2016-06-11 — End: 2016-06-11
  Administered 2016-06-11: 30 mL
  Filled 2016-06-11: qty 30

## 2016-06-11 MED ORDER — SULFAMETHOXAZOLE-TRIMETHOPRIM 800-160 MG PO TABS: 1.0000 | ORAL_TABLET | Freq: Two times a day (BID) | ORAL | 0 refills | Status: AC

## 2016-06-11 NOTE — ED Provider Notes (Signed)
Barbara Thomas CARE    CSN: [id number] Arrival date & time: 06/11/16  1426     History   Chief Complaint Chief Complaint  Patient presents with  . Abscess    HPI Barbara Thomas is a 66 y.o. female.   Patient complains of 10 day history of gradually increasing pain/swelling in her right groin as she has continued to use her exercise bike.  The pain has increased to the extent that she has pain with sitting, walking, and most activities.  Recently there has been small amounts of bloody drainage from the site.  She denies fevers, chills, and sweats.  The area has not responded to warm sitz baths. She has a history of hip replacement.   The history is provided by the patient.  Abscess  Abscess location: Right labia/groin. Abscess quality: draining, fluctuance, induration, painful, redness and warmth   Duration:  10 days Progression:  Worsening Pain details:    Quality:  Aching   Severity:  Moderate   Duration:  10 days   Timing:  Constant   Progression:  Worsening Chronicity:  New Context: skin injury   Relieved by:  Nothing Exacerbated by: sitting, walking. Ineffective treatments:  Warm water soaks Associated symptoms: no anorexia, no fatigue, no fever and no nausea     Past Medical History:  Diagnosis Date  . Arthritis   . GERD (gastroesophageal reflux disease)   . H/O hiatal hernia   . History of basal cell carcinoma excision    NOSE  . History of DVT of lower extremity    11/ 2011  BILATERAL  POST FOOT SURGERY  . History of pulmonary embolus (PE)    12/ 2011   POST FOOT SURGERY  . Left ureteral calculus   . Migraines   . OSA on CPAP    STUDY DONE 2012  . PONV (postoperative nausea and vomiting)    severe  . Sigmoid diverticulosis   . Wears glasses     Patient Active Problem List   Diagnosis Date Noted  . OSA (obstructive sleep apnea) 05/22/2014  . Ureteral calculus, left 11/25/2013  . Obesity (BMI 30-39.9) 02/05/2013  . Pulmonary embolism ([place])  06/09/2011  . GERD (gastroesophageal reflux disease) 06/03/2011  . DVT of lower extremity, bilateral ([place]) 01/10/2011  . Contusion, breast 01/10/2011    Past Surgical History:  Procedure Laterality Date  . BENIGN RIGHT BREAST BX  12-06-2010  . CYSTOSCOPY WITH RETROGRADE PYELOGRAM, URETEROSCOPY AND STENT PLACEMENT Left 11/25/2013   Procedure: CYSTOSCOPY WITH RETROGRADE PYELOGRAM, URETEROSCOPY AND STENT PLACEMENT WITH COLD CUP RESECTION OF BLADDER TUMOR ;  Surgeon: Bernestine Amass, MD;  Location: [facility];  Service: Urology;  Laterality: Left;  . HIP PINNING Left 1985  . HOLMIUM LASER APPLICATION Left 5/80/9983   Procedure: HOLMIUM LASER APPLICATION;  Surgeon: Bernestine Amass, MD;  Location: [facility];  Service: Urology;  Laterality: Left;  . INSERTION OF VENA CAVA FILTER  12/ 2011   ECLIPSE  . ORIF LEFT ANKLE FX  11/ 2011  . PARTIAL HIP ARTHROPLASTY Left 1987  . REVISION HIP HEMIARTHROPLASTY  Left 2004  . VAGINAL HYSTERECTOMY  2005   W/  BILATERAL SALPINGOOPHORECTOMY AND BLADDER SLING PROCEDURE    OB History    No data available       Home Medications    Prior to Admission medications   Medication Sig Start Date End Date Taking? Authorizing Provider  CALCIUM PO Take by mouth.   Yes Historical  Provider, MD  Cholecalciferol (VITAMIN D PO) Take by mouth.   Yes Historical Provider, MD  Omega-3 Fatty Acids (FISH OIL PO) Take by mouth.   Yes Historical Provider, MD  aspirin 81 MG chewable tablet Chew 162 mg by mouth daily.    Historical Provider, MD  cetirizine (ZYRTEC) 10 MG tablet Take 10 mg by mouth daily.    Historical Provider, MD  co-enzyme Q-10 30 MG capsule Take 30 mg by mouth daily.    Historical Provider, MD  Multiple Vitamin (MULTIVITAMIN) tablet Take 1 tablet by mouth daily.      Historical Provider, MD  simvastatin (ZOCOR) 20 MG tablet TAKE 1 TABLET BY MOUTH AT  BEDTIME 02/24/16   Ann Held, DO    Family History Family  History  Problem Relation Age of Onset  . Transient ischemic attack Mother   . Hypertension Mother   . Dementia Mother   . Cancer Father     BLADDER CANCER  . Hypertension Father   . Stroke Paternal Grandmother   . Sudden death Maternal Grandmother   . Diabetes Brother     Social History Social History  Substance Use Topics  . Smoking status: Never Smoker  . Smokeless tobacco: Never Used  . Alcohol use No     Allergies   Penicillins and Morphine and related   Review of Systems Review of Systems  Constitutional: Negative for chills, diaphoresis, fatigue and fever.  Gastrointestinal: Negative for anorexia and nausea.  All other systems reviewed and are negative.    Physical Exam Triage Vital Signs ED Triage Vitals [06/11/16 1536]  Enc Vitals Group     BP 116/79     Pulse Rate (!) 106     Resp      Temp 98.1 F (36.7 C)     Temp Source Oral     SpO2 100 %     Weight 227 lb (103 kg)     Height 5\' 11"  (1.803 m)     Head Circumference      Peak Flow      Pain Score 7     Pain Loc      Pain Edu?      Excl. in [place]?    No data found.   Updated Vital Signs BP 116/79 (BP Location: Left Arm)   Pulse (!) 106   Temp 98.1 F (36.7 C) (Oral)   Ht 5\' 11"  (1.803 m)   Wt 227 lb (103 kg)   SpO2 100%   BMI 31.66 kg/m   Visual Acuity Right Eye Distance:   Left Eye Distance:   Bilateral Distance:    Right Eye Near:   Left Eye Near:    Bilateral Near:     Physical Exam  Constitutional: She appears well-developed and well-nourished. No distress.  HENT:  Head: Normocephalic.  Eyes: Pupils are equal, round, and reactive to light.  Cardiovascular:  Note rate 106  Pulmonary/Chest: Effort normal.  Genitourinary:     Genitourinary Comments: Right labia majora and inguinal area indurated to about 4cm by 7cm,  fluctuant centrally, and tender to palpation.  Small amount of bloody drainage centrally.  Induration does not extend to peri-rectal area.  Neurological:  She is alert.  Skin: Skin is warm and dry.  Nursing note and vitals reviewed.    UC Treatments / Results  Labs (all labs ordered are listed, but only abnormal results are displayed) Labs Reviewed  POCT CBC W AUTO DIFF ([place]):  WBC 11.2; LY 11.7; MO 7.3; GR 81.0; Hgb 15.4; Platelets 288     EKG  EKG Interpretation None       Radiology No results found.  Procedures Procedures (including critical care time)  Medications Ordered in UC Medications  ibuprofen (ADVIL,MOTRIN) tablet 400 mg (not administered)     Initial Impression / Assessment and Plan / UC Course  I have reviewed the triage vital signs and the nursing notes.  Pertinent labs & imaging results that were available during my care of the patient were reviewed by me and considered in my medical decision making (see chart for details).    Note leukocytosis 11.2. Concern that right labial abscess may be more extensive than it appears.   Recommend proceeding to [facility] ED for evaluation/treatment.    Final Clinical Impressions(s) / UC Diagnoses   Final diagnoses:  Abscess of right genital labia    New Prescriptions New Prescriptions   No medications on file     Kandra Nicolas, MD 06/11/16 [phone]

## 2016-06-11 NOTE — ED Triage Notes (Signed)
She reports feeling some pain at right inner thigh. She was seen at pcp today, who diagnosed as a right labial abscess.

## 2016-06-11 NOTE — ED Triage Notes (Signed)
Pt c/o possible abscess in the groin area, very painful and uncomfortable, drainage

## 2016-06-11 NOTE — ED Notes (Signed)
Assisted Md with abscess drainage.

## 2016-06-11 NOTE — ED Provider Notes (Signed)
[place] DEPT Provider Note   CSN: [id number] Arrival date & time: 06/11/16  1832  By signing my name below, I, Barbara Thomas, attest that this documentation has been prepared under the direction and in the presence of Virgel Manifold, MD. Electronically Signed: Reola Thomas, ED Scribe. 06/11/16. 9:02 PM.  History   Chief Complaint Chief Complaint  Patient presents with  . Abscess   The history is provided by the patient and medical records. No language interpreter was used.    HPI Comments: Barbara Thomas is a 66 y.o. female w/ a h/o obesity, who presents to the Emergency Department complaining of a moderate, gradually worsening area of pain and swelling to the right groin area onset ten days ago, worsening over the past 4-5 days. She reports that the area has been bleeding over the past several days as well. She was seen by UC prior to her arrival in the ED and at that time she was diagnosed with a right labial abscess and subsequently referred into the ED for further management. Blood work was drawn at that time which as remarkable for a leukocytosis at 11.2. She has been using warm sitz baths at home without relief of the area. Pt states pain is exacerbated with palpation and direct pressure. No h/o similar abscesses. Denies fever, chills, or any other associated symptoms.   Past Medical History:  Diagnosis Date  . Arthritis   . GERD (gastroesophageal reflux disease)   . H/O hiatal hernia   . History of basal cell carcinoma excision    NOSE  . History of DVT of lower extremity    11/ 2011  BILATERAL  POST FOOT SURGERY  . History of pulmonary embolus (PE)    12/ 2011   POST FOOT SURGERY  . Left ureteral calculus   . Migraines   . OSA on CPAP    STUDY DONE 2012  . PONV (postoperative nausea and vomiting)    severe  . Sigmoid diverticulosis   . Wears glasses    Patient Active Problem List   Diagnosis Date Noted  . OSA (obstructive sleep apnea) 05/22/2014  .  Ureteral calculus, left 11/25/2013  . Obesity (BMI 30-39.9) 02/05/2013  . Pulmonary embolism ([place]) 06/09/2011  . GERD (gastroesophageal reflux disease) 06/03/2011  . DVT of lower extremity, bilateral ([place]) 01/10/2011  . Contusion, breast 01/10/2011   Past Surgical History:  Procedure Laterality Date  . BENIGN RIGHT BREAST BX  12-06-2010  . CYSTOSCOPY WITH RETROGRADE PYELOGRAM, URETEROSCOPY AND STENT PLACEMENT Left 11/25/2013   Procedure: CYSTOSCOPY WITH RETROGRADE PYELOGRAM, URETEROSCOPY AND STENT PLACEMENT WITH COLD CUP RESECTION OF BLADDER TUMOR ;  Surgeon: Bernestine Amass, MD;  Location: [facility];  Service: Urology;  Laterality: Left;  . HIP PINNING Left 1985  . HOLMIUM LASER APPLICATION Left 2/99/2426   Procedure: HOLMIUM LASER APPLICATION;  Surgeon: Bernestine Amass, MD;  Location: [facility];  Service: Urology;  Laterality: Left;  . INSERTION OF VENA CAVA FILTER  12/ 2011   ECLIPSE  . ORIF LEFT ANKLE FX  11/ 2011  . PARTIAL HIP ARTHROPLASTY Left 1987  . REVISION HIP HEMIARTHROPLASTY  Left 2004  . VAGINAL HYSTERECTOMY  2005   W/  BILATERAL SALPINGOOPHORECTOMY AND BLADDER SLING PROCEDURE   OB History    No data available     Home Medications    Prior to Admission medications   Medication Sig Start Date End Date Taking? Authorizing Provider  aspirin 81 MG chewable  tablet Chew 162 mg by mouth daily.    Historical Provider, MD  CALCIUM PO Take by mouth.    Historical Provider, MD  cetirizine (ZYRTEC) 10 MG tablet Take 10 mg by mouth daily.    Historical Provider, MD  Cholecalciferol (VITAMIN D PO) Take by mouth.    Historical Provider, MD  co-enzyme Q-10 30 MG capsule Take 30 mg by mouth daily.    Historical Provider, MD  Multiple Vitamin (MULTIVITAMIN) tablet Take 1 tablet by mouth daily.      Historical Provider, MD  Omega-3 Fatty Acids (FISH OIL PO) Take by mouth.    Historical Provider, MD  simvastatin (ZOCOR) 20 MG tablet TAKE 1 TABLET BY  MOUTH AT  BEDTIME 02/24/16   Ann Held, DO   Family History Family History  Problem Relation Age of Onset  . Transient ischemic attack Mother   . Hypertension Mother   . Dementia Mother   . Cancer Father     BLADDER CANCER  . Hypertension Father   . Stroke Paternal Grandmother   . Sudden death Maternal Grandmother   . Diabetes Brother    Social History Social History  Substance Use Topics  . Smoking status: Never Smoker  . Smokeless tobacco: Never Used  . Alcohol use No   Allergies   Penicillins and Morphine and related  Review of Systems Review of Systems  Constitutional: Negative for chills and fever.  Skin: Positive for wound.  All other systems reviewed and are negative.  Physical Exam Updated Vital Signs BP (!) 146/70 (BP Location: Right Arm)   Pulse 96   Temp 98.6 F (37 C) (Oral)   Resp 18   SpO2 97%   Physical Exam  Constitutional: She appears well-developed and well-nourished.  HENT:  Head: Normocephalic.  Right Ear: External ear normal.  Left Ear: External ear normal.  Nose: Nose normal.  Mouth/Throat: Oropharynx is clear and moist.  Eyes: Conjunctivae are normal. Right eye exhibits no discharge. Left eye exhibits no discharge.  Neck: Normal range of motion.  Cardiovascular: Normal rate, regular rhythm and normal heart sounds.   No murmur heard. Pulmonary/Chest: Effort normal and breath sounds normal. No respiratory distress. She has no wheezes. She has no rales.  Abdominal: Soft. She exhibits no distension. There is no tenderness. There is no rebound and no guarding.  Genitourinary:  Genitourinary Comments: Chaperone present throughout entire exam. 2cm abscess just lateral and inferior to the right labia majora. There is a couple of cm of surrounding cellulitis that does not extend to the anus. This is not a bartholin's cysts.   Musculoskeletal: Normal range of motion. She exhibits no edema or tenderness.  Neurological: She is alert. No  cranial nerve deficit. Coordination normal.  Skin: Skin is warm and dry. No rash noted. No erythema. No pallor.  Psychiatric: She has a normal mood and affect. Her behavior is normal.  Nursing note and vitals reviewed.  ED Treatments / Results  DIAGNOSTIC STUDIES: Oxygen Saturation is 97% on RA, normal by my interpretation.   COORDINATION OF CARE: 9:02 PM-Discussed next steps with pt. Pt verbalized understanding and is agreeable with the plan.   Labs (all labs ordered are listed, but only abnormal results are displayed) Labs Reviewed - No data to display  EKG  EKG Interpretation None      Radiology No results found.  Procedures .Marland KitchenIncision and Drainage Date/Time: 06/11/2016 9:06 PM Performed by: Virgel Manifold Authorized by: Virgel Manifold   Consent:  Consent obtained:  Verbal   Consent given by:  Patient   Risks discussed:  Bleeding, incomplete drainage, pain and infection   Alternatives discussed:  No treatment Universal protocol:    Procedure explained and questions answered to patient or proxy's satisfaction: yes     Relevant documents present and verified: yes     Test results available and properly labeled: yes     Imaging studies available: yes     Required blood products, implants, devices, and special equipment available: yes     Site/side marked: yes     Immediately prior to procedure a time out was called: yes     Patient identity confirmed:  Verbally with patient and arm band Location:    Type:  Abscess   Size:  2   Location:  Anogenital   Anogenital location: just lateral and inferior to the right labia major. Pre-procedure details:    Skin preparation:  Betadine Sedation:    Sedation type: none. Anesthesia (see MAR for exact dosages):    Anesthesia method:  Local infiltration   Local anesthetic:  Lidocaine 1% w/o epi Procedure type:    Complexity:  Simple Procedure details:    Needle aspiration: no     Incision types:  Stab incision    Incision depth:  Dermal   Scalpel blade:  11   Wound management:  Probed and deloculated, irrigated with saline and extensive cleaning   Drainage:  Bloody and purulent   Drainage amount:  Moderate   Wound treatment:  Wound left open   Packing materials:  None Post-procedure details:    Patient tolerance of procedure:  Tolerated well, no immediate complications       Medications Ordered in ED Medications  lidocaine (PF) (XYLOCAINE) 1 % injection 30 mL (not administered)   Initial Impression / Assessment and Plan / ED Course  I have reviewed the triage vital signs and the nursing notes.  Pertinent labs & imaging results that were available during my care of the patient were reviewed by me and considered in my medical decision making (see chart for details).     75yF with abscess. Mild cellulitis. I&D'd. Abx and continued wound care.  Final Clinical Impressions(s) / ED Diagnoses   Final diagnoses:  Abscess, groin   New Prescriptions New Prescriptions   No medications on file   I personally preformed the services scribed in my presence. The recorded information has been reviewed is accurate. Virgel Manifold, MD.     Virgel Manifold, MD 06/19/16 [phone]

## 2016-06-14 ENCOUNTER — Telehealth: Payer: Self-pay | Admitting: *Deleted

## 2016-06-14 NOTE — Telephone Encounter (Signed)
LM to check pts status and to call back if she has any questions or concerns.

## 2016-08-25 ENCOUNTER — Encounter: Payer: Self-pay | Admitting: Family Medicine

## 2016-08-25 ENCOUNTER — Ambulatory Visit (INDEPENDENT_AMBULATORY_CARE_PROVIDER_SITE_OTHER): Payer: 59 | Admitting: Family Medicine

## 2016-08-25 VITALS — BP 126/80 | HR 91 | Temp 98.1°F | Resp 16 | Ht 71.0 in | Wt 229.0 lb

## 2016-08-25 DIAGNOSIS — E785 Hyperlipidemia, unspecified: Secondary | ICD-10-CM | POA: Diagnosis not present

## 2016-08-25 DIAGNOSIS — I10 Essential (primary) hypertension: Secondary | ICD-10-CM

## 2016-08-25 DIAGNOSIS — Z0000 Encounter for general adult medical examination without abnormal findings: Secondary | ICD-10-CM

## 2016-08-25 DIAGNOSIS — J301 Allergic rhinitis due to pollen: Secondary | ICD-10-CM | POA: Diagnosis not present

## 2016-08-25 LAB — POC URINALSYSI DIPSTICK (AUTOMATED)
Bilirubin, UA: NEGATIVE
Blood, UA: NEGATIVE
Glucose, UA: NEGATIVE
Ketones, UA: NEGATIVE
LEUKOCYTES UA: NEGATIVE
NITRITE UA: NEGATIVE
PH UA: 6 (ref 5.0–8.0)
PROTEIN UA: NEGATIVE
Spec Grav, UA: 1.025 (ref 1.010–1.025)
Urobilinogen, UA: 0.2 E.U./dL

## 2016-08-25 LAB — LIPID PANEL
CHOL/HDL RATIO: 3
Cholesterol: 167 mg/dL (ref 0–200)
HDL: 54.5 mg/dL (ref >39.00)
LDL Cholesterol: 77 mg/dL (ref 0–99)
NONHDL: 112.95
Triglycerides: 178 mg/dL — ABNORMAL HIGH (ref 0.0–149.0)
VLDL: 35.6 mg/dL (ref 0.0–40.0)

## 2016-08-25 LAB — COMPREHENSIVE METABOLIC PANEL
ALK PHOS: 101 U/L (ref 39–117)
ALT: 24 U/L (ref 0–35)
AST: 19 U/L (ref 0–37)
Albumin: 4.6 g/dL (ref 3.5–5.2)
BILIRUBIN TOTAL: 0.6 mg/dL (ref 0.2–1.2)
BUN: 19 mg/dL (ref 6–23)
CO2: 26 mEq/L (ref 19–32)
CREATININE: 0.87 mg/dL (ref 0.40–1.20)
Calcium: 10.1 mg/dL (ref 8.4–10.5)
Chloride: 104 mEq/L (ref 96–112)
GFR: 69.5 mL/min (ref >60.00)
GLUCOSE: 108 mg/dL — AB (ref 70–99)
Potassium: 4 mEq/L (ref 3.5–5.1)
Sodium: 137 mEq/L (ref 135–145)
TOTAL PROTEIN: 7.6 g/dL (ref 6.0–8.3)

## 2016-08-25 LAB — CBC WITH DIFFERENTIAL/PLATELET
BASOS ABS: 0.1 10*3/uL (ref 0.0–0.1)
Basophils Relative: 0.7 % (ref 0.0–3.0)
EOS ABS: 0.2 10*3/uL (ref 0.0–0.7)
Eosinophils Relative: 2.8 % (ref 0.0–5.0)
HEMATOCRIT: 43.8 % (ref 36.0–46.0)
Hemoglobin: 14.3 g/dL (ref 12.0–15.0)
LYMPHS PCT: 23.2 % (ref 12.0–46.0)
Lymphs Abs: 1.7 10*3/uL (ref 0.7–4.0)
MCHC: 32.7 g/dL (ref 30.0–36.0)
MCV: 86.7 fl (ref 78.0–100.0)
MONOS PCT: 8.4 % (ref 3.0–12.0)
Monocytes Absolute: 0.6 10*3/uL (ref 0.1–1.0)
Neutro Abs: 4.6 10*3/uL (ref 1.4–7.7)
Neutrophils Relative %: 64.9 % (ref 43.0–77.0)
Platelets: 253 10*3/uL (ref 150.0–400.0)
RBC: 5.05 Mil/uL (ref 3.87–5.11)
RDW: 14.1 % (ref 11.5–15.5)
WBC: 7.1 10*3/uL (ref 4.0–10.5)

## 2016-08-25 MED ORDER — MONTELUKAST SODIUM 10 MG PO TABS
10.0000 mg | ORAL_TABLET | Freq: Every day | ORAL | 5 refills | Status: DC
Start: 2016-08-25 — End: 2017-01-26

## 2016-08-25 NOTE — Patient Instructions (Signed)
Preventive Care 40-64 Years, Female Preventive care refers to lifestyle choices and visits with your health care provider that can promote health and wellness. What does preventive care include?  A yearly physical exam. This is also called an annual well check.  Dental exams once or twice a year.  Routine eye exams. Ask your health care provider how often you should have your eyes checked.  Personal lifestyle choices, including: ? Daily care of your teeth and gums. ? Regular physical activity. ? Eating a healthy diet. ? Avoiding tobacco and drug use. ? Limiting alcohol use. ? Practicing safe sex. ? Taking low-dose aspirin daily starting at age 58. ? Taking vitamin and mineral supplements as recommended by your health care provider. What happens during an annual well check? The services and screenings done by your health care provider during your annual well check will depend on your age, overall health, lifestyle risk factors, and family history of disease. Counseling Your health care provider may ask you questions about your:  Alcohol use.  Tobacco use.  Drug use.  Emotional well-being.  Home and relationship well-being.  Sexual activity.  Eating habits.  Work and work Statistician.  Method of birth control.  Menstrual cycle.  Pregnancy history.  Screening You may have the following tests or measurements:  Height, weight, and BMI.  Blood pressure.  Lipid and cholesterol levels. These may be checked every 5 years, or more frequently if you are over 81 years old.  Skin check.  Lung cancer screening. You may have this screening every year starting at age 78 if you have a 30-pack-year history of smoking and currently smoke or have quit within the past 15 years.  Fecal occult blood test (FOBT) of the stool. You may have this test every year starting at age 65.  Flexible sigmoidoscopy or colonoscopy. You may have a sigmoidoscopy every 5 years or a colonoscopy  every 10 years starting at age 30.  Hepatitis C blood test.  Hepatitis B blood test.  Sexually transmitted disease (STD) testing.  Diabetes screening. This is done by checking your blood sugar (glucose) after you have not eaten for a while (fasting). You may have this done every 1-3 years.  Mammogram. This may be done every 1-2 years. Talk to your health care provider about when you should start having regular mammograms. This may depend on whether you have a family history of breast cancer.  BRCA-related cancer screening. This may be done if you have a family history of breast, ovarian, tubal, or peritoneal cancers.  Pelvic exam and Pap test. This may be done every 3 years starting at age 80. Starting at age 36, this may be done every 5 years if you have a Pap test in combination with an HPV test.  Bone density scan. This is done to screen for osteoporosis. You may have this scan if you are at high risk for osteoporosis.  Discuss your test results, treatment options, and if necessary, the need for more tests with your health care provider. Vaccines Your health care provider may recommend certain vaccines, such as:  Influenza vaccine. This is recommended every year.  Tetanus, diphtheria, and acellular pertussis (Tdap, Td) vaccine. You may need a Td booster every 10 years.  Varicella vaccine. You may need this if you have not been vaccinated.  Zoster vaccine. You may need this after age 5.  Measles, mumps, and rubella (MMR) vaccine. You may need at least one dose of MMR if you were born in  1956 or later. You may also need a second dose.  Pneumococcal 13-valent conjugate (PCV13) vaccine. You may need this if you have certain conditions and were not previously vaccinated.  Pneumococcal polysaccharide (PPSV23) vaccine. You may need one or two doses if you smoke cigarettes or if you have certain conditions.  Meningococcal vaccine. You may need this if you have certain  conditions.  Hepatitis A vaccine. You may need this if you have certain conditions or if you travel or work in places where you may be exposed to hepatitis A.  Hepatitis B vaccine. You may need this if you have certain conditions or if you travel or work in places where you may be exposed to hepatitis B.  Haemophilus influenzae type b (Hib) vaccine. You may need this if you have certain conditions.  Talk to your health care provider about which screenings and vaccines you need and how often you need them. This information is not intended to replace advice given to you by your health care provider. Make sure you discuss any questions you have with your health care provider. Document Released: 03/27/2015 Document Revised: 11/18/2015 Document Reviewed: 12/30/2014 Elsevier Interactive Patient Education  2017 [employer].

## 2016-08-25 NOTE — Progress Notes (Signed)
Subjective:   I acted as a Education administrator for Dr. Carollee Herter.  Guerry Bruin, CMA   Barbara Thomas is a 66 y.o. female and is here for a comprehensive physical exam. The patient reports no problems.  Social History   Social History  . Marital status: Married    Spouse name: N/A  . Number of children: N/A  . Years of education: N/A   Occupational History  . housewife    Social History Main Topics  . Smoking status: Never Smoker  . Smokeless tobacco: Never Used  . Alcohol use No  . Drug use: No  . Sexual activity: Not on file   Other Topics Concern  . Not on file   Social History Narrative   Exercise--- walking--- qd short distance   Health Maintenance  Topic Date Due  . COLONOSCOPY  05/16/2016  . HIV Screening  08/26/2027 (Originally 11/08/1965)  . INFLUENZA VACCINE  10/12/2016  . MAMMOGRAM  01/31/2017  . TETANUS/TDAP  02/06/2023  . Hepatitis C Screening  Completed    The following portions of the patient's history were reviewed and updated as appropriate:  She  has a past medical history of Arthritis; GERD (gastroesophageal reflux disease); H/O hiatal hernia; History of basal cell carcinoma excision; History of DVT of lower extremity; History of pulmonary embolus (PE); Left ureteral calculus; Migraines; OSA on CPAP; PONV (postoperative nausea and vomiting); Sigmoid diverticulosis; and Wears glasses. She  does not have any pertinent problems on file. She  has a past surgical history that includes Hip pinning (Left, 1985); BENIGN RIGHT BREAST BX (12-06-2010); Insertion of vena cava filter (12/ 2011); ORIF LEFT ANKLE FX (11/ 2011); Partial hip arthroplasty (Left, 1987); REVISION HIP HEMIARTHROPLASTY  (Left, 2004); Cystoscopy with retrograde pyelogram, ureteroscopy and stent placement (Left, 11/25/2013); Holmium laser application (Left, 2/44/0102); and Vaginal hysterectomy (2005). Her family history includes Cancer in her father; Dementia in her mother; Diabetes in her brother; Hypertension in  her father and mother; Stroke in her paternal grandmother; Sudden death in her maternal grandmother; Transient ischemic attack in her mother. She  reports that she has never smoked. She has never used smokeless tobacco. She reports that she does not drink alcohol or use drugs. She has a current medication list which includes the following prescription(s): aspirin, calcium, cetirizine, cholecalciferol, co-enzyme q-10, multivitamin, omega-3 fatty acids, simvastatin, and montelukast. Current Outpatient Prescriptions on File Prior to Visit  Medication Sig Dispense Refill  . aspirin 81 MG chewable tablet Chew 162 mg by mouth daily.    Marland Kitchen CALCIUM PO Take by mouth.    . cetirizine (ZYRTEC) 10 MG tablet Take 10 mg by mouth daily.    . Cholecalciferol (VITAMIN D PO) Take by mouth.    . co-enzyme Q-10 30 MG capsule Take 30 mg by mouth daily.    . Multiple Vitamin (MULTIVITAMIN) tablet Take 1 tablet by mouth daily.      . Omega-3 Fatty Acids (FISH OIL PO) Take by mouth.    . simvastatin (ZOCOR) 20 MG tablet TAKE 1 TABLET BY MOUTH AT  BEDTIME 90 tablet 5   No current facility-administered medications on file prior to visit.    She is allergic to penicillins and morphine and related..  Review of Systems Review of Systems  Constitutional: Negative for activity change, appetite change and fatigue.  HENT: Negative for hearing loss, congestion, tinnitus and ear discharge.  dentist q85m Eyes: Negative for visual disturbance (see optho q1y -- vision corrected to 20/20 with glasses).  Respiratory: Negative for  cough, chest tightness and shortness of breath.   Cardiovascular: Negative for chest pain, palpitations and leg swelling.  Gastrointestinal: Negative for abdominal pain, diarrhea, constipation and abdominal distention.  Genitourinary: Negative for urgency, frequency, decreased urine volume and difficulty urinating.  Musculoskeletal: Negative for back pain, arthralgias and gait problem.  Skin: Negative  for color change, pallor and rash.  Neurological: Negative for dizziness, light-headedness, numbness and headaches.  Hematological: Negative for adenopathy. Does not bruise/bleed easily.  Psychiatric/Behavioral: Negative for suicidal ideas, confusion, sleep disturbance, self-injury, dysphoric mood, decreased concentration and agitation.       Objective:    BP 126/80 (BP Location: Left Arm, Cuff Size: Large)   Pulse 91   Temp 98.1 F (36.7 C) (Oral)   Resp 16   Ht 5\' 11"  (1.803 m)   Wt 229 lb (103.9 kg)   SpO2 (!) 9%   BMI 31.94 kg/m  General appearance: alert, cooperative, appears stated age and no distress Head: Normocephalic, without obvious abnormality, atraumatic Eyes: conjunctivae/corneas clear. PERRL, EOM's intact. Fundi benign. Ears: normal TM's and external ear canals both ears Nose: Nares normal. Septum midline. Mucosa normal. No drainage or sinus tenderness. Throat: lips, mucosa, and tongue normal; teeth and gums normal Neck: no adenopathy, no carotid bruit, no JVD, supple, symmetrical, trachea midline and thyroid not enlarged, symmetric, no tenderness/mass/nodules Back: symmetric, no curvature. ROM normal. No CVA tenderness. Lungs: clear to auscultation bilaterally Breasts: normal appearance, no masses or tenderness Heart: regular rate and rhythm, S1, S2 normal, no murmur, click, rub or gallop Abdomen: soft, non-tender; bowel sounds normal; no masses,  no organomegaly Pelvic: not indicated; status post hysterectomy, negative ROS Extremities: extremities normal, atraumatic, no cyanosis or edema Pulses: 2+ and symmetric Skin: Skin color, texture, turgor normal. No rashes or lesions Lymph nodes: Cervical, supraclavicular, and axillary nodes normal. Neurologic: Alert and oriented X 3, normal strength and tone. Normal symmetric reflexes. Normal coordination and gait    Assessment:    Healthy female exam.      Plan:    ghm utd Check labs See After Visit Summary  for Counseling Recommendations    1. Essential hypertension Well controlled, no changes to meds. Encouraged heart healthy diet such as the DASH diet and exercise as tolerated.  - POCT Urinalysis Dipstick (Automated) - CBC with Differential/Platelet  2. Hyperlipidemia, unspecified hyperlipidemia type Tolerating statin, encouraged heart healthy diet, avoid trans fats, minimize simple carbs and saturated fats. Increase exercise as tolerated - POCT Urinalysis Dipstick (Automated) - Comprehensive metabolic panel - Lipid panel  3. Preventative health care See above  4. Seasonal allergic rhinitis due to pollen  - montelukast (SINGULAIR) 10 MG tablet; Take 1 tablet (10 mg total) by mouth at bedtime.  Dispense: 30 tablet; Refill: 5

## 2016-08-30 ENCOUNTER — Ambulatory Visit (INDEPENDENT_AMBULATORY_CARE_PROVIDER_SITE_OTHER): Payer: 59 | Admitting: Family Medicine

## 2016-08-30 ENCOUNTER — Encounter: Payer: Self-pay | Admitting: Family Medicine

## 2016-08-30 ENCOUNTER — Other Ambulatory Visit: Payer: Self-pay | Admitting: Family Medicine

## 2016-08-30 VITALS — BP 120/80 | HR 67 | Temp 98.0°F | Resp 16 | Ht 71.0 in | Wt 229.2 lb

## 2016-08-30 DIAGNOSIS — H66003 Acute suppurative otitis media without spontaneous rupture of ear drum, bilateral: Secondary | ICD-10-CM | POA: Diagnosis not present

## 2016-08-30 DIAGNOSIS — E785 Hyperlipidemia, unspecified: Secondary | ICD-10-CM

## 2016-08-30 MED ORDER — OFLOXACIN 0.3 % OT SOLN: 10.0000 [drp] | Freq: Every day | OTIC | Status: AC

## 2016-08-30 MED ORDER — AZITHROMYCIN 250 MG PO TABS: ORAL_TABLET | ORAL | 0 refills | Status: DC

## 2016-08-30 MED FILL — AZITHROMYCIN 250 MG TABLET: 250 | 5 days supply | Qty: 6 | Fill #0

## 2016-08-30 NOTE — Progress Notes (Signed)
Patient ID: Barbara Thomas, female   DOB: [birth year], 66 y.o.   MRN: [id number]     Subjective:  I acted as a Education administrator for Dr. Carollee Herter.  Guerry Bruin, [place]   Patient ID: Barbara Thomas, female    DOB: [birth year], 66 y.o.   MRN: [id number]  Chief Complaint  Patient presents with  . Cerumen Impaction    both ears    HPI  Patient is in today for cerumen impaction in both ears for about 2 weeks.  Has use Debrox to soften wax.  She still does not feel clear.  Patient Care Team: Carollee Herter, Alferd Apa, DO as PCP - General (Family Medicine) Rana Snare, MD as Consulting Physician (Urology)   Past Medical History:  Diagnosis Date  . Arthritis   . GERD (gastroesophageal reflux disease)   . H/O hiatal hernia   . History of basal cell carcinoma excision    NOSE  . History of DVT of lower extremity    11/ 2011  BILATERAL  POST FOOT SURGERY  . History of pulmonary embolus (PE)    12/ 2011   POST FOOT SURGERY  . Left ureteral calculus   . Migraines   . OSA on CPAP    STUDY DONE 2012  . PONV (postoperative nausea and vomiting)    severe  . Sigmoid diverticulosis   . Wears glasses     Past Surgical History:  Procedure Laterality Date  . BENIGN RIGHT BREAST BX  12-06-2010  . CYSTOSCOPY WITH RETROGRADE PYELOGRAM, URETEROSCOPY AND STENT PLACEMENT Left 11/25/2013   Procedure: CYSTOSCOPY WITH RETROGRADE PYELOGRAM, URETEROSCOPY AND STENT PLACEMENT WITH COLD CUP RESECTION OF BLADDER TUMOR ;  Surgeon: Bernestine Amass, MD;  Location: [facility];  Service: Urology;  Laterality: Left;  . HIP PINNING Left 1985  . HOLMIUM LASER APPLICATION Left 10/06/3662   Procedure: HOLMIUM LASER APPLICATION;  Surgeon: Bernestine Amass, MD;  Location: [facility];  Service: Urology;  Laterality: Left;  . INSERTION OF VENA CAVA FILTER  12/ 2011   ECLIPSE  . ORIF LEFT ANKLE FX  11/ 2011  . PARTIAL HIP ARTHROPLASTY Left 1987  . REVISION HIP HEMIARTHROPLASTY  Left 2004  . VAGINAL HYSTERECTOMY   2005   W/  BILATERAL SALPINGOOPHORECTOMY AND BLADDER SLING PROCEDURE    Family History  Problem Relation Age of Onset  . Transient ischemic attack Mother   . Hypertension Mother   . Dementia Mother   . Cancer Father        BLADDER CANCER  . Hypertension Father   . Stroke Paternal Grandmother   . Sudden death Maternal Grandmother   . Diabetes Brother     Social History   Social History  . Marital status: Married    Spouse name: N/A  . Number of children: N/A  . Years of education: N/A   Occupational History  . housewife    Social History Main Topics  . Smoking status: Never Smoker  . Smokeless tobacco: Never Used  . Alcohol use No  . Drug use: No  . Sexual activity: Not on file   Other Topics Concern  . Not on file   Social History Narrative   Exercise--- walking--- qd short distance    Outpatient Medications Prior to Visit  Medication Sig Dispense Refill  . aspirin 81 MG chewable tablet Chew 162 mg by mouth daily.    Marland Kitchen CALCIUM PO Take by mouth.    . Cholecalciferol (VITAMIN D PO) Take by  mouth.    . co-enzyme Q-10 30 MG capsule Take 30 mg by mouth daily.    . montelukast (SINGULAIR) 10 MG tablet Take 1 tablet (10 mg total) by mouth at bedtime. 30 tablet 5  . Multiple Vitamin (MULTIVITAMIN) tablet Take 1 tablet by mouth daily.      . Omega-3 Fatty Acids (FISH OIL PO) Take by mouth.    . simvastatin (ZOCOR) 20 MG tablet TAKE 1 TABLET BY MOUTH AT  BEDTIME 90 tablet 5  . cetirizine (ZYRTEC) 10 MG tablet Take 10 mg by mouth daily.     No facility-administered medications prior to visit.     Allergies  Allergen Reactions  . Penicillins Rash  . Morphine And Related Nausea And Vomiting    Review of Systems  Constitutional: Negative for fever and malaise/fatigue.  HENT: Negative for congestion.        Cerumen impaction both ears   Eyes: Negative for blurred vision.  Respiratory: Negative for cough and shortness of breath.   Cardiovascular: Negative for  chest pain, palpitations and leg swelling.  Gastrointestinal: Negative for vomiting.  Musculoskeletal: Negative for back pain.  Skin: Negative for rash.  Neurological: Negative for loss of consciousness and headaches.       Objective:    Physical Exam  Constitutional: She is oriented to person, place, and time. She appears well-developed and well-nourished. No distress.  HENT:  Head: Normocephalic and atraumatic.  Left Ear: Tympanic membrane is injected and erythematous.  Ears:  Eyes: Conjunctivae are normal.  Neck: Normal range of motion. No thyromegaly present.  Cardiovascular: Normal rate and regular rhythm.   Pulmonary/Chest: Effort normal and breath sounds normal. She has no wheezes.  Abdominal: Soft. Bowel sounds are normal. There is no tenderness.  Musculoskeletal: Normal range of motion. She exhibits no edema or deformity.  Neurological: She is alert and oriented to person, place, and time.  Skin: Skin is warm and dry. She is not diaphoretic.  Psychiatric: She has a normal mood and affect.  Nursing note and vitals reviewed.   BP 120/80 (BP Location: Left Arm, Cuff Size: Large)   Pulse 67   Temp 98 F (36.7 C) (Oral)   Resp 16   Ht 5\' 11"  (1.803 m)   Wt 229 lb 3.2 oz (104 kg)   SpO2 97%   BMI 31.97 kg/m  Wt Readings from Last 3 Encounters:  08/30/16 229 lb 3.2 oz (104 kg)  08/25/16 229 lb (103.9 kg)  06/11/16 227 lb (103 kg)   BP Readings from Last 3 Encounters:  08/30/16 120/80  08/25/16 126/80  06/11/16 (!) 156/104     Immunization History  Administered Date(s) Administered  . Influenza Split 12/28/2011  . Influenza Whole 01/11/2013  . Influenza-Unspecified 02/18/2014  . Pneumococcal Polysaccharide-23 01/12/2010  . Tdap 02/05/2013  . Zoster 07/13/2012    Health Maintenance  Topic Date Due  . COLONOSCOPY  05/16/2016  . HIV Screening  08/26/2027 (Originally 11/08/1965)  . INFLUENZA VACCINE  10/12/2016  . MAMMOGRAM  01/31/2017  . TETANUS/TDAP   02/06/2023  . Hepatitis C Screening  Completed    Lab Results  Component Value Date   WBC 7.1 08/25/2016   HGB 14.3 08/25/2016   HCT 43.8 08/25/2016   PLT 253.0 08/25/2016   GLUCOSE 108 (H) 08/25/2016   CHOL 167 08/25/2016   TRIG 178.0 (H) 08/25/2016   HDL 54.50 08/25/2016   LDLDIRECT 199.0 02/05/2013   LDLCALC 77 08/25/2016   ALT 24 08/25/2016  AST 19 08/25/2016   NA 137 08/25/2016   K 4.0 08/25/2016   CL 104 08/25/2016   CREATININE 0.87 08/25/2016   BUN 19 08/25/2016   CO2 26 08/25/2016   TSH 1.98 08/25/2015   INR 0.95 11/21/2013   HGBA1C 6.4 02/16/2016    Lab Results  Component Value Date   TSH 1.98 08/25/2015   Lab Results  Component Value Date   WBC 7.1 08/25/2016   HGB 14.3 08/25/2016   HCT 43.8 08/25/2016   MCV 86.7 08/25/2016   PLT 253.0 08/25/2016   Lab Results  Component Value Date   NA 137 08/25/2016   K 4.0 08/25/2016   CO2 26 08/25/2016   GLUCOSE 108 (H) 08/25/2016   BUN 19 08/25/2016   CREATININE 0.87 08/25/2016   BILITOT 0.6 08/25/2016   ALKPHOS 101 08/25/2016   AST 19 08/25/2016   ALT 24 08/25/2016   PROT 7.6 08/25/2016   ALBUMIN 4.6 08/25/2016   CALCIUM 10.1 08/25/2016   ANIONGAP 7 07/11/2014   GFR 69.50 08/25/2016   Lab Results  Component Value Date   CHOL 167 08/25/2016   Lab Results  Component Value Date   HDL 54.50 08/25/2016   Lab Results  Component Value Date   LDLCALC 77 08/25/2016   Lab Results  Component Value Date   TRIG 178.0 (H) 08/25/2016   Lab Results  Component Value Date   CHOLHDL 3 08/25/2016   Lab Results  Component Value Date   HGBA1C 6.4 02/16/2016         Assessment & Plan:   Problem List Items Addressed This Visit    None    Visit Diagnoses    Acute suppurative otitis media of both ears without spontaneous rupture of tympanic membranes, recurrence not specified    -  Primary   Relevant Medications   ofloxacin (FLOXIN) 0.3 % OTIC (EAR) solution 10 drop   azithromycin (ZITHROMAX Z-PAK)  250 MG tablet      I have discontinued Ms. Tenbrink's cetirizine. I am also having her start on azithromycin. Additionally, I am having her maintain her multivitamin, aspirin, co-enzyme Q-10, simvastatin, Cholecalciferol (VITAMIN D PO), CALCIUM PO, Omega-3 Fatty Acids (FISH OIL PO), and montelukast. We will continue to administer ofloxacin.  Meds ordered this encounter  Medications  . ofloxacin (FLOXIN) 0.3 % OTIC (EAR) solution 10 drop  . azithromycin (ZITHROMAX Z-PAK) 250 MG tablet    Sig: As directed    Dispense:  6 each    Refill:  0    CMA served as scribe during this visit. History, Physical and Plan performed by medical provider. Documentation and orders reviewed and attested to.  Ann Held, DO

## 2016-08-30 NOTE — Patient Instructions (Signed)

## 2016-11-28 ENCOUNTER — Other Ambulatory Visit: Payer: 59

## 2016-12-06 ENCOUNTER — Telehealth: Payer: Self-pay | Admitting: Family Medicine

## 2016-12-06 ENCOUNTER — Other Ambulatory Visit (INDEPENDENT_AMBULATORY_CARE_PROVIDER_SITE_OTHER): Payer: Medicare Other

## 2016-12-06 DIAGNOSIS — E785 Hyperlipidemia, unspecified: Secondary | ICD-10-CM | POA: Diagnosis not present

## 2016-12-06 DIAGNOSIS — Z1211 Encounter for screening for malignant neoplasm of colon: Secondary | ICD-10-CM

## 2016-12-06 LAB — COMPREHENSIVE METABOLIC PANEL WITH GFR
ALT: 25 U/L (ref 0–35)
AST: 21 U/L (ref 0–37)
Albumin: 4.2 g/dL (ref 3.5–5.2)
Alkaline Phosphatase: 77 U/L (ref 39–117)
BUN: 18 mg/dL (ref 6–23)
CO2: 29 meq/L (ref 19–32)
Calcium: 9.5 mg/dL (ref 8.4–10.5)
Chloride: 104 meq/L (ref 96–112)
Creatinine, Ser: 0.91 mg/dL (ref 0.40–1.20)
GFR: 65.93 mL/min
Glucose, Bld: 124 mg/dL — ABNORMAL HIGH (ref 70–99)
Potassium: 4.2 meq/L (ref 3.5–5.1)
Sodium: 139 meq/L (ref 135–145)
Total Bilirubin: 0.6 mg/dL (ref 0.2–1.2)
Total Protein: 7 g/dL (ref 6.0–8.3)

## 2016-12-06 LAB — LIPID PANEL
CHOLESTEROL: 154 mg/dL (ref 0–200)
HDL: 55.4 mg/dL (ref >39.00)
LDL CALC: 77 mg/dL (ref 0–99)
NonHDL: 98.77
TRIGLYCERIDES: 107 mg/dL (ref 0.0–149.0)
Total CHOL/HDL Ratio: 3
VLDL: 21.4 mg/dL (ref 0.0–40.0)

## 2016-12-06 NOTE — Telephone Encounter (Signed)
Pt would like to have a colonoscopy. Please place orders.

## 2016-12-09 ENCOUNTER — Encounter: Payer: Self-pay | Admitting: Internal Medicine

## 2016-12-09 NOTE — Addendum Note (Signed)
Addended by: Debbrah Alar on: 12/09/2016 01:07 PM   Modules accepted: Orders

## 2016-12-27 ENCOUNTER — Telehealth: Payer: Self-pay | Admitting: Family Medicine

## 2016-12-27 DIAGNOSIS — E785 Hyperlipidemia, unspecified: Secondary | ICD-10-CM

## 2016-12-27 NOTE — Telephone Encounter (Signed)
Pt says that she is now 33 and need to switch her medications sent to [facility]   90 day supply    simvastatin - 90 day supply  Vitamin D - 90 day supply    Pt says that she currently have refills on Montelukast but going forward she would like to have all 3 Rx sent to   [facility].

## 2017-01-03 ENCOUNTER — Ambulatory Visit (AMBULATORY_SURGERY_CENTER): Payer: Self-pay | Admitting: *Deleted

## 2017-01-03 VITALS — Ht 71.0 in | Wt 231.2 lb

## 2017-01-03 DIAGNOSIS — Z23 Encounter for immunization: Secondary | ICD-10-CM | POA: Diagnosis not present

## 2017-01-03 DIAGNOSIS — Z1211 Encounter for screening for malignant neoplasm of colon: Secondary | ICD-10-CM

## 2017-01-03 MED ORDER — NA SULFATE-K SULFATE-MG SULF 17.5-3.13-1.6 GM/177ML PO SOLN: 1.0000 [IU] | Freq: Once | ORAL | 0 refills | Status: AC

## 2017-01-03 NOTE — Progress Notes (Signed)
No egg or soy allergy known to patient  Has had  issues with past sedation with any surgeries  or procedures with all but the last procedure for kidney stones 2015 (nausea and Vomiting), no intubation problems    No diet pills per patient No home 02 use per patient  No blood thinners per patient  Pt denies issues with constipation  No A fib or A flutter  EMMI video sent to pt's e mail

## 2017-01-05 MED ORDER — VITAMIN D 1000 UNITS PO TABS: 1000.0000 [IU] | ORAL_TABLET | Freq: Every day | ORAL | 0 refills | Status: AC

## 2017-01-05 MED ORDER — SIMVASTATIN 20 MG PO TABS: 20.0000 mg | ORAL_TABLET | Freq: Every day | ORAL | 0 refills | Status: DC

## 2017-01-05 NOTE — Telephone Encounter (Signed)
Please Advise

## 2017-01-05 NOTE — Addendum Note (Signed)
Addended by: Sharon Seller B on: 01/05/2017 03:09 PM   Modules accepted: Orders

## 2017-01-05 NOTE — Telephone Encounter (Signed)
Labs done 1 month ago-- ok to refill for 6 months

## 2017-01-05 NOTE — Telephone Encounter (Signed)
Sent in as requested to [facility] the patient left a message sent in.

## 2017-01-17 ENCOUNTER — Ambulatory Visit (AMBULATORY_SURGERY_CENTER): Payer: Medicare Other | Admitting: Internal Medicine

## 2017-01-17 ENCOUNTER — Encounter: Payer: Self-pay | Admitting: Internal Medicine

## 2017-01-17 VITALS — BP 141/63 | HR 76 | Temp 97.1°F | Resp 13 | Ht 71.0 in | Wt 231.0 lb

## 2017-01-17 DIAGNOSIS — D123 Benign neoplasm of transverse colon: Secondary | ICD-10-CM | POA: Diagnosis not present

## 2017-01-17 DIAGNOSIS — D120 Benign neoplasm of cecum: Secondary | ICD-10-CM

## 2017-01-17 DIAGNOSIS — Z1211 Encounter for screening for malignant neoplasm of colon: Secondary | ICD-10-CM

## 2017-01-17 DIAGNOSIS — K635 Polyp of colon: Secondary | ICD-10-CM

## 2017-01-17 DIAGNOSIS — Z1212 Encounter for screening for malignant neoplasm of rectum: Secondary | ICD-10-CM | POA: Diagnosis not present

## 2017-01-17 MED ORDER — SODIUM CHLORIDE 0.9 % IV SOLN: 500.0000 mL | INTRAVENOUS | Status: DC

## 2017-01-17 NOTE — Patient Instructions (Signed)
YOU HAD AN ENDOSCOPIC PROCEDURE TODAY AT [facility]:   Refer to the procedure report that was given to you for any specific questions about what was found during the examination.  If the procedure report does not answer your questions, please call your gastroenterologist to clarify.  If you requested that your care partner not be given the details of your procedure findings, then the procedure report has been included in a sealed envelope for you to review at your convenience later.  YOU SHOULD EXPECT: Some feelings of bloating in the abdomen. Passage of more gas than usual.  Walking can help get rid of the air that was put into your GI tract during the procedure and reduce the bloating. If you had a lower endoscopy (such as a colonoscopy or flexible sigmoidoscopy) you may notice spotting of blood in your stool or on the toilet paper. If you underwent a bowel prep for your procedure, you may not have a normal bowel movement for a few days.  Please Note:  You might notice some irritation and congestion in your nose or some drainage.  This is from the oxygen used during your procedure.  There is no need for concern and it should clear up in a day or so.  SYMPTOMS TO REPORT IMMEDIATELY:   Following lower endoscopy (colonoscopy or flexible sigmoidoscopy):  Excessive amounts of blood in the stool  Significant tenderness or worsening of abdominal pains  Swelling of the abdomen that is new, acute  Fever of 100F or higher  For urgent or emergent issues, a gastroenterologist can be reached at any hour by calling [phone].   DIET:  We do recommend a small meal at first, but then you may proceed to your regular diet.  Drink plenty of fluids but you should avoid alcoholic beverages for 24 hours.  ACTIVITY:  You should plan to take it easy for the rest of today and you should NOT DRIVE or use heavy machinery until tomorrow (because of the sedation medicines used during the test).     FOLLOW UP: Our staff will call the number listed on your records the next business day following your procedure to check on you and address any questions or concerns that you may have regarding the information given to you following your procedure. If we do not reach you, we will leave a message.  However, if you are feeling well and you are not experiencing any problems, there is no need to return our call.  We will assume that you have returned to your regular daily activities without incident.  If any biopsies were taken you will be contacted by phone or by letter within the next 1-3 weeks.  Please call us at [phone] if you have not heard about the biopsies in 3 weeks.   Await for biopsy results to determine next repeat Colonoscopy screening Polyps (handout given) Hemorrhoids (handout given) Diverticulosis (handout given)  SIGNATURES/CONFIDENTIALITY: You and/or your care partner have signed paperwork which will be entered into your electronic medical record.  These signatures attest to the fact that that the information above on your After Visit Summary has been reviewed and is understood.  Full responsibility of the confidentiality of this discharge information lies with you and/or your care-partner.

## 2017-01-17 NOTE — Op Note (Signed)
[name] Procedure Date: 01/17/2017 11:29 AM MRN: [id number] Endoscopist: Docia Chuck. Henrene Pastor , MD Age: 66 Referring MD:  Date of Birth: 12/05/1950 Gender: Female Account #: [id number] Procedure:                Colonoscopy, with cold snare polypectomy x 2 Indications:              Screening for colorectal malignant neoplasm.                            Negative index exam 2008 ([place]) Medicines:                Monitored Anesthesia Care Procedure:                Pre-Anesthesia Assessment:                           - Prior to the procedure, a History and Physical                            was performed, and patient medications and                            allergies were reviewed. The patient's tolerance of                            previous anesthesia was also reviewed. The risks                            and benefits of the procedure and the sedation                            options and risks were discussed with the patient.                            All questions were answered, and informed consent                            was obtained. Prior Anticoagulants: The patient has                            taken no previous anticoagulant or antiplatelet                            agents. ASA Grade Assessment: II - A patient with                            mild systemic disease. After reviewing the risks                            and benefits, the patient was deemed in                            satisfactory condition to undergo the procedure.  After obtaining informed consent, the colonoscope                            was passed under direct vision. Throughout the                            procedure, the patient's blood pressure, pulse, and                            oxygen saturations were monitored continuously. The                            Model CF-HQ190L [id number]) scope was introduced   through the anus and advanced to the the cecum,                            identified by appendiceal orifice and ileocecal                            valve. The ileocecal valve, appendiceal orifice,                            and rectum were photographed. The quality of the                            bowel preparation was excellent. The colonoscopy                            was performed without difficulty. The patient                            tolerated the procedure well. The bowel preparation                            used was SUPREP. Scope In: 11:33:44 AM Scope Out: 11:47:04 AM Scope Withdrawal Time: 0 hours 11 minutes 37 seconds  Total Procedure Duration: 0 hours 13 minutes 20 seconds  Findings:                 Two polyps were found in the transverse colon and                            cecum. The polyps were 1 to 3 mm in size. These                            polyps were removed with a cold snare. Resection                            and retrieval were complete.                           A few diverticula were found in the sigmoid colon.  Internal hemorrhoids were found during retroflexion.                           The exam was otherwise without abnormality on                            direct and retroflexion views. Complications:            No immediate complications. Estimated blood loss:                            None. Estimated Blood Loss:     Estimated blood loss: none. Impression:               - Two 1 to 3 mm polyps in the transverse colon and                            in the cecum, removed with a cold snare. Resected                            and retrieved.                           - Diverticulosis in the sigmoid colon.                           - Internal hemorrhoids.                           - The examination was otherwise normal on direct                            and retroflexion views. Recommendation:           - Repeat colonoscopy  in 5-10 years for surveillance.                           - Patient has a contact number available for                            emergencies. The signs and symptoms of potential                            delayed complications were discussed with the                            patient. Return to normal activities tomorrow.                            Written discharge instructions were provided to the                            patient.                           - Resume previous diet.                           -  Continue present medications.                           - Await pathology results. Docia Chuck. Henrene Pastor, MD 01/17/2017 11:51:54 AM This report has been signed electronically.

## 2017-01-17 NOTE — Progress Notes (Signed)
Spontaneous respirations throughout. VSS. Resting comfortably. To PACU on room air. Report to  RN. 

## 2017-01-17 NOTE — Progress Notes (Signed)
Called to room to assist during endoscopic procedure.  Patient ID and intended procedure confirmed with present staff. Received instructions for my participation in the procedure from the performing physician.  

## 2017-01-17 NOTE — Progress Notes (Signed)
Pt's states no medical or surgical changes since previsit or office visit. 

## 2017-01-18 ENCOUNTER — Telehealth: Payer: Self-pay | Admitting: *Deleted

## 2017-01-18 NOTE — Telephone Encounter (Signed)
  Follow up Call-  Call back number 01/17/2017  Post procedure Call Back phone  # [phone]  Permission to leave phone message Yes  Some recent data might be hidden     Patient questions:  Do you have a fever, pain , or abdominal swelling? No. Pain Score  0 *  Have you tolerated food without any problems? Yes.    Have you been able to return to your normal activities? Yes.    Do you have any questions about your discharge instructions: Diet   No. Medications  No. Follow up visit  No.  Do you have questions or concerns about your Care? No.  Actions: * If pain score is 4 or above: No action needed, pain <4.

## 2017-01-19 ENCOUNTER — Encounter: Payer: Self-pay | Admitting: Internal Medicine

## 2017-01-25 ENCOUNTER — Telehealth: Payer: Self-pay | Admitting: Family Medicine

## 2017-01-25 DIAGNOSIS — J301 Allergic rhinitis due to pollen: Secondary | ICD-10-CM

## 2017-01-25 NOTE — Telephone Encounter (Signed)
Pt request MONTELUKAST 10 MG sent to [facility]. Pt request we call [phone] to Expedite prescription being mailed.  Also: Vitamin D 50,000 Units is not sold over the counter. Pt ins will not pay for it either. [facility] [place] and she will pay out of pocket unless there is another way to take that much across the counter.

## 2017-01-26 MED ORDER — MONTELUKAST SODIUM 10 MG PO TABS: 10.0000 mg | ORAL_TABLET | Freq: Every day | ORAL | 1 refills | Status: DC

## 2017-01-26 NOTE — Telephone Encounter (Signed)
Get d3  otc  2000 u daily

## 2017-01-26 NOTE — Telephone Encounter (Signed)
Patient ins will not cover vit d 50000 units.  Any other recommendations of what she can get over the counter?  rx for singular sent to pharmacy.

## 2017-01-30 NOTE — Telephone Encounter (Signed)
Patient notified

## 2017-02-09 DIAGNOSIS — Z1231 Encounter for screening mammogram for malignant neoplasm of breast: Secondary | ICD-10-CM | POA: Diagnosis not present

## 2017-02-09 LAB — HM MAMMOGRAPHY

## 2017-02-24 ENCOUNTER — Other Ambulatory Visit: Payer: 59

## 2017-03-29 ENCOUNTER — Encounter: Payer: Self-pay | Admitting: *Deleted

## 2017-04-28 ENCOUNTER — Other Ambulatory Visit: Payer: Self-pay | Admitting: Family Medicine

## 2017-04-28 DIAGNOSIS — E785 Hyperlipidemia, unspecified: Secondary | ICD-10-CM

## 2017-07-04 ENCOUNTER — Other Ambulatory Visit (INDEPENDENT_AMBULATORY_CARE_PROVIDER_SITE_OTHER): Payer: Medicare Other

## 2017-07-04 ENCOUNTER — Other Ambulatory Visit: Payer: Self-pay | Admitting: Emergency Medicine

## 2017-07-04 DIAGNOSIS — E785 Hyperlipidemia, unspecified: Secondary | ICD-10-CM | POA: Diagnosis not present

## 2017-07-04 LAB — COMPREHENSIVE METABOLIC PANEL
ALBUMIN: 3.9 g/dL (ref 3.5–5.2)
ALT: 28 U/L (ref 0–35)
AST: 25 U/L (ref 0–37)
Alkaline Phosphatase: 84 U/L (ref 39–117)
BILIRUBIN TOTAL: 0.4 mg/dL (ref 0.2–1.2)
BUN: 15 mg/dL (ref 6–23)
CALCIUM: 9.2 mg/dL (ref 8.4–10.5)
CHLORIDE: 106 meq/L (ref 96–112)
CO2: 29 meq/L (ref 19–32)
Creatinine, Ser: 0.79 mg/dL (ref 0.40–1.20)
GFR: 77.47 mL/min (ref >60.00)
Glucose, Bld: 113 mg/dL — ABNORMAL HIGH (ref 70–99)
Potassium: 4.5 mEq/L (ref 3.5–5.1)
Sodium: 139 mEq/L (ref 135–145)
Total Protein: 6.6 g/dL (ref 6.0–8.3)

## 2017-07-04 LAB — LIPID PANEL
CHOLESTEROL: 161 mg/dL (ref 0–200)
HDL: 58.2 mg/dL (ref >39.00)
LDL Cholesterol: 77 mg/dL (ref 0–99)
NonHDL: 102.85
TRIGLYCERIDES: 129 mg/dL (ref 0.0–149.0)
Total CHOL/HDL Ratio: 3
VLDL: 25.8 mg/dL (ref 0.0–40.0)

## 2017-07-05 ENCOUNTER — Other Ambulatory Visit: Payer: Self-pay

## 2017-07-05 DIAGNOSIS — E7849 Other hyperlipidemia: Secondary | ICD-10-CM

## 2017-07-05 DIAGNOSIS — E785 Hyperlipidemia, unspecified: Secondary | ICD-10-CM

## 2017-07-05 DIAGNOSIS — R739 Hyperglycemia, unspecified: Secondary | ICD-10-CM

## 2017-07-11 ENCOUNTER — Other Ambulatory Visit: Payer: Self-pay

## 2017-07-11 DIAGNOSIS — E559 Vitamin D deficiency, unspecified: Secondary | ICD-10-CM

## 2017-07-13 ENCOUNTER — Other Ambulatory Visit (INDEPENDENT_AMBULATORY_CARE_PROVIDER_SITE_OTHER): Payer: Medicare Other

## 2017-07-13 DIAGNOSIS — E559 Vitamin D deficiency, unspecified: Secondary | ICD-10-CM | POA: Diagnosis not present

## 2017-07-13 LAB — VITAMIN D 25 HYDROXY (VIT D DEFICIENCY, FRACTURES): VITD: 57.72 ng/mL (ref 30.00–100.00)

## 2017-07-14 ENCOUNTER — Encounter: Payer: Self-pay | Admitting: *Deleted

## 2017-07-16 ENCOUNTER — Other Ambulatory Visit: Payer: Self-pay | Admitting: Family Medicine

## 2017-07-16 DIAGNOSIS — J301 Allergic rhinitis due to pollen: Secondary | ICD-10-CM

## 2017-07-19 ENCOUNTER — Ambulatory Visit (INDEPENDENT_AMBULATORY_CARE_PROVIDER_SITE_OTHER): Payer: Medicare Other | Admitting: *Deleted

## 2017-07-19 DIAGNOSIS — Z23 Encounter for immunization: Secondary | ICD-10-CM | POA: Diagnosis not present

## 2017-07-19 NOTE — Progress Notes (Signed)
Noted. Agree with above.  [place], DO 07/19/17 5:05 PM

## 2017-07-19 NOTE — Progress Notes (Signed)
Pt originally came in for Shingrix vaccine. Pt currently has Medicare and vaccine is not covered in our office. Pt will need to go to local pharmacy due to insurance coverage. Pt voices understanding and requests Pneumonia vaccine today. Pt previously had pneumovax in 2011 but has not had Prevnar yet.  Prevnar (pneumococcal 13) given IM, left deltoid and pt tolerated injection well.   Note will be routed to Dr Nani Ravens in PCP's absence.

## 2017-09-12 ENCOUNTER — Other Ambulatory Visit: Payer: Self-pay | Admitting: Family Medicine

## 2017-09-12 DIAGNOSIS — E785 Hyperlipidemia, unspecified: Secondary | ICD-10-CM

## 2017-10-04 ENCOUNTER — Other Ambulatory Visit: Payer: Self-pay | Admitting: Family Medicine

## 2017-10-04 DIAGNOSIS — J301 Allergic rhinitis due to pollen: Secondary | ICD-10-CM

## 2017-11-27 ENCOUNTER — Encounter: Payer: Self-pay | Admitting: Family Medicine

## 2017-11-27 ENCOUNTER — Ambulatory Visit (INDEPENDENT_AMBULATORY_CARE_PROVIDER_SITE_OTHER): Payer: Medicare Other | Admitting: Family Medicine

## 2017-11-27 VITALS — BP 127/58 | HR 91 | Temp 98.2°F | Resp 16 | Ht 71.0 in | Wt 229.4 lb

## 2017-11-27 DIAGNOSIS — E785 Hyperlipidemia, unspecified: Secondary | ICD-10-CM | POA: Diagnosis not present

## 2017-11-27 DIAGNOSIS — R739 Hyperglycemia, unspecified: Secondary | ICD-10-CM | POA: Diagnosis not present

## 2017-11-27 DIAGNOSIS — J301 Allergic rhinitis due to pollen: Secondary | ICD-10-CM | POA: Diagnosis not present

## 2017-11-27 DIAGNOSIS — Z23 Encounter for immunization: Secondary | ICD-10-CM

## 2017-11-27 LAB — COMPREHENSIVE METABOLIC PANEL
ALT: 29 U/L (ref 0–35)
AST: 20 U/L (ref 0–37)
Albumin: 4.5 g/dL (ref 3.5–5.2)
Alkaline Phosphatase: 102 U/L (ref 39–117)
BUN: 21 mg/dL (ref 6–23)
CHLORIDE: 103 meq/L (ref 96–112)
CO2: 29 meq/L (ref 19–32)
CREATININE: 0.9 mg/dL (ref 0.40–1.20)
Calcium: 9.8 mg/dL (ref 8.4–10.5)
GFR: 66.57 mL/min (ref >60.00)
GLUCOSE: 123 mg/dL — AB (ref 70–99)
POTASSIUM: 4.3 meq/L (ref 3.5–5.1)
SODIUM: 139 meq/L (ref 135–145)
Total Bilirubin: 0.6 mg/dL (ref 0.2–1.2)
Total Protein: 7.2 g/dL (ref 6.0–8.3)

## 2017-11-27 LAB — LIPID PANEL
CHOL/HDL RATIO: 4
Cholesterol: 184 mg/dL (ref 0–200)
HDL: 50.2 mg/dL (ref >39.00)
NonHDL: 134.17
Triglycerides: 219 mg/dL — ABNORMAL HIGH (ref 0.0–149.0)
VLDL: 43.8 mg/dL — ABNORMAL HIGH (ref 0.0–40.0)

## 2017-11-27 LAB — HEMOGLOBIN A1C: HEMOGLOBIN A1C: 6.9 % — AB (ref 4.6–6.5)

## 2017-11-27 LAB — LDL CHOLESTEROL, DIRECT: LDL DIRECT: 111 mg/dL

## 2017-11-27 MED ORDER — SIMVASTATIN 20 MG PO TABS: 20.0000 mg | ORAL_TABLET | Freq: Every day | ORAL | 0 refills | Status: DC

## 2017-11-27 MED ORDER — MONTELUKAST SODIUM 10 MG PO TABS: 10.0000 mg | ORAL_TABLET | Freq: Every day | ORAL | 0 refills | Status: DC

## 2017-11-27 NOTE — Assessment & Plan Note (Signed)
con't singulair  Use antihistamine and flonase

## 2017-11-27 NOTE — Assessment & Plan Note (Signed)
Tolerating statin, encouraged heart healthy diet, avoid trans fats, minimize simple carbs and saturated fats. Increase exercise as tolerated 

## 2017-11-27 NOTE — Progress Notes (Signed)
Patient ID: Barbara Thomas, female    DOB: [birth year]  Age: 67 y.o. MRN: [id number]    Subjective:  Subjective  HPI Barbara Thomas presents for chol, hyperglycemia and she is having trouble with allergies.  The singulair by itself is not working    No other complaints  Review of Systems  Constitutional: Negative for chills and fever.  HENT: Positive for congestion and rhinorrhea. Negative for hearing loss, sinus pressure and sinus pain.   Eyes: Negative for discharge.  Respiratory: Negative for cough and shortness of breath.   Cardiovascular: Negative for chest pain, palpitations and leg swelling.  Gastrointestinal: Negative for abdominal pain, blood in stool, constipation, diarrhea, nausea and vomiting.  Genitourinary: Negative for dysuria, frequency, hematuria and urgency.  Musculoskeletal: Negative for back pain and myalgias.  Skin: Negative for rash.  Allergic/Immunologic: Negative for environmental allergies.  Neurological: Negative for dizziness, weakness and headaches.  Hematological: Does not bruise/bleed easily.  Psychiatric/Behavioral: Negative for suicidal ideas. The patient is not nervous/anxious.     History Past Medical History:  Diagnosis Date  . Allergy   . Arthritis   . Blood transfusion without reported diagnosis    1987  . Cancer (HCC)    Basal Cell Carcinoma  . Cataract   . Chronic kidney disease    kidney stone once  . H/O hiatal hernia   . History of basal cell carcinoma excision    NOSE  . History of benign bladder tumor   . History of DVT of lower extremity    11/ 2011  BILATERAL  POST FOOT SURGERY  . History of pulmonary embolus (PE)    12/ 2011   POST FOOT SURGERY  . Hyperlipidemia   . Left ureteral calculus   . Migraines   . OSA on CPAP    STUDY DONE 2012  . PONV (postoperative nausea and vomiting)    severe  . Sigmoid diverticulosis   . Sleep apnea    CPAP  . Wears glasses     She has a past surgical history that includes Hip pinning  (Left, 1985); BENIGN RIGHT BREAST BX (12-06-2010); Insertion of vena cava filter (12/ 2011); ORIF LEFT ANKLE FX (11/ 2011); Partial hip arthroplasty (Left, 1987); REVISION HIP HEMIARTHROPLASTY  (Left, 2004); Cystoscopy with retrograde pyelogram, ureteroscopy and stent placement (Left, 11/25/2013); Holmium laser application (Left, 9/37/9024); Vaginal hysterectomy (2005); Colonoscopy; and Incontinence surgery.   Her family history includes Cancer in her father; Dementia in her mother; Diabetes in her brother; Hypertension in her father and mother; Stroke in her paternal grandmother; Sudden death in her maternal grandmother; Transient ischemic attack in her mother.She reports that she has never smoked. She has never used smokeless tobacco. She reports that she does not drink alcohol or use drugs.  Current Outpatient Medications on File Prior to Visit  Medication Sig Dispense Refill  . aspirin 81 MG chewable tablet Chew 162 mg by mouth daily.    Marland Kitchen CALCIUM PO Take by mouth.    . cholecalciferol (VITAMIN D) 1000 units tablet Take 1 tablet (1,000 Units total) by mouth daily. 90 tablet 0  . co-enzyme Q-10 30 MG capsule Take 30 mg by mouth daily.    Marland Kitchen glucosamine-chondroitin 500-400 MG tablet Take 1 tablet by mouth 3 (three) times daily.    . Multiple Vitamin (MULTIVITAMIN) tablet Take 1 tablet by mouth daily.      . Omega-3 Fatty Acids (FISH OIL PO) Take by mouth.     No current facility-administered medications on file  prior to visit.      Objective:  Objective  Physical Exam  Constitutional: She is oriented to person, place, and time. She appears well-developed and well-nourished.  HENT:  Head: Normocephalic and atraumatic.  Eyes: Conjunctivae and EOM are normal.  Neck: Normal range of motion. Neck supple. No JVD present. Carotid bruit is not present. No thyromegaly present.  Cardiovascular: Normal rate, regular rhythm and normal heart sounds.  No murmur heard. Pulmonary/Chest: Effort normal and  breath sounds normal. No respiratory distress. She has no wheezes. She has no rales. She exhibits no tenderness.  Musculoskeletal: She exhibits no edema.  Neurological: She is alert and oriented to person, place, and time.  Psychiatric: She has a normal mood and affect.  Nursing note and vitals reviewed.  BP (!) 127/58 (BP Location: Right Arm, Cuff Size: Large)   Pulse 91   Temp 98.2 F (36.8 C) (Oral)   Resp 16   Ht 5\' 11"  (1.803 m)   Wt 229 lb 6.4 oz (104.1 kg)   SpO2 99%   BMI 31.99 kg/m  Wt Readings from Last 3 Encounters:  11/27/17 229 lb 6.4 oz (104.1 kg)  01/17/17 231 lb (104.8 kg)  01/03/17 231 lb 3.2 oz (104.9 kg)     Lab Results  Component Value Date   WBC 7.1 08/25/2016   HGB 14.3 08/25/2016   HCT 43.8 08/25/2016   PLT 253.0 08/25/2016   GLUCOSE 113 (H) 07/04/2017   CHOL 161 07/04/2017   TRIG 129.0 07/04/2017   HDL 58.20 07/04/2017   LDLDIRECT 199.0 02/05/2013   LDLCALC 77 07/04/2017   ALT 28 07/04/2017   AST 25 07/04/2017   NA 139 07/04/2017   K 4.5 07/04/2017   CL 106 07/04/2017   CREATININE 0.79 07/04/2017   BUN 15 07/04/2017   CO2 29 07/04/2017   TSH 1.98 08/25/2015   INR 0.95 11/21/2013   HGBA1C 6.4 02/16/2016    No results found.   Assessment & Plan:  Plan  I have changed Barbara Thomas "Barbara Thomas"'s simvastatin. I am also having her maintain her multivitamin, aspirin, co-enzyme Q-10, CALCIUM PO, Omega-3 Fatty Acids (FISH OIL PO), glucosamine-chondroitin, cholecalciferol, and montelukast.  Meds ordered this encounter  Medications  . simvastatin (ZOCOR) 20 MG tablet    Sig: Take 1 tablet (20 mg total) by mouth at bedtime.    Dispense:  90 tablet    Refill:  0  . montelukast (SINGULAIR) 10 MG tablet    Sig: Take 1 tablet (10 mg total) by mouth at bedtime.    Dispense:  90 tablet    Refill:  0    Problem List Items Addressed This Visit      Unprioritized   Hyperglycemia - Primary   Relevant Orders   Comprehensive metabolic panel    Hemoglobin A1c   Hyperlipidemia LDL goal <100    Tolerating statin, encouraged heart healthy diet, avoid trans fats, minimize simple carbs and saturated fats. Increase exercise as tolerated      Relevant Medications   simvastatin (ZOCOR) 20 MG tablet   Other Relevant Orders   Comprehensive metabolic panel   Lipid panel   Hemoglobin A1c   Seasonal allergic rhinitis due to pollen    con't singulair  Use antihistamine and flonase       Relevant Medications   montelukast (SINGULAIR) 10 MG tablet    Other Visit Diagnoses    Influenza vaccine administered       Relevant Orders   Flu vaccine HIGH  DOSE PF (Fluzone High Dose) (Completed)      Follow-up: Return in about 6 months (around 05/28/2018), or if symptoms worsen or fail to improve, for hyperlipidemia.  Ann Held, DO

## 2017-11-27 NOTE — Patient Instructions (Signed)

## 2017-12-01 ENCOUNTER — Telehealth: Payer: Self-pay

## 2017-12-01 ENCOUNTER — Other Ambulatory Visit: Payer: Self-pay | Admitting: Family Medicine

## 2017-12-01 DIAGNOSIS — E785 Hyperlipidemia, unspecified: Secondary | ICD-10-CM

## 2017-12-01 DIAGNOSIS — E782 Mixed hyperlipidemia: Secondary | ICD-10-CM

## 2017-12-01 MED ORDER — SIMVASTATIN 40 MG PO TABS: 40.0000 mg | ORAL_TABLET | Freq: Every day | ORAL | 1 refills | Status: DC

## 2017-12-01 NOTE — Telephone Encounter (Signed)
Pt. Returned call and Chief Strategy Officer relayed Dr. Nonda Lou message. Zocor dosage increased to 40mg  per Dr. Etter Sjogren and sent via mail order. Lab appointment made for 12/10.

## 2017-12-11 ENCOUNTER — Other Ambulatory Visit: Payer: Self-pay | Admitting: Family Medicine

## 2017-12-11 DIAGNOSIS — E785 Hyperlipidemia, unspecified: Secondary | ICD-10-CM

## 2018-01-02 ENCOUNTER — Other Ambulatory Visit: Payer: Medicare Other

## 2018-02-15 DIAGNOSIS — Z1231 Encounter for screening mammogram for malignant neoplasm of breast: Secondary | ICD-10-CM | POA: Diagnosis not present

## 2018-02-15 LAB — HM MAMMOGRAPHY

## 2018-02-20 ENCOUNTER — Other Ambulatory Visit (INDEPENDENT_AMBULATORY_CARE_PROVIDER_SITE_OTHER): Payer: Medicare Other

## 2018-02-20 DIAGNOSIS — E782 Mixed hyperlipidemia: Secondary | ICD-10-CM

## 2018-02-20 LAB — COMPREHENSIVE METABOLIC PANEL
ALT: 32 U/L (ref 0–35)
AST: 23 U/L (ref 0–37)
Albumin: 4.4 g/dL (ref 3.5–5.2)
Alkaline Phosphatase: 102 U/L (ref 39–117)
BUN: 16 mg/dL (ref 6–23)
CO2: 27 mEq/L (ref 19–32)
Calcium: 9.3 mg/dL (ref 8.4–10.5)
Chloride: 105 mEq/L (ref 96–112)
Creatinine, Ser: 0.8 mg/dL (ref 0.40–1.20)
GFR: 76.21 mL/min (ref >60.00)
Glucose, Bld: 129 mg/dL — ABNORMAL HIGH (ref 70–99)
Potassium: 4.2 mEq/L (ref 3.5–5.1)
Sodium: 138 mEq/L (ref 135–145)
Total Bilirubin: 0.6 mg/dL (ref 0.2–1.2)
Total Protein: 6.9 g/dL (ref 6.0–8.3)

## 2018-02-20 LAB — LIPID PANEL
CHOLESTEROL: 147 mg/dL (ref 0–200)
HDL: 50.8 mg/dL (ref >39.00)
LDL Cholesterol: 65 mg/dL (ref 0–99)
NonHDL: 96.08
TRIGLYCERIDES: 156 mg/dL — AB (ref 0.0–149.0)
Total CHOL/HDL Ratio: 3
VLDL: 31.2 mg/dL (ref 0.0–40.0)

## 2018-03-13 ENCOUNTER — Encounter: Payer: Self-pay | Admitting: *Deleted

## 2018-03-23 ENCOUNTER — Other Ambulatory Visit: Payer: Self-pay | Admitting: Family Medicine

## 2018-03-23 DIAGNOSIS — J301 Allergic rhinitis due to pollen: Secondary | ICD-10-CM

## 2018-03-23 MED ORDER — MONTELUKAST SODIUM 10 MG PO TABS: 10.0000 mg | ORAL_TABLET | Freq: Every day | ORAL | 0 refills | Status: DC

## 2018-03-23 NOTE — Telephone Encounter (Signed)
Requested Prescriptions  Pending Prescriptions Disp Refills  . montelukast (SINGULAIR) 10 MG tablet 90 tablet 0    Sig: Take 1 tablet (10 mg total) by mouth at bedtime.     Pulmonology:  Leukotriene Inhibitors Passed - 03/23/2018  1:05 PM      Passed - Valid encounter within last 12 months    Recent Outpatient Visits          3 months ago Hyperglycemia   [facility] [place], [place]   1 year ago Acute suppurative otitis media of both ears without spontaneous rupture of tympanic membranes, recurrence not specified   [facility], [place], DO   1 year ago Preventative health care   [facility] [place], [place]   2 years ago Hyperlipidemia LDL goal <100   [facility] [place], DO   2 years ago Preventative health care   [facility] [place], DO      Future Appointments            In 2 months Vevelyn Royals, Parthenia Ames, [place] at [facility], [place]   In 2 months [place], [place] at [facility], [place]

## 2018-03-23 NOTE — Telephone Encounter (Signed)
Copied from [place] [id number]. Topic: Quick Communication - Rx Refill/Question >> Mar 23, 2018 12:56 PM Rayann Heman wrote: Medication: montelukast (SINGULAIR) 10 MG tablet [[id number]]  needs 90 day supply   Has the patient contacted their pharmacy? yes Preferred Pharmacy (with phone number or street name):[facility] [place] [facility] [phone] (Phone) [phone] (Fax)  Agent: Please be advised that RX refills may take up to 3 business days. We ask that you follow-up with your pharmacy.

## 2018-03-27 NOTE — Telephone Encounter (Signed)
This encounter was created in error - please disregard.

## 2018-05-29 ENCOUNTER — Ambulatory Visit: Payer: Medicare Other | Admitting: Family Medicine

## 2018-05-29 ENCOUNTER — Ambulatory Visit: Payer: Medicare Other | Admitting: *Deleted

## 2018-06-15 ENCOUNTER — Other Ambulatory Visit: Payer: Self-pay | Admitting: Family Medicine

## 2018-06-15 DIAGNOSIS — E785 Hyperlipidemia, unspecified: Secondary | ICD-10-CM

## 2018-06-19 ENCOUNTER — Other Ambulatory Visit: Payer: Self-pay | Admitting: *Deleted

## 2018-06-19 DIAGNOSIS — J301 Allergic rhinitis due to pollen: Secondary | ICD-10-CM

## 2018-06-19 MED ORDER — MONTELUKAST SODIUM 10 MG PO TABS: 10.0000 mg | ORAL_TABLET | Freq: Every day | ORAL | 0 refills | Status: DC

## 2018-06-20 ENCOUNTER — Ambulatory Visit (INDEPENDENT_AMBULATORY_CARE_PROVIDER_SITE_OTHER): Payer: Medicare Other | Admitting: Family Medicine

## 2018-06-20 ENCOUNTER — Encounter: Payer: Self-pay | Admitting: Family Medicine

## 2018-06-20 ENCOUNTER — Other Ambulatory Visit: Payer: Self-pay

## 2018-06-20 DIAGNOSIS — J301 Allergic rhinitis due to pollen: Secondary | ICD-10-CM

## 2018-06-20 DIAGNOSIS — E785 Hyperlipidemia, unspecified: Secondary | ICD-10-CM

## 2018-06-20 MED ORDER — MONTELUKAST SODIUM 10 MG PO TABS: 10.0000 mg | ORAL_TABLET | Freq: Every day | ORAL | 3 refills | Status: DC

## 2018-06-20 MED ORDER — SIMVASTATIN 40 MG PO TABS: 40.0000 mg | ORAL_TABLET | Freq: Every day | ORAL | 1 refills | Status: DC

## 2018-06-20 NOTE — Progress Notes (Signed)
Virtual Visit via Video Note  I connected with Detta Mellin on 06/20/18 at  1:00 PM EDT by a video enabled telemedicine application and verified that I am speaking with the correct person using two identifiers.   I discussed the limitations of evaluation and management by telemedicine and the availability of in person appointments. The patient expressed understanding and agreed to proceed.  History of Present Illness: Pt at home needing refills   No complaints Pt with hx inc cholesterol  pt with no sob, no cp, no fevers , no congestion / cough    [facility]  Observations/Objective: Afebrile,   RR normal Wt 228  Ht 5 ft  11in  Pt in NAD  Assessment and Plan: 1. Seasonal allergic rhinitis due to pollen Controlled  con't meds - montelukast (SINGULAIR) 10 MG tablet; Take 1 tablet (10 mg total) by mouth at bedtime.  Dispense: 90 tablet; Refill: 3  2. Hyperlipidemia LDL goal <100 Tolerating statin, encouraged heart healthy diet, avoid trans fats, minimize simple carbs and saturated fats. Increase exercise as tolerated - simvastatin (ZOCOR) 40 MG tablet; Take 1 tablet (40 mg total) by mouth at bedtime.  Dispense: 90 tablet; Refill: 1 - Lipid panel; Future - Comprehensive metabolic panel; Future   Follow Up Instructions:    I discussed the assessment and treatment plan with the patient. The patient was provided an opportunity to ask questions and all were answered. The patient agreed with the plan and demonstrated an understanding of the instructions.   The patient was advised to call back or seek an in-person evaluation if the symptoms worsen or if the condition fails to improve as anticipated.  I provided 25 minutes of non-face-to-face time during this encounter.-- > 50 % discussing covid,  Stressors at home , meds and getting labs    Ann Held, DO

## 2018-06-21 ENCOUNTER — Other Ambulatory Visit: Payer: Self-pay

## 2018-06-21 ENCOUNTER — Other Ambulatory Visit (INDEPENDENT_AMBULATORY_CARE_PROVIDER_SITE_OTHER): Payer: Medicare Other

## 2018-06-21 ENCOUNTER — Other Ambulatory Visit: Payer: Self-pay | Admitting: Family Medicine

## 2018-06-21 DIAGNOSIS — R739 Hyperglycemia, unspecified: Secondary | ICD-10-CM

## 2018-06-21 DIAGNOSIS — E785 Hyperlipidemia, unspecified: Secondary | ICD-10-CM

## 2018-06-21 DIAGNOSIS — E1169 Type 2 diabetes mellitus with other specified complication: Secondary | ICD-10-CM

## 2018-06-21 DIAGNOSIS — E1165 Type 2 diabetes mellitus with hyperglycemia: Secondary | ICD-10-CM

## 2018-06-21 LAB — COMPREHENSIVE METABOLIC PANEL
ALT: 21 U/L (ref 0–35)
AST: 17 U/L (ref 0–37)
Albumin: 4.2 g/dL (ref 3.5–5.2)
Alkaline Phosphatase: 81 U/L (ref 39–117)
BUN: 17 mg/dL (ref 6–23)
CO2: 26 mEq/L (ref 19–32)
Calcium: 9.5 mg/dL (ref 8.4–10.5)
Chloride: 104 mEq/L (ref 96–112)
Creatinine, Ser: 0.89 mg/dL (ref 0.40–1.20)
GFR: 63.34 mL/min (ref >60.00)
Glucose, Bld: 121 mg/dL — ABNORMAL HIGH (ref 70–99)
Potassium: 4.4 mEq/L (ref 3.5–5.1)
Sodium: 139 mEq/L (ref 135–145)
Total Bilirubin: 0.6 mg/dL (ref 0.2–1.2)
Total Protein: 6.6 g/dL (ref 6.0–8.3)

## 2018-06-21 LAB — LIPID PANEL
Cholesterol: 163 mg/dL (ref 0–200)
HDL: 53.3 mg/dL (ref >39.00)
LDL Cholesterol: 79 mg/dL (ref 0–99)
NonHDL: 109.62
Total CHOL/HDL Ratio: 3
Triglycerides: 151 mg/dL — ABNORMAL HIGH (ref 0.0–149.0)
VLDL: 30.2 mg/dL (ref 0.0–40.0)

## 2018-06-21 LAB — HEMOGLOBIN A1C: Hgb A1c MFr Bld: 7.2 % — ABNORMAL HIGH (ref 4.6–6.5)

## 2018-06-26 ENCOUNTER — Telehealth: Payer: Self-pay | Admitting: *Deleted

## 2018-06-26 NOTE — Telephone Encounter (Signed)
Lowne would like for her to come within in 2 weeks from 06/20/18 for labs.

## 2018-06-26 NOTE — Telephone Encounter (Signed)
-----   Message from Ann Held, DO sent at 06/20/2018  1:08 PM EDT ----- Pt needs labs

## 2018-06-26 NOTE — Telephone Encounter (Signed)
Labs were drawn on 06-21-18

## 2018-12-19 DIAGNOSIS — Z23 Encounter for immunization: Secondary | ICD-10-CM | POA: Diagnosis not present

## 2019-02-19 ENCOUNTER — Other Ambulatory Visit: Payer: Self-pay | Admitting: Family Medicine

## 2019-02-19 DIAGNOSIS — E785 Hyperlipidemia, unspecified: Secondary | ICD-10-CM

## 2019-02-21 DIAGNOSIS — Z1231 Encounter for screening mammogram for malignant neoplasm of breast: Secondary | ICD-10-CM | POA: Diagnosis not present

## 2019-02-21 LAB — HM MAMMOGRAPHY

## 2019-03-19 ENCOUNTER — Other Ambulatory Visit: Payer: Self-pay | Admitting: Family Medicine

## 2019-03-19 DIAGNOSIS — E785 Hyperlipidemia, unspecified: Secondary | ICD-10-CM

## 2019-05-12 ENCOUNTER — Other Ambulatory Visit: Payer: Self-pay | Admitting: Family Medicine

## 2019-05-12 DIAGNOSIS — E785 Hyperlipidemia, unspecified: Secondary | ICD-10-CM

## 2019-05-16 ENCOUNTER — Ambulatory Visit: Payer: Medicare Other | Attending: Internal Medicine

## 2019-05-16 ENCOUNTER — Ambulatory Visit: Payer: Medicare Other

## 2019-05-16 DIAGNOSIS — Z23 Encounter for immunization: Secondary | ICD-10-CM | POA: Insufficient documentation

## 2019-05-16 NOTE — Progress Notes (Signed)
   Covid-19 Vaccination Clinic  Name:  Colena Scheele    MRN: [id number] DOB: 30-Jan-1951  05/16/2019  Ms. Disbro was observed post Covid-19 immunization for 15 minutes without incident. She was provided with Vaccine Information Sheet and instruction to access the V-Safe system.   Ms. Grumbling was instructed to call 911 with any severe reactions post vaccine: Marland Kitchen Difficulty breathing  . Swelling of face and throat  . A fast heartbeat  . A bad rash all over body  . Dizziness and weakness   Immunizations Administered    Name Date Dose VIS Date Route   Moderna COVID-19 Vaccine 05/16/2019 11:18 AM 0.5 mL 02/12/2019 Intramuscular   Manufacturer: Moderna   Lot: [id number]   [id number]

## 2019-06-11 ENCOUNTER — Other Ambulatory Visit: Payer: Self-pay | Admitting: Family Medicine

## 2019-06-11 DIAGNOSIS — J301 Allergic rhinitis due to pollen: Secondary | ICD-10-CM

## 2019-06-11 DIAGNOSIS — E785 Hyperlipidemia, unspecified: Secondary | ICD-10-CM

## 2019-06-18 ENCOUNTER — Ambulatory Visit: Payer: Medicare Other | Attending: Internal Medicine

## 2019-06-18 DIAGNOSIS — Z23 Encounter for immunization: Secondary | ICD-10-CM

## 2019-06-18 NOTE — Progress Notes (Signed)
   Covid-19 Vaccination Clinic  Name:  Avryl Sinibaldi    MRN: [id number] DOB: [birth year]  06/18/2019  Ms. Oboyle was observed post Covid-19 immunization for 15 minutes without incident. She was provided with Vaccine Information Sheet and instruction to access the V-Safe system.   Ms. Dicke was instructed to call 911 with any severe reactions post vaccine: Marland Kitchen Difficulty breathing  . Swelling of face and throat  . A fast heartbeat  . A bad rash all over body  . Dizziness and weakness   Immunizations Administered    Name Date Dose VIS Date Route   Moderna COVID-19 Vaccine 06/18/2019 10:49 AM 0.5 mL 02/12/2019 Intramuscular   Manufacturer: Moderna   [id number]   [place]BE:[id number]

## 2019-07-07 ENCOUNTER — Other Ambulatory Visit: Payer: Self-pay

## 2019-07-07 ENCOUNTER — Emergency Department (HOSPITAL_COMMUNITY): Payer: Medicare Other

## 2019-07-07 ENCOUNTER — Emergency Department (HOSPITAL_BASED_OUTPATIENT_CLINIC_OR_DEPARTMENT_OTHER): Payer: Medicare Other

## 2019-07-07 ENCOUNTER — Encounter (HOSPITAL_COMMUNITY): Payer: Self-pay | Admitting: Emergency Medicine

## 2019-07-07 ENCOUNTER — Inpatient Hospital Stay (HOSPITAL_COMMUNITY)
Admission: EM | Admit: 2019-07-07 | Discharge: 2019-07-16 | DRG: 270 | Disposition: A | Discharge status: Home Health | Payer: Medicare Other | Attending: Internal Medicine | Admitting: Internal Medicine

## 2019-07-07 DIAGNOSIS — E669 Obesity, unspecified: Secondary | ICD-10-CM | POA: Diagnosis present

## 2019-07-07 DIAGNOSIS — Z86711 Personal history of pulmonary embolism: Secondary | ICD-10-CM

## 2019-07-07 DIAGNOSIS — Z8052 Family history of malignant neoplasm of bladder: Secondary | ICD-10-CM | POA: Diagnosis not present

## 2019-07-07 DIAGNOSIS — E7800 Pure hypercholesterolemia, unspecified: Secondary | ICD-10-CM | POA: Diagnosis present

## 2019-07-07 DIAGNOSIS — T82868A Thrombosis of vascular prosthetic devices, implants and grafts, initial encounter: Secondary | ICD-10-CM | POA: Diagnosis not present

## 2019-07-07 DIAGNOSIS — Y832 Surgical operation with anastomosis, bypass or graft as the cause of abnormal reaction of the patient, or of later complication, without mention of misadventure at the time of the procedure: Secondary | ICD-10-CM | POA: Diagnosis present

## 2019-07-07 DIAGNOSIS — E1165 Type 2 diabetes mellitus with hyperglycemia: Secondary | ICD-10-CM | POA: Diagnosis not present

## 2019-07-07 DIAGNOSIS — Z6836 Body mass index (BMI) 36.0-36.9, adult: Secondary | ICD-10-CM

## 2019-07-07 DIAGNOSIS — Z7982 Long term (current) use of aspirin: Secondary | ICD-10-CM

## 2019-07-07 DIAGNOSIS — Z95828 Presence of other vascular implants and grafts: Secondary | ICD-10-CM | POA: Diagnosis not present

## 2019-07-07 DIAGNOSIS — Z20822 Contact with and (suspected) exposure to covid-19: Secondary | ICD-10-CM | POA: Diagnosis present

## 2019-07-07 DIAGNOSIS — R739 Hyperglycemia, unspecified: Secondary | ICD-10-CM | POA: Diagnosis not present

## 2019-07-07 DIAGNOSIS — Z86718 Personal history of other venous thrombosis and embolism: Secondary | ICD-10-CM

## 2019-07-07 DIAGNOSIS — R109 Unspecified abdominal pain: Secondary | ICD-10-CM

## 2019-07-07 DIAGNOSIS — K760 Fatty (change of) liver, not elsewhere classified: Secondary | ICD-10-CM | POA: Diagnosis not present

## 2019-07-07 DIAGNOSIS — E785 Hyperlipidemia, unspecified: Secondary | ICD-10-CM | POA: Diagnosis present

## 2019-07-07 DIAGNOSIS — Z833 Family history of diabetes mellitus: Secondary | ICD-10-CM | POA: Diagnosis not present

## 2019-07-07 DIAGNOSIS — I82461 Acute embolism and thrombosis of right calf muscular vein: Secondary | ICD-10-CM | POA: Diagnosis present

## 2019-07-07 DIAGNOSIS — K219 Gastro-esophageal reflux disease without esophagitis: Secondary | ICD-10-CM | POA: Diagnosis present

## 2019-07-07 DIAGNOSIS — K661 Hemoperitoneum: Secondary | ICD-10-CM | POA: Diagnosis not present

## 2019-07-07 DIAGNOSIS — I82491 Acute embolism and thrombosis of other specified deep vein of right lower extremity: Secondary | ICD-10-CM

## 2019-07-07 DIAGNOSIS — E1169 Type 2 diabetes mellitus with other specified complication: Secondary | ICD-10-CM | POA: Diagnosis not present

## 2019-07-07 DIAGNOSIS — I8290 Acute embolism and thrombosis of unspecified vein: Secondary | ICD-10-CM | POA: Diagnosis present

## 2019-07-07 DIAGNOSIS — I82220 Acute embolism and thrombosis of inferior vena cava: Secondary | ICD-10-CM | POA: Diagnosis present

## 2019-07-07 DIAGNOSIS — G4733 Obstructive sleep apnea (adult) (pediatric): Secondary | ICD-10-CM | POA: Diagnosis present

## 2019-07-07 DIAGNOSIS — I700 Atherosclerosis of aorta: Secondary | ICD-10-CM | POA: Diagnosis not present

## 2019-07-07 DIAGNOSIS — I82451 Acute embolism and thrombosis of right peroneal vein: Secondary | ICD-10-CM | POA: Diagnosis present

## 2019-07-07 DIAGNOSIS — I82423 Acute embolism and thrombosis of iliac vein, bilateral: Secondary | ICD-10-CM | POA: Diagnosis present

## 2019-07-07 DIAGNOSIS — I82409 Acute embolism and thrombosis of unspecified deep veins of unspecified lower extremity: Secondary | ICD-10-CM

## 2019-07-07 DIAGNOSIS — K5900 Constipation, unspecified: Secondary | ICD-10-CM | POA: Diagnosis not present

## 2019-07-07 DIAGNOSIS — E119 Type 2 diabetes mellitus without complications: Secondary | ICD-10-CM | POA: Diagnosis not present

## 2019-07-07 DIAGNOSIS — Z8551 Personal history of malignant neoplasm of bladder: Secondary | ICD-10-CM

## 2019-07-07 DIAGNOSIS — Z03818 Encounter for observation for suspected exposure to other biological agents ruled out: Secondary | ICD-10-CM | POA: Diagnosis not present

## 2019-07-07 DIAGNOSIS — D62 Acute posthemorrhagic anemia: Secondary | ICD-10-CM | POA: Diagnosis not present

## 2019-07-07 DIAGNOSIS — I82411 Acute embolism and thrombosis of right femoral vein: Secondary | ICD-10-CM | POA: Diagnosis present

## 2019-07-07 DIAGNOSIS — M79661 Pain in right lower leg: Secondary | ICD-10-CM | POA: Diagnosis not present

## 2019-07-07 DIAGNOSIS — I82403 Acute embolism and thrombosis of unspecified deep veins of lower extremity, bilateral: Secondary | ICD-10-CM | POA: Diagnosis not present

## 2019-07-07 DIAGNOSIS — M79609 Pain in unspecified limb: Secondary | ICD-10-CM

## 2019-07-07 DIAGNOSIS — I82401 Acute embolism and thrombosis of unspecified deep veins of right lower extremity: Secondary | ICD-10-CM | POA: Diagnosis not present

## 2019-07-07 DIAGNOSIS — Z85828 Personal history of other malignant neoplasm of skin: Secondary | ICD-10-CM | POA: Diagnosis not present

## 2019-07-07 DIAGNOSIS — I82431 Acute embolism and thrombosis of right popliteal vein: Secondary | ICD-10-CM | POA: Diagnosis present

## 2019-07-07 DIAGNOSIS — M7989 Other specified soft tissue disorders: Secondary | ICD-10-CM | POA: Diagnosis not present

## 2019-07-07 DIAGNOSIS — I959 Hypotension, unspecified: Secondary | ICD-10-CM | POA: Diagnosis not present

## 2019-07-07 DIAGNOSIS — I82441 Acute embolism and thrombosis of right tibial vein: Secondary | ICD-10-CM | POA: Diagnosis present

## 2019-07-07 DIAGNOSIS — Z79899 Other long term (current) drug therapy: Secondary | ICD-10-CM | POA: Diagnosis not present

## 2019-07-07 DIAGNOSIS — E8770 Fluid overload, unspecified: Secondary | ICD-10-CM | POA: Diagnosis not present

## 2019-07-07 DIAGNOSIS — R110 Nausea: Secondary | ICD-10-CM | POA: Diagnosis not present

## 2019-07-07 DIAGNOSIS — K5730 Diverticulosis of large intestine without perforation or abscess without bleeding: Secondary | ICD-10-CM | POA: Diagnosis not present

## 2019-07-07 DIAGNOSIS — J9601 Acute respiratory failure with hypoxia: Secondary | ICD-10-CM | POA: Diagnosis not present

## 2019-07-07 DIAGNOSIS — R600 Localized edema: Secondary | ICD-10-CM | POA: Diagnosis not present

## 2019-07-07 DIAGNOSIS — E876 Hypokalemia: Secondary | ICD-10-CM | POA: Diagnosis not present

## 2019-07-07 LAB — CBC WITH DIFFERENTIAL/PLATELET
Abs Immature Granulocytes: 0.05 10*3/uL (ref 0.00–0.07)
Basophils Absolute: 0.1 10*3/uL (ref 0.0–0.1)
Basophils Relative: 1 %
Eosinophils Absolute: 0.1 10*3/uL (ref 0.0–0.5)
Eosinophils Relative: 1 %
HCT: 41.2 % (ref 36.0–46.0)
Hemoglobin: 13.3 g/dL (ref 12.0–15.0)
Immature Granulocytes: 1 %
Lymphocytes Relative: 17 %
Lymphs Abs: 1.8 10*3/uL (ref 0.7–4.0)
MCH: 28.1 pg (ref 26.0–34.0)
MCHC: 32.3 g/dL (ref 30.0–36.0)
MCV: 86.9 fL (ref 80.0–100.0)
Monocytes Absolute: 0.7 10*3/uL (ref 0.1–1.0)
Monocytes Relative: 7 %
Neutro Abs: 7.6 10*3/uL (ref 1.7–7.7)
Neutrophils Relative %: 73 %
Platelets: 163 10*3/uL (ref 150–400)
RBC: 4.74 MIL/uL (ref 3.87–5.11)
RDW: 13.4 % (ref 11.5–15.5)
WBC: 10.2 10*3/uL (ref 4.0–10.5)
nRBC: 0 % (ref 0.0–0.2)

## 2019-07-07 LAB — PROTIME-INR
INR: 1.1 (ref 0.8–1.2)
Prothrombin Time: 13.6 seconds (ref 11.4–15.2)

## 2019-07-07 LAB — URINALYSIS, ROUTINE W REFLEX MICROSCOPIC
Bacteria, UA: NONE SEEN
Bilirubin Urine: NEGATIVE
Glucose, UA: >=500 mg/dL — AB
Ketones, ur: 20 mg/dL — AB
Nitrite: NEGATIVE
Protein, ur: NEGATIVE mg/dL
Specific Gravity, Urine: 1.038 — ABNORMAL HIGH (ref 1.005–1.030)
pH: 5 (ref 5.0–8.0)

## 2019-07-07 LAB — COMPREHENSIVE METABOLIC PANEL
ALT: 28 U/L (ref 0–44)
AST: 16 U/L (ref 15–41)
Albumin: 4.1 g/dL (ref 3.5–5.0)
Alkaline Phosphatase: 122 U/L (ref 38–126)
Anion gap: 14 (ref 5–15)
BUN: 22 mg/dL (ref 8–23)
CO2: 22 mmol/L (ref 22–32)
Calcium: 9.8 mg/dL (ref 8.9–10.3)
Chloride: 97 mmol/L — ABNORMAL LOW (ref 98–111)
Creatinine, Ser: 1.05 mg/dL — ABNORMAL HIGH (ref 0.44–1.00)
GFR calc Af Amer: >60 mL/min (ref >60)
GFR calc non Af Amer: 55 mL/min — ABNORMAL LOW (ref >60)
Glucose, Bld: 427 mg/dL — ABNORMAL HIGH (ref 70–99)
Potassium: 4.1 mmol/L (ref 3.5–5.1)
Sodium: 133 mmol/L — ABNORMAL LOW (ref 135–145)
Total Bilirubin: 1.1 mg/dL (ref 0.3–1.2)
Total Protein: 7.5 g/dL (ref 6.5–8.1)

## 2019-07-07 LAB — SARS CORONAVIRUS 2 (TAT 6-24 HRS): SARS Coronavirus 2: NEGATIVE

## 2019-07-07 LAB — HEPARIN LEVEL (UNFRACTIONATED): Heparin Unfractionated: 1.68 IU/mL — ABNORMAL HIGH (ref 0.30–0.70)

## 2019-07-07 LAB — GLUCOSE, CAPILLARY
Glucose-Capillary: 357 mg/dL — ABNORMAL HIGH (ref 70–99)
Glucose-Capillary: 311 mg/dL — ABNORMAL HIGH (ref 70–99)

## 2019-07-07 LAB — HIV ANTIBODY (ROUTINE TESTING W REFLEX): HIV Screen 4th Generation wRfx: NONREACTIVE

## 2019-07-07 LAB — HEMOGLOBIN A1C
Hgb A1c MFr Bld: 11.2 % — ABNORMAL HIGH (ref 4.8–5.6)
Mean Plasma Glucose: 274.74 mg/dL

## 2019-07-07 MED ORDER — HYDROCODONE-ACETAMINOPHEN 5-325 MG PO TABS: 1.0000 | ORAL_TABLET | Freq: Once | ORAL | Status: DC

## 2019-07-07 MED ORDER — HYDROMORPHONE HCL 1 MG/ML IJ SOLN
1.0000 mg | INTRAMUSCULAR | Status: DC | PRN
  Administered 2019-07-08: 1 mg via INTRAVENOUS
  Filled 2019-07-07: qty 1

## 2019-07-07 MED ORDER — SIMVASTATIN 20 MG PO TABS
40.0000 mg | ORAL_TABLET | Freq: Every day | ORAL | Status: DC
  Administered 2019-07-07 – 2019-07-12 (×5): 40 mg via ORAL
  Filled 2019-07-07 (×5): qty 2

## 2019-07-07 MED ORDER — ACETAMINOPHEN 325 MG PO TABS
650.0000 mg | ORAL_TABLET | Freq: Four times a day (QID) | ORAL | Status: DC | PRN
  Administered 2019-07-08 – 2019-07-14 (×2): 650 mg via ORAL
  Filled 2019-07-07 (×2): qty 2

## 2019-07-07 MED ORDER — IOHEXOL 350 MG/ML SOLN
100.0000 mL | Freq: Once | INTRAVENOUS | Status: AC | PRN
  Administered 2019-07-07: 100 mL via INTRAVENOUS

## 2019-07-07 MED ORDER — FENTANYL CITRATE (PF) 100 MCG/2ML IJ SOLN
50.0000 ug | Freq: Once | INTRAMUSCULAR | Status: AC
  Administered 2019-07-07: 11:00:00 50 ug via INTRAVENOUS
  Filled 2019-07-07: qty 2

## 2019-07-07 MED ORDER — CYCLOBENZAPRINE HCL 5 MG PO TABS
5.0000 mg | ORAL_TABLET | Freq: Three times a day (TID) | ORAL | Status: DC | PRN
  Administered 2019-07-07: 5 mg via ORAL
  Filled 2019-07-07: qty 1

## 2019-07-07 MED ORDER — SODIUM CHLORIDE 0.9% FLUSH
3.0000 mL | Freq: Two times a day (BID) | INTRAVENOUS | Status: DC
  Administered 2019-07-07 – 2019-07-16 (×13): 3 mL via INTRAVENOUS

## 2019-07-07 MED ORDER — HEPARIN (PORCINE) 25000 UT/250ML-% IV SOLN
1550.0000 [IU]/h | INTRAVENOUS | Status: DC
  Administered 2019-07-07: 1550 [IU]/h via INTRAVENOUS
  Filled 2019-07-07: qty 250

## 2019-07-07 MED ORDER — POLYETHYLENE GLYCOL 3350 17 G PO PACK: 17.0000 g | PACK | Freq: Every day | ORAL | Status: DC | PRN

## 2019-07-07 MED ORDER — FENTANYL CITRATE (PF) 100 MCG/2ML IJ SOLN: 50.0000 ug | INTRAMUSCULAR | Status: DC | PRN

## 2019-07-07 MED ORDER — LIDOCAINE-EPINEPHRINE (PF) 2 %-1:200000 IJ SOLN: 10.0000 mL | Freq: Once | INTRAMUSCULAR | Status: DC

## 2019-07-07 MED ORDER — ONDANSETRON HCL 4 MG/2ML IJ SOLN: 4.0000 mg | Freq: Four times a day (QID) | INTRAMUSCULAR | Status: DC | PRN

## 2019-07-07 MED ORDER — HEPARIN BOLUS VIA INFUSION
6400.0000 [IU] | Freq: Once | INTRAVENOUS | Status: AC
  Administered 2019-07-07: 6400 [IU] via INTRAVENOUS
  Filled 2019-07-07: qty 6400

## 2019-07-07 MED ORDER — ONDANSETRON HCL 4 MG/2ML IJ SOLN
4.0000 mg | Freq: Four times a day (QID) | INTRAMUSCULAR | Status: DC | PRN
  Administered 2019-07-07 – 2019-07-15 (×6): 4 mg via INTRAVENOUS
  Filled 2019-07-07 (×4): qty 2

## 2019-07-07 MED ORDER — LACTATED RINGERS IV SOLN: INTRAVENOUS | Status: DC

## 2019-07-07 MED ORDER — INSULIN ASPART 100 UNIT/ML ~~LOC~~ SOLN
0.0000 [IU] | Freq: Three times a day (TID) | SUBCUTANEOUS | Status: DC
  Administered 2019-07-07: 15 [IU] via SUBCUTANEOUS
  Administered 2019-07-08: 5 [IU] via SUBCUTANEOUS
  Administered 2019-07-08: 8 [IU] via SUBCUTANEOUS
  Administered 2019-07-09: 3 [IU] via SUBCUTANEOUS
  Administered 2019-07-09: 5 [IU] via SUBCUTANEOUS
  Administered 2019-07-10: 8 [IU] via SUBCUTANEOUS
  Administered 2019-07-10: 5 [IU] via SUBCUTANEOUS
  Administered 2019-07-10: 8 [IU] via SUBCUTANEOUS
  Administered 2019-07-11 (×2): 5 [IU] via SUBCUTANEOUS
  Administered 2019-07-11: 3 [IU] via SUBCUTANEOUS
  Administered 2019-07-12: 5 [IU] via SUBCUTANEOUS
  Administered 2019-07-12 (×2): 3 [IU] via SUBCUTANEOUS
  Administered 2019-07-13: 2 [IU] via SUBCUTANEOUS
  Administered 2019-07-13 – 2019-07-14 (×4): 3 [IU] via SUBCUTANEOUS
  Administered 2019-07-14: 12:00:00 5 [IU] via SUBCUTANEOUS
  Administered 2019-07-15: 3 [IU] via SUBCUTANEOUS
  Administered 2019-07-15 – 2019-07-16 (×2): 5 [IU] via SUBCUTANEOUS
  Administered 2019-07-16: 2 [IU] via SUBCUTANEOUS

## 2019-07-07 MED ORDER — HEPARIN (PORCINE) 25000 UT/250ML-% IV SOLN
1200.0000 [IU]/h | INTRAVENOUS | Status: DC
  Administered 2019-07-07: 1200 [IU]/h via INTRAVENOUS
  Filled 2019-07-07: qty 250

## 2019-07-07 MED ORDER — HYDRALAZINE HCL 20 MG/ML IJ SOLN: 5.0000 mg | INTRAMUSCULAR | Status: DC | PRN

## 2019-07-07 MED ORDER — INSULIN ASPART 100 UNIT/ML ~~LOC~~ SOLN
0.0000 [IU] | Freq: Every day | SUBCUTANEOUS | Status: DC
  Administered 2019-07-07: 4 [IU] via SUBCUTANEOUS
  Administered 2019-07-08: 3 [IU] via SUBCUTANEOUS
  Administered 2019-07-09: 4 [IU] via SUBCUTANEOUS
  Administered 2019-07-14: 2 [IU] via SUBCUTANEOUS

## 2019-07-07 MED ORDER — MONTELUKAST SODIUM 10 MG PO TABS
10.0000 mg | ORAL_TABLET | Freq: Every day | ORAL | Status: DC
  Administered 2019-07-07 – 2019-07-15 (×8): 10 mg via ORAL
  Filled 2019-07-07 (×9): qty 1

## 2019-07-07 MED ORDER — HYDROMORPHONE HCL 1 MG/ML IJ SOLN
1.0000 mg | Freq: Once | INTRAMUSCULAR | Status: AC
  Administered 2019-07-07: 1 mg via INTRAVENOUS
  Filled 2019-07-07: qty 1

## 2019-07-07 MED ORDER — HYDROMORPHONE HCL 1 MG/ML IJ SOLN
1.0000 mg | INTRAMUSCULAR | Status: DC | PRN
  Administered 2019-07-07: 1 mg via INTRAVENOUS
  Filled 2019-07-07: qty 1

## 2019-07-07 MED ORDER — LIVING WELL WITH DIABETES BOOK
Freq: Once | Status: AC
  Filled 2019-07-07: qty 1

## 2019-07-07 MED ORDER — HYDROCODONE-ACETAMINOPHEN 5-325 MG PO TABS
1.0000 | ORAL_TABLET | ORAL | Status: DC | PRN
  Administered 2019-07-07: 1 via ORAL
  Filled 2019-07-07: qty 1

## 2019-07-07 MED ORDER — ZOLPIDEM TARTRATE 5 MG PO TABS
5.0000 mg | ORAL_TABLET | Freq: Every evening | ORAL | Status: DC | PRN
  Administered 2019-07-07 – 2019-07-14 (×7): 5 mg via ORAL
  Filled 2019-07-07 (×7): qty 1

## 2019-07-07 MED ORDER — BISACODYL 5 MG PO TBEC: 5.0000 mg | DELAYED_RELEASE_TABLET | Freq: Every day | ORAL | Status: DC | PRN

## 2019-07-07 MED ORDER — ONDANSETRON HCL 4 MG PO TABS: 4.0000 mg | ORAL_TABLET | Freq: Four times a day (QID) | ORAL | Status: DC | PRN

## 2019-07-07 MED ORDER — DOCUSATE SODIUM 100 MG PO CAPS
100.0000 mg | ORAL_CAPSULE | Freq: Two times a day (BID) | ORAL | Status: DC
  Administered 2019-07-07 – 2019-07-11 (×6): 100 mg via ORAL
  Filled 2019-07-07 (×6): qty 1

## 2019-07-07 MED ORDER — SULFAMETHOXAZOLE-TRIMETHOPRIM 800-160 MG PO TABS: 1.0000 | ORAL_TABLET | Freq: Once | ORAL | Status: DC

## 2019-07-07 MED ORDER — KETOROLAC TROMETHAMINE 30 MG/ML IJ SOLN
30.0000 mg | Freq: Once | INTRAMUSCULAR | Status: AC
  Administered 2019-07-07: 30 mg via INTRAVENOUS
  Filled 2019-07-07: qty 1

## 2019-07-07 MED ORDER — INSULIN ASPART 100 UNIT/ML ~~LOC~~ SOLN: 8.0000 [IU] | Freq: Once | SUBCUTANEOUS | Status: DC

## 2019-07-07 MED ORDER — ACETAMINOPHEN 650 MG RE SUPP: 650.0000 mg | Freq: Four times a day (QID) | RECTAL | Status: DC | PRN

## 2019-07-07 MED ORDER — IBUPROFEN 800 MG PO TABS: 800.0000 mg | ORAL_TABLET | Freq: Once | ORAL | Status: DC

## 2019-07-07 MED ORDER — ONDANSETRON HCL 4 MG/2ML IJ SOLN
4.0000 mg | Freq: Once | INTRAMUSCULAR | Status: AC
  Administered 2019-07-07: 4 mg via INTRAVENOUS
  Filled 2019-07-07: qty 2

## 2019-07-07 NOTE — Plan of Care (Signed)
  Problem: Education: Goal: Knowledge of General Education information will improve Description: Including pain rating scale, medication(s)/side effects and non-pharmacologic comfort measures Outcome: Progressing   Problem: Clinical Measurements: Goal: Diagnostic test results will improve Outcome: Progressing   Problem: Activity: Goal: Risk for activity intolerance will decrease Outcome: Progressing   Problem: Coping: Goal: Level of anxiety will decrease Outcome: Progressing   Problem: Pain Managment: Goal: General experience of comfort will improve Outcome: Progressing   Problem: Safety: Goal: Ability to remain free from injury will improve Outcome: Progressing

## 2019-07-07 NOTE — Progress Notes (Signed)
New Admission Note:   Arrival Method: Stretcher Mental Orientation: Alert and oriented x 4  Telemetry: NSR  Assessment: Completed Skin: intact, reddened LE IV: left antecubital on heparin drip  Pain: scale of 2/10 Tubes: none Safety Measures: Safety Fall Prevention Plan has been given, discussed and signed Admission: Completed [facility] Orientation: Patient has been orientated to the room, unit and staff.  Family: Husband at bedside  Orders have been reviewed and implemented. Will continue to monitor the patient. Call light has been placed within reach and bed alarm has been activated.   Dolores Hoose, RN

## 2019-07-07 NOTE — Progress Notes (Signed)
Pt developed severe 10/10 pain in Left calf that radiates upward to her thigh and buttocks. Pedal pulses present via doppler. VSS. Spoke with Dr. Trula Slade with vascular who stated as long as pedal pulses were present to ensure heparin level was adequate and treat with additional pain medicine. Ordered Dilaudid 1-2mg  PRN Q2H and Flexeril TID PRN. Will continue to monitor  Arby Barrette AGPCNP-BC, AGNP-C [facility] Pager [phone]

## 2019-07-07 NOTE — Progress Notes (Signed)
VASCULAR LAB PRELIMINARY  PRELIMINARY  PRELIMINARY  PRELIMINARY  IVC/Iliac duplex completed.    Preliminary report:  See CV proc for preliminary results.  Called Dr. Gilford Raid with report  Sharion Dove, RVT 07/07/2019, 9:29 AM

## 2019-07-07 NOTE — Progress Notes (Signed)
VASCULAR LAB PRELIMINARY  PRELIMINARY  PRELIMINARY  PRELIMINARY  Right lower extremity venous duplex completed.    Preliminary report:  See CV proc for preliminary results.  Called Dr. Gilford Raid with report  Sharion Dove, RVT 07/07/2019, 9:27 AM

## 2019-07-07 NOTE — H&P (Signed)
History and Physical    Barbara Thomas [id number] DOB: April 06, 1950 DOA: 07/07/2019  PCP: Ann Held, DO Consultants:  Henrene Pastor - GI Patient coming from:  Home - lives with husband; NOKMorene Thomas, [phone]  Chief Complaint: RLE pain  HPI: Barbara Thomas is a 69 y.o. female with medical history significant of OSA on CPAP; HLD; DM; and h/o DVT/PE presenting with RLE pain and swelling from knee to foot.  She reports that she broke her toe last Sunday.  She wasn't moving around as much and then drove to [place], [place] on Thursday.  They drove around for several hours looking at the area.  Her leg was swollen and red and kept getting worse.  They barely made it through the rehearsal dinner and so skipped the wedding and drove straight to the hospital.     She had a clot in the past when she broke her ankle.  She was sent home from CIR with a cast.  She got "bad" blood clots in both legs and lungs and a filter was placed.  That was in 2011 and she has had no problems since.  She was on blood thinners for maybe 4-5 years, until she saw Dr. Marin Olp and he took her off.  She has taken 81 mg ASA religiously since then.    She was very thirsty at the rehearsal dinner.  She drank a lot of sweet tea.  In April 2020 she had an A1c of 7.2 and she was not treated.    She broke her toe when she ran into a door.  She is wearing a hard shoe.    ED Course:  H/o remote DVT/PE.  Saw [facility] in 2014, taken off AC.  Has IVC filter.  Broke her foot, drove to [place] for a wedding and then drove back to ER.  Huge DVT up to IVC filter.  Dr. Trula Slade recommends admission with heparin, plan for clot evacuation later this week.  Apparently DM in 2020 but never treated, glucose today >400.  Review of Systems: As per HPI; otherwise review of systems reviewed and negative.   Ambulatory Status:  Ambulates without assistance  COVID Vaccine Status:  Complete  Past Medical History:  Diagnosis Date  . Allergy   .  Arthritis   . Blood transfusion without reported diagnosis    1987  . Cancer (HCC)    Basal Cell Carcinoma  . Cataract   . Chronic kidney disease    kidney stone once  . H/O hiatal hernia   . History of basal cell carcinoma excision    NOSE  . History of benign bladder tumor   . History of DVT of lower extremity    11/ 2011  BILATERAL  POST FOOT SURGERY  . History of pulmonary embolus (PE)    12/ 2011   POST FOOT SURGERY  . Hyperlipidemia   . Left ureteral calculus   . Migraines   . OSA on CPAP    STUDY DONE 2012  . PONV (postoperative nausea and vomiting)    severe  . Sigmoid diverticulosis   . Wears glasses     Past Surgical History:  Procedure Laterality Date  . BENIGN RIGHT BREAST BX  12-06-2010  . COLONOSCOPY    . CYSTOSCOPY WITH RETROGRADE PYELOGRAM, URETEROSCOPY AND STENT PLACEMENT Left 11/25/2013   Procedure: CYSTOSCOPY WITH RETROGRADE PYELOGRAM, URETEROSCOPY AND STENT PLACEMENT WITH COLD CUP RESECTION OF BLADDER TUMOR ;  Surgeon: Bernestine Amass, MD;  Location: [facility];  Service: Urology;  Laterality: Left;  . HIP PINNING Left 1985  . HOLMIUM LASER APPLICATION Left [id number]   Procedure: HOLMIUM LASER APPLICATION;  Surgeon: Bernestine Amass, MD;  Location: [facility];  Service: Urology;  Laterality: Left;  . INCONTINENCE SURGERY    . INSERTION OF VENA CAVA FILTER  12/ 2011   ECLIPSE  . ORIF LEFT ANKLE FX  11/ 2011  . PARTIAL HIP ARTHROPLASTY Left 1987  . REVISION HIP HEMIARTHROPLASTY  Left 2004  . VAGINAL HYSTERECTOMY  2005   W/  BILATERAL SALPINGOOPHORECTOMY AND BLADDER SLING PROCEDURE    Social History   Socioeconomic History  . Marital status: Married    Spouse name: Not on file  . Number of children: Not on file  . Years of education: Not on file  . Highest education level: Not on file  Occupational History  . Occupation: housewife  Tobacco Use  . Smoking status: Never Smoker  . Smokeless tobacco: Never Used    Substance and Sexual Activity  . Alcohol use: No  . Drug use: No  . Sexual activity: Not on file  Other Topics Concern  . Not on file  Social History Narrative   Exercise--- walking--- qd short distance   Social Determinants of Health   Financial Resource Strain:   . Difficulty of Paying Living Expenses:   Food Insecurity:   . Worried About Charity fundraiser in the Last Year:   . Arboriculturist in the Last Year:   Transportation Needs:   . Film/video editor (Medical):   Marland Kitchen Lack of Transportation (Non-Medical):   Physical Activity:   . Days of Exercise per Week:   . Minutes of Exercise per Session:   Stress:   . Feeling of Stress :   Social Connections:   . Frequency of Communication with Friends and Family:   . Frequency of Social Gatherings with Friends and Family:   . Attends Religious Services:   . Active Member of Clubs or Organizations:   . Attends Archivist Meetings:   Marland Kitchen Marital Status:   Intimate Partner Violence:   . Fear of Current or Ex-Partner:   . Emotionally Abused:   Marland Kitchen Physically Abused:   . Sexually Abused:     Allergies  Allergen Reactions  . Penicillins Rash  . Morphine And Related Nausea And Vomiting    Family History  Problem Relation Age of Onset  . Transient ischemic attack Mother   . Hypertension Mother   . Dementia Mother   . Atrial fibrillation Mother   . Cancer Father        BLADDER CANCER  . Hypertension Father   . Stroke Paternal Grandmother   . Sudden death Maternal Grandmother   . Diabetes Brother   . Colon cancer Neg Hx   . Colon polyps Neg Hx   . Esophageal cancer Neg Hx   . Rectal cancer Neg Hx   . Stomach cancer Neg Hx   . Clotting disorder Neg Hx     Prior to Admission medications   Medication Sig Start Date End Date Taking? Authorizing Provider  aspirin 81 MG chewable tablet Chew 162 mg by mouth daily.    [provider]  CALCIUM PO Take by mouth.    [provider]   cholecalciferol (VITAMIN D) 1000 units tablet Take 1 tablet (1,000 Units total) by mouth daily. 01/05/17   Roma Schanz R, DO  co-enzyme  Q-10 30 MG capsule Take 30 mg by mouth daily.    [provider]  glucosamine-chondroitin 500-400 MG tablet Take 1 tablet by mouth 3 (three) times daily.    [provider]  montelukast (SINGULAIR) 10 MG tablet TAKE 1 TABLET AT BEDTIME 06/12/19   Ann Held, DO  Multiple Vitamin (MULTIVITAMIN) tablet Take 1 tablet by mouth daily.      [provider]  Omega-3 Fatty Acids (FISH OIL PO) Take by mouth.    [provider]  simvastatin (ZOCOR) 40 MG tablet TAKE 1 TABLET AT BEDTIME 06/12/19   Ann Held, DO    Physical Exam: Vitals:   07/07/19 1330 07/07/19 1345 07/07/19 1400 07/07/19 1612  BP: 138/66 (!) 144/77 (!) 148/72 (!) 153/75  Pulse: 80 86 81 90  Resp:    18  Temp:      TempSrc:      SpO2: 100% 100% 99% 97%  Weight:    103.5 kg  Height:    5\' 11"  (1.803 m)     . General:  Appears calm and comfortable and is NAD . Eyes:  PERRL, EOMI, normal lids, iris . ENT:  grossly normal hearing, lips & tongue, mmm; appropriate dentition . Neck:  no LAD, masses or thyromegaly . Cardiovascular:  RRR, no m/r/g.   . Respiratory:   CTA bilaterally with no wheezes/rales/rhonchi.  Normal respiratory effort. . Abdomen:  soft, NT, ND, NABS . Skin:  RLE > LLE erythema with mild edema       . Musculoskeletal:  RLE fullness, with posterior calf cords; R foot in a hard shoe. Marland Kitchen Psychiatric:  grossly normal mood and affect, speech fluent and appropriate, AOx3 . Neurologic:  CN 2-12 grossly intact, moves all extremities in coordinated fashion    Radiological Exams on Admission: VAS [place] IVC/ILIAC (VENOUS ONLY)  Result Date: 07/07/2019 IVC/ILIAC STUDY Indications: Acute DVT throughout right lower extremity Other Factors: Lupus, bladder cancer with resection and stenting. History of DVT                 and PE. Vascular Interventions: History of IVC filter placed in [place] in 2012 post PE. Limitations: Air/bowel gas and body habitus.  Comparison Study: No prior study on file Performing Technologist: Sharion Dove RVS Supporting Technologist: Maudry Mayhew RDMS, RVT, RDCS  Examination Guidelines: A complete evaluation includes B-mode imaging, spectral Doppler, color Doppler, and power Doppler as needed of all accessible portions of each vessel. Bilateral testing is considered an integral part of a complete examination. Limited examinations for reoccurring indications may be performed as noted.  IVC/Iliac Findings: +--------+------+--------+--------+   IVC   PatentThrombusComments +--------+------+--------+--------+ IVC Prox       acute           +--------+------+--------+--------+  +-------------------+---------+-----------+---------+-----------+--------+         CIV        RT-PatentRT-ThrombusLT-PatentLT-ThrombusComments +-------------------+---------+-----------+---------+-----------+--------+ Common Iliac Prox              acute                                +-------------------+---------+-----------+---------+-----------+--------+ Common Iliac Mid               acute                                +-------------------+---------+-----------+---------+-----------+--------+ Common Iliac Distal  acute               acute            +-------------------+---------+-----------+---------+-----------+--------+  +-------------------------+---------+-----------+---------+-----------+--------+            EIV           RT-PatentRT-ThrombusLT-PatentLT-ThrombusComments +-------------------------+---------+-----------+---------+-----------+--------+ External Iliac Vein Prox             acute               acute            +-------------------------+---------+-----------+---------+-----------+--------+ External Iliac Vein Mid              acute                acute            +-------------------------+---------+-----------+---------+-----------+--------+ External Iliac Vein                  acute               acute            Distal                                                                    +-------------------------+---------+-----------+---------+-----------+--------+   Summary: IVC/Iliac: There is evidence of acute thrombus involving the IVC. There is evidence of acute thrombus involving the right common iliac vein. There is evidence of acute thrombus involving the left common iliac vein. There is evidence of acute thrombus involving the right external iliac vein. There is evidence of acute thrombus involving the left external iliac vein.  *See table(s) above for measurements and observations.  Electronically signed by Harold Barban MD on 07/07/2019 at 11:39:53 AM.    Final    DG Foot Complete Right  Result Date: 07/07/2019 CLINICAL DATA:  Pain, fractured small toe a week ago, increased pain and swelling EXAM: RIGHT FOOT COMPLETE - 3+ VIEW COMPARISON:  None. FINDINGS: Suspect a very subtle nondisplaced fracture of the base of the right fifth proximal phalanx. No other evident fracture. Joint spaces are preserved. Diffuse soft tissue edema about the forefoot. IMPRESSION: 1. Suspect a very subtle nondisplaced fracture of the base of the right fifth proximal phalanx, in keeping with reported fracture. 2.  Diffuse soft tissue edema about the forefoot. Electronically Signed   By: Eddie Candle M.D.   On: 07/07/2019 10:42   VAS [place] LOWER EXTREMITY VENOUS (DVT) (ONLY [facility] & [facility])  Result Date: 07/07/2019  Lower Venous DVTStudy Indications: Pain, Swelling, and History of DVT. Recent car trip to [place].  Risk Factors: Cancer bladder cancer with stenting and resection in 2015 DVT Bilateral femoral veins in 2012 lupus, IVC filter placed in 01/2011 post PE, in [place]. Comparison Study: Prior study from 02/23/13 is available for comparison  Performing Technologist: Sharion Dove RVS  Examination Guidelines: A complete evaluation includes B-mode imaging, spectral Doppler, color Doppler, and power Doppler as needed of all accessible portions of each vessel. Bilateral testing is considered an integral part of a complete examination. Limited examinations for reoccurring indications may be performed as noted. The reflux portion of the exam is performed with the patient in reverse Trendelenburg.  +---------+---------------+---------+-----------+----------+--------------+ RIGHT    CompressibilityPhasicitySpontaneityPropertiesThrombus  Aging +---------+---------------+---------+-----------+----------+--------------+ CFV      None           No       No                   Acute          +---------+---------------+---------+-----------+----------+--------------+ SFJ      None                                         Acute          +---------+---------------+---------+-----------+----------+--------------+ FV Prox  None                                         Acute          +---------+---------------+---------+-----------+----------+--------------+ FV Mid   None                                         Acute          +---------+---------------+---------+-----------+----------+--------------+ FV DistalNone                                         Acute          +---------+---------------+---------+-----------+----------+--------------+ PFV      None                                         Acute          +---------+---------------+---------+-----------+----------+--------------+ POP      None           No       No                   Acute          +---------+---------------+---------+-----------+----------+--------------+ PTV      None                                         Acute          +---------+---------------+---------+-----------+----------+--------------+ PERO     None                                          Acute          +---------+---------------+---------+-----------+----------+--------------+ Gastroc  None                                         Acute          +---------+---------------+---------+-----------+----------+--------------+ GSV      Full                                                        +---------+---------------+---------+-----------+----------+--------------+   +----+---------------+---------+-----------+----------+--------------+  LEFTCompressibilityPhasicitySpontaneityPropertiesThrombus Aging +----+---------------+---------+-----------+----------+--------------+ CFV Full           No       No                   sluggish flow  +----+---------------+---------+-----------+----------+--------------+     Summary: RIGHT: - Findings consistent with acute deep vein thrombosis involving the right common femoral vein, SF junction, right femoral vein, right proximal profunda vein, right popliteal vein, right posterior tibial veins, right peroneal veins, and right gastrocnemius veins.  LEFT: - No evidence of common femoral vein obstruction.  *See table(s) above for measurements and observations. Electronically signed by Harold Barban MD on 07/07/2019 at 11:40:10 AM.    Final    CT Angio Abd/Pel W and/or Wo Contrast  Result Date: 07/07/2019 CLINICAL DATA:  Leg vein DVT suspected EXAM: CTA ABDOMEN AND PELVIS WITHOUT AND WITH CONTRAST TECHNIQUE: Multidetector CT imaging of the abdomen and pelvis was performed using the standard protocol during bolus administration of intravenous contrast. Multiplanar reconstructed images and MIPs were obtained and reviewed to evaluate the vascular anatomy. CONTRAST:  141mL OMNIPAQUE IOHEXOL 350 MG/ML SOLN COMPARISON:  11/21/2013 FINDINGS: VASCULAR Scattered aortic atherosclerosis. Normal contour and caliber of the abdominal aorta. Standard branching pattern of the abdominal aorta with solitary bilateral renal arteries. There is a  Recovery type IVC filter in the infrarenal IVC. The IVC at the level of the filter and inferiorly and bilateral common iliac veins, as well as included portions of the right internal and external iliac and superior portions of the right femoral veins are somewhat expansile appearing and non-opacified on 120 second delayed phase imaging. Review of the MIP images confirms the above findings. NON-VASCULAR Lower chest: No acute abnormality. Hepatobiliary: No solid liver abnormality is seen. Hepatic steatosis. No gallstones, gallbladder wall thickening, or biliary dilatation. Pancreas: Unremarkable. No pancreatic ductal dilatation or surrounding inflammatory changes. Spleen: Normal in size without significant abnormality. Adrenals/Urinary Tract: Adrenal glands are unremarkable. Kidneys are normal, without renal calculi, solid lesion, or hydronephrosis. Bladder is unremarkable. Stomach/Bowel: Stomach is within normal limits. Appendix appears normal. No evidence of bowel wall thickening, distention, or inflammatory changes. Lymphatic:  No enlarged abdominal or pelvic lymph nodes. Reproductive: Status post hysterectomy. Other: No abdominal wall hernia or abnormality. No abdominopelvic ascites. Musculoskeletal: No acute or significant osseous findings. IMPRESSION: 1. There is a Recovery type IVC filter in the infrarenal IVC. The IVC at the level of the filter and inferiorly and bilateral common iliac veins, as well as included portions of the right internal and external iliac and superior portions of the right femoral veins are somewhat expansile appearing and non-opacified on 120 second delayed phase imaging. Findings are consistent with acute appearing central venous and deep venous thrombosis of the IVC, bilateral common iliac veins, and the included more distal right iliac and femoral veins. 2.  Hepatic steatosis. 3.  Aortic Atherosclerosis (ICD10-I70.0). Electronically Signed   By: Eddie Candle M.D.   On: 07/07/2019  11:49    EKG: pending   Labs on Admission: I have personally reviewed the available labs and imaging studies at the time of the admission.  Pertinent labs:   Glucose 427 BUN 22/Creatinine 1.05/GFR 55 Normal CBC INR 1.1 UA: >500 glucose; small Hgb; 20 ketones; small LE   Assessment/Plan Principal Problem:   Venous thromboembolism confirmed by diagnostic testing Active Problems:   Obesity (BMI 30-39.9)   OSA (obstructive sleep apnea)   Hyperlipidemia LDL goal <100   Diabetes mellitus type 2 in  obese ([place])   VTE -Patient with prior episode of significant thromboembolic disease (B DVT and PE following ankle surgery) presenting with new DVT associated with toe fracture and prolonged car trip -This is an extensive clot, extending up into her IVC filter; as such, Dr. Trula Slade has been consulted and is planning to do a thrombectomy (possibly Tuesday) -Will admit on telemetry -Initiate anticoagulation - for now, will start Heparin drip per pharmacy -She previously took Coumadin and would prefer DOAC for lifelong AC; TOC consult regarding which one will be most cost-effective with her insurance   OSA -Continue CPAP  DM -Patient with prior A1c of 7.2 in 06/2018 -She reports that she was not told she has DM and was never treated -Regardless, she now presents with clear DM - glucose 427, >500 glycosuria, 20 ketones -Will hydrate -She was given IV insulin in the ER but is not in DKA and so does not need a drip -Will start moderate-scale SSI -Diabetes coordinator consult is pending -Fatty liver noted on CT  HLD -Continue Zocor -Hold fish oil due to limited inpatient utility  Obesity Body mass index is 31.82 kg/m.  -Weight loss should be encouraged -Outpatient PCP/bariatric medicine/bariatric surgery f/u encouraged    Note: This patient has been tested and is pending for the novel coronavirus COVID-19.  DVT prophylaxis: Heparin Code Status:  Full - confirmed with  patient/family Family Communication: Husband was present throughout evaluation. Disposition Plan:  The patient is from: home  Anticipated d/c is to: home without [facility] services once her VTE issues have been stabilized.  Anticipated d/c date will depend on clinical response to treatment, likely later this week  Patient is currently: acutely ill Consults called: Vascular surgery Admission status:  Admit - It is my clinical opinion that admission to INPATIENT is reasonable and necessary because of the expectation that this patient will require hospital care that crosses at least 2 midnights to treat this condition based on the medical complexity of the problems presented.  Given the aforementioned information, the predictability of an adverse outcome is felt to be significant.    Karmen Bongo MD [facility]   How to contact the [facility] Attending or Consulting provider [place] or covering provider during after hours [place], for this patient?  1. Check the care team in [facility] and look for a) attending/consulting [facility] provider listed and b) the [facility] team listed 2. Log into [email or web address] and use [place]'s universal password to access. If you do not have the password, please contact the hospital operator. 3. Locate the [facility] provider you are looking for under [facility] and page to a number that you can be directly reached. 4. If you still have difficulty reaching the provider, please page the [facility] (Director on Call) for the Hospitalists listed on amion for assistance.   07/07/2019, 4:21 PM

## 2019-07-07 NOTE — Progress Notes (Signed)
[place] for Heparin Indication: DVT  Allergies  Allergen Reactions  . Penicillins Rash  . Morphine And Related Nausea And Vomiting    Patient Measurements: Height: 5\' 11"  (180.3 cm) Weight: 103.5 kg (228 lb 2.8 oz) IBW/kg (Calculated) : 70.8 Heparin Dosing Weight: 92.4 kg  Vital Signs: Temp: 98 F (36.7 C) (04/25 0639) Temp Source: Oral (04/25 0639) BP: 153/75 (04/25 1612) Pulse Rate: 90 (04/25 1612)  Labs: Recent Labs    07/07/19 0834 07/07/19 1624  HGB 13.3  --   HCT 41.2  --   PLT 163  --   LABPROT 13.6  --   INR 1.1  --   HEPARINUNFRC  --  1.68*  CREATININE 1.05*  --     Estimated Creatinine Clearance: 68.9 mL/min (A) (by C-G formula based on SCr of 1.05 mg/dL (H)).   Medical History: Past Medical History:  Diagnosis Date  . Allergy   . Arthritis   . Blood transfusion without reported diagnosis    1987  . Cancer (HCC)    Basal Cell Carcinoma  . Cataract   . Chronic kidney disease    kidney stone once  . H/O hiatal hernia   . History of basal cell carcinoma excision    NOSE  . History of benign bladder tumor   . History of DVT of lower extremity    11/ 2011  BILATERAL  POST FOOT SURGERY  . History of pulmonary embolus (PE)    12/ 2011   POST FOOT SURGERY  . Hyperlipidemia   . Left ureteral calculus   . Migraines   . OSA on CPAP    STUDY DONE 2012  . PONV (postoperative nausea and vomiting)    severe  . Sigmoid diverticulosis   . Wears glasses     Medications:  Scheduled:  . docusate sodium  100 mg Oral BID  . insulin aspart  0-15 Units Subcutaneous TID WC  . insulin aspart  0-5 Units Subcutaneous QHS  . montelukast  10 mg Oral QHS  . simvastatin  40 mg Oral QHS  . sodium chloride flush  3 mL Intravenous Q12H    Assessment: Patient is a 2 yof that presented to the ED with right foot pain. The patient has a hx of DVT/PE in the past and has been off Warfarin for ~ 5 years. Goal of Therapy:   Heparin level 0.3-0.7 units/ml Monitor platelets by anticoagulation protocol: Yes   Plan:  - Heparin level supra-therapeutic at 1.89 - Will hold heparin drip for 1 hour and then decrease Heparin to1200 units/hr - Heparin level in ~ 6 hours after restart - Monitor patient for s/s of bleeding and CBC while on heparin   Duanne Limerick PharmD. BCPS  07/07/2019,6:35 PM

## 2019-07-07 NOTE — Consult Note (Addendum)
[facility] [place]  Patient name: Barbara Thomas MRN: [id number] DOB: 1951/03/07 Sex: female   REQUESTING PROVIDER:    ER   REASON FOR CONSULT:    DVT  HISTORY OF PRESENT ILLNESS:   Barbara Thomas is a 69 y.o. female, who presented to the emergency department today with right leg pain and swelling.  She reports that she broke her toe a week ago and has not been very mobile.  She then drove to [facility] and back within a few days and spent a long time in the car.  She noticed that her leg was getting red and more swollen.  Ultrasound revealed DVT which was confirmed with CT scan.  She does have a prior history of DVT.  This occurred around the time of bilateral ankle fractures and immobility.  She did have a PE at that time and had a IVC filter placed.  It was supposed to be removed however she was discouraged from having this done.  She was on anticoagulation for approximately 4 years.  She was then evaluated by Dr. Marin Olp with hematology.  This was felt to be a provoked DVT and so her anticoagulation was discontinued.  She denies any chest pain or shortness of breath.  She is a non-smoker.  She also now has diabetes with a blood sugar of 427.  She takes a statin for hypercholesterolemia  PAST MEDICAL HISTORY    Past Medical History:  Diagnosis Date  . Allergy   . Arthritis   . Blood transfusion without reported diagnosis    1987  . Cancer (HCC)    Basal Cell Carcinoma  . Cataract   . Chronic kidney disease    kidney stone once  . H/O hiatal hernia   . History of basal cell carcinoma excision    NOSE  . History of benign bladder tumor   . History of DVT of lower extremity    11/ 2011  BILATERAL  POST FOOT SURGERY  . History of pulmonary embolus (PE)    12/ 2011   POST FOOT SURGERY  . Hyperlipidemia   . Left ureteral calculus   . Migraines   . OSA on CPAP    STUDY DONE 2012  . PONV (postoperative nausea and  vomiting)    severe  . Sigmoid diverticulosis   . Wears glasses      FAMILY HISTORY   Family History  Problem Relation Age of Onset  . Transient ischemic attack Mother   . Hypertension Mother   . Dementia Mother   . Atrial fibrillation Mother   . Cancer Father        BLADDER CANCER  . Hypertension Father   . Stroke Paternal Grandmother   . Sudden death Maternal Grandmother   . Diabetes Brother   . Colon cancer Neg Hx   . Colon polyps Neg Hx   . Esophageal cancer Neg Hx   . Rectal cancer Neg Hx   . Stomach cancer Neg Hx   . Clotting disorder Neg Hx     SOCIAL HISTORY:   Social History   Socioeconomic History  . Marital status: Married    Spouse name: Not on file  . Number of children: Not on file  . Years of education: Not on file  . Highest education level: Not on file  Occupational History  . Occupation: housewife  Tobacco Use  . Smoking status: Never Smoker  . Smokeless tobacco: Never Used  Substance and Sexual  Activity  . Alcohol use: No  . Drug use: No  . Sexual activity: Not on file  Other Topics Concern  . Not on file  Social History Narrative   Exercise--- walking--- qd short distance   Social Determinants of Health   Financial Resource Strain:   . Difficulty of Paying Living Expenses:   Food Insecurity:   . Worried About Charity fundraiser in the Last Year:   . Arboriculturist in the Last Year:   Transportation Needs:   . Film/video editor (Medical):   Marland Kitchen Lack of Transportation (Non-Medical):   Physical Activity:   . Days of Exercise per Week:   . Minutes of Exercise per Session:   Stress:   . Feeling of Stress :   Social Connections:   . Frequency of Communication with Friends and Family:   . Frequency of Social Gatherings with Friends and Family:   . Attends Religious Services:   . Active Member of Clubs or Organizations:   . Attends Archivist Meetings:   Marland Kitchen Marital Status:   Intimate Partner Violence:   . Fear of  Current or Ex-Partner:   . Emotionally Abused:   Marland Kitchen Physically Abused:   . Sexually Abused:     ALLERGIES:    Allergies  Allergen Reactions  . Penicillins Rash  . Morphine And Related Nausea And Vomiting    CURRENT MEDICATIONS:    Current Facility-Administered Medications  Medication Dose Route Frequency Provider Last Rate Last Admin  . acetaminophen (TYLENOL) tablet 650 mg  650 mg Oral Q6H PRN Karmen Bongo, MD       Or  . acetaminophen (TYLENOL) suppository 650 mg  650 mg Rectal Q6H PRN Karmen Bongo, MD      . bisacodyl (DULCOLAX) EC tablet 5 mg  5 mg Oral Daily PRN Karmen Bongo, MD      . docusate sodium (COLACE) capsule 100 mg  100 mg Oral BID Karmen Bongo, MD      . heparin ADULT infusion 100 units/mL (25000 units/225mL sodium chloride 0.45%)  1,200 Units/hr Intravenous Continuous Duanne Limerick, [facility] 12 mL/hr at 07/07/19 2034 1,200 Units/hr at 07/07/19 2034  . hydrALAZINE (APRESOLINE) injection 5 mg  5 mg Intravenous Q4H PRN Karmen Bongo, MD      . HYDROcodone-acetaminophen (NORCO/VICODIN) 5-325 MG per tablet 1-2 tablet  1-2 tablet Oral Q4H PRN Karmen Bongo, MD      . insulin aspart (novoLOG) injection 0-15 Units  0-15 Units Subcutaneous TID WC Karmen Bongo, MD   15 Units at 07/07/19 1802  . insulin aspart (novoLOG) injection 0-5 Units  0-5 Units Subcutaneous QHS Karmen Bongo, MD      . lactated ringers infusion   Intravenous Continuous Karmen Bongo, MD   Stopped at 07/07/19 2038  . living well with diabetes book MISC   Does not apply Once Karmen Bongo, MD      . montelukast (SINGULAIR) tablet 10 mg  10 mg Oral Ivery Quale, MD      . ondansetron [facility]) tablet 4 mg  4 mg Oral Q6H PRN Karmen Bongo, MD       Or  . ondansetron [facility]) injection 4 mg  4 mg Intravenous Q6H PRN Karmen Bongo, MD      . polyethylene glycol (MIRALAX / GLYCOLAX) packet 17 g  17 g Oral Daily PRN Karmen Bongo, MD      . simvastatin (ZOCOR) tablet 40 mg  40  mg Oral QHS Karmen Bongo,  MD      . sodium chloride flush (NS) 0.9 % injection 3 mL  3 mL Intravenous Q12H Karmen Bongo, MD   3 mL at 07/07/19 1628  . zolpidem (AMBIEN) tablet 5 mg  5 mg Oral QHS PRN Karmen Bongo, MD        REVIEW OF SYSTEMS:   [X]  denotes positive finding, [ ]  denotes negative finding Cardiac  Comments:  Chest pain or chest pressure:    Shortness of breath upon exertion:    Short of breath when lying flat:    Irregular heart rhythm:        Vascular    Pain in calf, thigh, or hip brought on by ambulation:    Pain in feet at night that wakes you up from your sleep:     Blood clot in your veins:    Leg swelling:  x       Pulmonary    Oxygen at home:    Productive cough:     Wheezing:         Neurologic    Sudden weakness in arms or legs:     Sudden numbness in arms or legs:     Sudden onset of difficulty speaking or slurred speech:    Temporary loss of vision in one eye:     Problems with dizziness:         Gastrointestinal    Blood in stool:      Vomited blood:         Genitourinary    Burning when urinating:     Blood in urine:        Psychiatric    Major depression:         Hematologic    Bleeding problems:    Problems with blood clotting too easily:        Skin    Rashes or ulcers:        Constitutional    Fever or chills:     PHYSICAL EXAM:   Vitals:   07/07/19 1345 07/07/19 1400 07/07/19 1612 07/07/19 2042  BP: (!) 144/77 (!) 148/72 (!) 153/75 (!) 141/72  Pulse: 86 81 90 95  Resp:   18 16  Temp:    97.9 F (36.6 C)  TempSrc:    Oral  SpO2: 100% 99% 97% 95%  Weight:   103.5 kg   Height:   5\' 11"  (1.803 m)     GENERAL: The patient is a well-nourished female, in no acute distress. The vital signs are documented above. CARDIAC: There is a regular rate and rhythm.  VASCULAR: Bilateral lower extremity pitting edema with erythema on the right leg PULMONARY: Nonlabored respirations ABDOMEN: Soft and non-tender with normal  pitched bowel sounds.  MUSCULOSKELETAL: There are no major deformities or cyanosis. NEUROLOGIC: No focal weakness or paresthesias are detected. SKIN: There are no ulcers or rashes noted. PSYCHIATRIC: The patient has a normal affect.  STUDIES:   I have reviewed the following:  CT: 1. There is a Recovery type IVC filter in the infrarenal IVC. The IVC at the level of the filter and inferiorly and bilateral common iliac veins, as well as included portions of the right internal and external iliac and superior portions of the right femoral veins are somewhat expansile appearing and non-opacified on 120 second delayed phase imaging. Findings are consistent with acute appearing central venous and deep venous thrombosis of the IVC, bilateral common iliac veins, and the included more distal right iliac  and femoral veins.  2.  Hepatic steatosis.  3.  Aortic Atherosclerosis (ICD10-I70.0).  DUPLEX:  RIGHT:  - Findings consistent with acute deep vein thrombosis involving the right  common femoral vein, SF junction, right femoral vein, right proximal  profunda vein, right popliteal vein, right posterior tibial veins, right  peroneal veins, and right  gastrocnemius veins.    LEFT:  - No evidence of common femoral vein obstruction.     IVC: IVC/Iliac: There is evidence of acute thrombus involving the IVC. There is  evidence of acute thrombus involving the right common iliac vein. There is  evidence of acute thrombus involving the left common iliac vein. There is  evidence of acute thrombus  involving the right external iliac vein. There is evidence of acute  thrombus involving the left external iliac vein.   ASSESSMENT and PLAN   Extensive bilateral lower extremity DVT right greater than left with extension up to her inferior vena cava filter: I discussed with the patient that due to the extent of her DVT I would recommend intervention.  I discussed that I would likely try to remove  her filter.  We will began with bilateral popliteal vein access and placement of bilateral lytic catheters and run her on TPA infusion for 24 hours and then go to the operating room for mechanical thrombectomy likely using the AngioJet device.  I will also likely try and remove her filter at that time.  She does not have any contraindications for lytic therapy.  I will try to begin this process tomorrow however may be delayed until Tuesday.  Please continue her heparin drip.  This does not need to be discontinued for her procedure.   Leia Alf, MD, FACS [facility] [phone] Pager [phone]

## 2019-07-07 NOTE — ED Triage Notes (Addendum)
Pt c/o severe RLE pain onset yesterday,worse today. Swelling from knee to foot. Pt returned to night from [place] via car. Pt does boot on R foot from recent broken foot. Pt does IVC filter in place from prior fractures and massive clotting in legs, lungs, not currently on blood thinners.

## 2019-07-07 NOTE — ED Provider Notes (Signed)
[facility] EMERGENCY DEPARTMENT Provider Note   CSN: [id number] Arrival date & time: 07/07/19  0119     History Chief Complaint  Patient presents with  . Leg Pain    Barbara Thomas is a 69 y.o. female.  Pt presents to the ED with pain to the right foot.  Pt broke her small toe on Sunday, April 18th.  She did not get an x-ray because she thought it was just a toe fx.  She drove to a wedding in [place], [place] on 4/22.  She said her right leg started hurting a lot while there and then she came back yesterday and drove all night and came straight here.  She has been waiting for several hrs in the waiting room.  Pt has a hx of DVT and PE after having bilateral ankle fractures several years ago.  The pt has been off coumadin for at least 5 years.  Per Epic chart review, she was taken off in 2014 after consultation with Dr. Marin Olp.  Pt also notes that she's had urinary frequency.        Past Medical History:  Diagnosis Date  . Allergy   . Arthritis   . Blood transfusion without reported diagnosis    1987  . Cancer (HCC)    Basal Cell Carcinoma  . Cataract   . Chronic kidney disease    kidney stone once  . H/O hiatal hernia   . History of basal cell carcinoma excision    NOSE  . History of benign bladder tumor   . History of DVT of lower extremity    11/ 2011  BILATERAL  POST FOOT SURGERY  . History of pulmonary embolus (PE)    12/ 2011   POST FOOT SURGERY  . Hyperlipidemia   . Left ureteral calculus   . Migraines   . OSA on CPAP    STUDY DONE 2012  . PONV (postoperative nausea and vomiting)    severe  . Sigmoid diverticulosis   . Wears glasses     Patient Active Problem List   Diagnosis Date Noted  . Hyperglycemia 11/27/2017  . Hyperlipidemia LDL goal <100 11/27/2017  . Seasonal allergic rhinitis due to pollen 11/27/2017  . OSA (obstructive sleep apnea) 05/22/2014  . Ureteral calculus, left 11/25/2013  . Obesity (BMI 30-39.9) 02/05/2013  .  Pulmonary embolism ([place]) 06/09/2011  . GERD (gastroesophageal reflux disease) 06/03/2011  . DVT of lower extremity, bilateral ([place]) 01/10/2011  . Contusion, breast 01/10/2011    Past Surgical History:  Procedure Laterality Date  . BENIGN RIGHT BREAST BX  12-06-2010  . COLONOSCOPY    . CYSTOSCOPY WITH RETROGRADE PYELOGRAM, URETEROSCOPY AND STENT PLACEMENT Left 11/25/2013   Procedure: CYSTOSCOPY WITH RETROGRADE PYELOGRAM, URETEROSCOPY AND STENT PLACEMENT WITH COLD CUP RESECTION OF BLADDER TUMOR ;  Surgeon: Bernestine Amass, MD;  Location[facility];  Service: Urology;  Laterality: Left;  . HIP PINNING Left 1985  . HOLMIUM LASER APPLICATION Left [id number]   Procedure: HOLMIUM LASER APPLICATION;  Surgeon: Bernestine Amass, MD;  Location: [facility];  Service: Urology;  Laterality: Left;  . INCONTINENCE SURGERY    . INSERTION OF VENA CAVA FILTER  12/ 2011   ECLIPSE  . ORIF LEFT ANKLE FX  11/ 2011  . PARTIAL HIP ARTHROPLASTY Left 1987  . REVISION HIP HEMIARTHROPLASTY  Left 2004  . VAGINAL HYSTERECTOMY  2005   W/  BILATERAL SALPINGOOPHORECTOMY AND BLADDER SLING PROCEDURE  OB History   No obstetric history on file.     Family History  Problem Relation Age of Onset  . Transient ischemic attack Mother   . Hypertension Mother   . Dementia Mother   . Cancer Father        BLADDER CANCER  . Hypertension Father   . Stroke Paternal Grandmother   . Sudden death Maternal Grandmother   . Diabetes Brother   . Colon cancer Neg Hx   . Colon polyps Neg Hx   . Esophageal cancer Neg Hx   . Rectal cancer Neg Hx   . Stomach cancer Neg Hx     Social History   Tobacco Use  . Smoking status: Never Smoker  . Smokeless tobacco: Never Used  Substance Use Topics  . Alcohol use: No  . Drug use: No    Home Medications Prior to Admission medications   Medication Sig Start Date End Date Taking? Authorizing Provider  aspirin 81 MG chewable tablet Chew 162 mg by  mouth daily.    [provider]  CALCIUM PO Take by mouth.    [provider]  cholecalciferol (VITAMIN D) 1000 units tablet Take 1 tablet (1,000 Units total) by mouth daily. 01/05/17   Roma Schanz R, DO  co-enzyme Q-10 30 MG capsule Take 30 mg by mouth daily.    [provider]  glucosamine-chondroitin 500-400 MG tablet Take 1 tablet by mouth 3 (three) times daily.    [provider]  montelukast (SINGULAIR) 10 MG tablet TAKE 1 TABLET AT BEDTIME 06/12/19   Ann Held, DO  Multiple Vitamin (MULTIVITAMIN) tablet Take 1 tablet by mouth daily.      [provider]  Omega-3 Fatty Acids (FISH OIL PO) Take by mouth.    [provider]  simvastatin (ZOCOR) 40 MG tablet TAKE 1 TABLET AT BEDTIME 06/12/19   Roma Schanz R, DO    Allergies    Penicillins and Morphine and related  Review of Systems   Review of Systems  Musculoskeletal:       Right foot and leg pain  All other systems reviewed and are negative.   Physical Exam Updated Vital Signs BP (!) 137/58   Pulse 83   Temp 98 F (36.7 C) (Oral)   Resp 16   Ht 5\' 11"  (1.803 m)   Wt 101.6 kg   SpO2 95%   BMI 31.24 kg/m   Physical Exam Vitals and nursing note reviewed.  Constitutional:      Appearance: Normal appearance.  HENT:     Head: Normocephalic and atraumatic.     Right Ear: External ear normal.     Left Ear: External ear normal.     Nose: Nose normal.     Mouth/Throat:     Mouth: Mucous membranes are moist.     Pharynx: Oropharynx is clear.  Eyes:     Extraocular Movements: Extraocular movements intact.     Conjunctiva/sclera: Conjunctivae normal.     Pupils: Pupils are equal, round, and reactive to light.  Cardiovascular:     Rate and Rhythm: Normal rate and regular rhythm.     Pulses: Normal pulses.     Heart sounds: Normal heart sounds.  Pulmonary:     Effort: Pulmonary effort is normal.     Breath sounds: Normal breath sounds.    Abdominal:     General: Abdomen is flat. Bowel sounds are normal.     Palpations: Abdomen is soft.  Musculoskeletal:     Cervical back: Normal range of motion and neck supple.       Legs:  Skin:    General: Skin is warm.     Capillary Refill: Capillary refill takes less than 2 seconds.  Neurological:     General: No focal deficit present.     Mental Status: She is alert and oriented to person, place, and time.  Psychiatric:        Mood and Affect: Mood normal.        Behavior: Behavior normal.        Thought Content: Thought content normal.        Judgment: Judgment normal.     ED Results / Procedures / Treatments   Labs (all labs ordered are listed, but only abnormal results are displayed) Labs Reviewed  COMPREHENSIVE METABOLIC PANEL - Abnormal; Notable for the following components:      Result Value   Sodium 133 (*)    Chloride 97 (*)    Glucose, Bld 427 (*)    Creatinine, Ser 1.05 (*)    GFR calc non Af Amer 55 (*)    All other components within normal limits  URINALYSIS, ROUTINE W REFLEX MICROSCOPIC - Abnormal; Notable for the following components:   Specific Gravity, Urine 1.038 (*)    Glucose, UA >=500 (*)    Hgb urine dipstick SMALL (*)    Ketones, ur 20 (*)    Leukocytes,Ua SMALL (*)    All other components within normal limits  CBC WITH DIFFERENTIAL/PLATELET  PROTIME-INR  HEPARIN LEVEL (UNFRACTIONATED)  CBG MONITORING, ED  CBG MONITORING, ED    EKG None  Radiology VAS [place] IVC/ILIAC (VENOUS ONLY)  Result Date: 07/07/2019 IVC/ILIAC STUDY Indications: Acute DVT throughout right lower extremity Other Factors: Lupus, bladder cancer with resection and stenting. History of DVT                and PE. Vascular Interventions: History of IVC filter placed in [place] in 2012 post PE. Limitations: Air/bowel gas and body habitus.  Comparison Study: No prior study on file Performing Technologist: Sharion Dove RVS Supporting Technologist: Maudry Mayhew RDMS, RVT,  RDCS  Examination Guidelines: A complete evaluation includes B-mode imaging, spectral Doppler, color Doppler, and power Doppler as needed of all accessible portions of each vessel. Bilateral testing is considered an integral part of a complete examination. Limited examinations for reoccurring indications may be performed as noted.  IVC/Iliac Findings: +--------+------+--------+--------+   IVC   PatentThrombusComments +--------+------+--------+--------+ IVC Prox       acute           +--------+------+--------+--------+  +-------------------+---------+-----------+---------+-----------+--------+         CIV        RT-PatentRT-ThrombusLT-PatentLT-ThrombusComments +-------------------+---------+-----------+---------+-----------+--------+ Common Iliac Prox              acute                                +-------------------+---------+-----------+---------+-----------+--------+ Common Iliac Mid               acute                                +-------------------+---------+-----------+---------+-----------+--------+ Common Iliac Distal            acute               acute            +-------------------+---------+-----------+---------+-----------+--------+  +-------------------------+---------+-----------+---------+-----------+--------+  EIV           RT-PatentRT-ThrombusLT-PatentLT-ThrombusComments +-------------------------+---------+-----------+---------+-----------+--------+ External Iliac Vein Prox             acute               acute            +-------------------------+---------+-----------+---------+-----------+--------+ External Iliac Vein Mid              acute               acute            +-------------------------+---------+-----------+---------+-----------+--------+ External Iliac Vein                  acute               acute            Distal                                                                     +-------------------------+---------+-----------+---------+-----------+--------+   Summary: IVC/Iliac: There is evidence of acute thrombus involving the IVC. There is evidence of acute thrombus involving the right common iliac vein. There is evidence of acute thrombus involving the left common iliac vein. There is evidence of acute thrombus involving the right external iliac vein. There is evidence of acute thrombus involving the left external iliac vein.  *See table(s) above for measurements and observations.  Electronically signed by Harold Barban MD on 07/07/2019 at 11:39:53 AM.    Final    DG Foot Complete Right  Result Date: 07/07/2019 CLINICAL DATA:  Pain, fractured small toe a week ago, increased pain and swelling EXAM: RIGHT FOOT COMPLETE - 3+ VIEW COMPARISON:  None. FINDINGS: Suspect a very subtle nondisplaced fracture of the base of the right fifth proximal phalanx. No other evident fracture. Joint spaces are preserved. Diffuse soft tissue edema about the forefoot. IMPRESSION: 1. Suspect a very subtle nondisplaced fracture of the base of the right fifth proximal phalanx, in keeping with reported fracture. 2.  Diffuse soft tissue edema about the forefoot. Electronically Signed   By: Eddie Candle M.D.   On: 07/07/2019 10:42   VAS [place] LOWER EXTREMITY VENOUS (DVT) (ONLY [facility] & [facility])  Result Date: 07/07/2019  Lower Venous DVTStudy Indications: Pain, Swelling, and History of DVT. Recent car trip to [place].  Risk Factors: Cancer bladder cancer with stenting and resection in 2015 DVT Bilateral femoral veins in 2012 lupus, IVC filter placed in 01/2011 post PE, in [place]. Comparison Study: Prior study from 02/23/13 is available for comparison Performing Technologist: Sharion Dove RVS  Examination Guidelines: A complete evaluation includes B-mode imaging, spectral Doppler, color Doppler, and power Doppler as needed of all accessible portions of each vessel. Bilateral testing is considered an integral part  of a complete examination. Limited examinations for reoccurring indications may be performed as noted. The reflux portion of the exam is performed with the patient in reverse Trendelenburg.  +---------+---------------+---------+-----------+----------+--------------+ RIGHT    CompressibilityPhasicitySpontaneityPropertiesThrombus Aging +---------+---------------+---------+-----------+----------+--------------+ CFV      None           No       No  Acute          +---------+---------------+---------+-----------+----------+--------------+ SFJ      None                                         Acute          +---------+---------------+---------+-----------+----------+--------------+ FV Prox  None                                         Acute          +---------+---------------+---------+-----------+----------+--------------+ FV Mid   None                                         Acute          +---------+---------------+---------+-----------+----------+--------------+ FV DistalNone                                         Acute          +---------+---------------+---------+-----------+----------+--------------+ PFV      None                                         Acute          +---------+---------------+---------+-----------+----------+--------------+ POP      None           No       No                   Acute          +---------+---------------+---------+-----------+----------+--------------+ PTV      None                                         Acute          +---------+---------------+---------+-----------+----------+--------------+ PERO     None                                         Acute          +---------+---------------+---------+-----------+----------+--------------+ Gastroc  None                                         Acute          +---------+---------------+---------+-----------+----------+--------------+ GSV       Full                                                        +---------+---------------+---------+-----------+----------+--------------+   +----+---------------+---------+-----------+----------+--------------+ LEFTCompressibilityPhasicitySpontaneityPropertiesThrombus Aging +----+---------------+---------+-----------+----------+--------------+ CFV Full           No  No                   sluggish flow  +----+---------------+---------+-----------+----------+--------------+     Summary: RIGHT: - Findings consistent with acute deep vein thrombosis involving the right common femoral vein, SF junction, right femoral vein, right proximal profunda vein, right popliteal vein, right posterior tibial veins, right peroneal veins, and right gastrocnemius veins.  LEFT: - No evidence of common femoral vein obstruction.  *See table(s) above for measurements and observations. Electronically signed by Harold Barban MD on 07/07/2019 at 11:40:10 AM.    Final    CT Angio Abd/Pel W and/or Wo Contrast  Result Date: 07/07/2019 CLINICAL DATA:  Leg vein DVT suspected EXAM: CTA ABDOMEN AND PELVIS WITHOUT AND WITH CONTRAST TECHNIQUE: Multidetector CT imaging of the abdomen and pelvis was performed using the standard protocol during bolus administration of intravenous contrast. Multiplanar reconstructed images and MIPs were obtained and reviewed to evaluate the vascular anatomy. CONTRAST:  131mL OMNIPAQUE IOHEXOL 350 MG/ML SOLN COMPARISON:  11/21/2013 FINDINGS: VASCULAR Scattered aortic atherosclerosis. Normal contour and caliber of the abdominal aorta. Standard branching pattern of the abdominal aorta with solitary bilateral renal arteries. There is a Recovery type IVC filter in the infrarenal IVC. The IVC at the level of the filter and inferiorly and bilateral common iliac veins, as well as included portions of the right internal and external iliac and superior portions of the right femoral veins are somewhat  expansile appearing and non-opacified on 120 second delayed phase imaging. Review of the MIP images confirms the above findings. NON-VASCULAR Lower chest: No acute abnormality. Hepatobiliary: No solid liver abnormality is seen. Hepatic steatosis. No gallstones, gallbladder wall thickening, or biliary dilatation. Pancreas: Unremarkable. No pancreatic ductal dilatation or surrounding inflammatory changes. Spleen: Normal in size without significant abnormality. Adrenals/Urinary Tract: Adrenal glands are unremarkable. Kidneys are normal, without renal calculi, solid lesion, or hydronephrosis. Bladder is unremarkable. Stomach/Bowel: Stomach is within normal limits. Appendix appears normal. No evidence of bowel wall thickening, distention, or inflammatory changes. Lymphatic:  No enlarged abdominal or pelvic lymph nodes. Reproductive: Status post hysterectomy. Other: No abdominal wall hernia or abnormality. No abdominopelvic ascites. Musculoskeletal: No acute or significant osseous findings. IMPRESSION: 1. There is a Recovery type IVC filter in the infrarenal IVC. The IVC at the level of the filter and inferiorly and bilateral common iliac veins, as well as included portions of the right internal and external iliac and superior portions of the right femoral veins are somewhat expansile appearing and non-opacified on 120 second delayed phase imaging. Findings are consistent with acute appearing central venous and deep venous thrombosis of the IVC, bilateral common iliac veins, and the included more distal right iliac and femoral veins. 2.  Hepatic steatosis. 3.  Aortic Atherosclerosis (ICD10-I70.0). Electronically Signed   By: Eddie Candle M.D.   On: 07/07/2019 11:49    Procedures Procedures (including critical care time)  Medications Ordered in ED Medications  heparin ADULT infusion 100 units/mL (25000 units/243mL sodium chloride 0.45%) (1,550 Units/hr Intravenous New Bag/Given 07/07/19 1103)  insulin aspart  (novoLOG) injection 8 Units (has no administration in time range)  ketorolac (TORADOL) 30 MG/ML injection 30 mg (30 mg Intravenous Given 07/07/19 0832)  fentaNYL (SUBLIMAZE) injection 50 mcg (50 mcg Intravenous Given 07/07/19 1059)  ondansetron (ZOFRAN) injection 4 mg (4 mg Intravenous Given 07/07/19 1059)  iohexol (OMNIPAQUE) 350 MG/ML injection 100 mL (100 mLs Intravenous Contrast Given 07/07/19 1027)  heparin bolus via infusion 6,400 Units (6,400 Units Intravenous Bolus from  Bag 07/07/19 1103)    ED Course  I have reviewed the triage vital signs and the nursing notes.  Pertinent labs & imaging results that were available during my care of the patient were reviewed by me and considered in my medical decision making (see chart for details).    MDM Rules/Calculators/A&P                      Pt d/w Dr. Trula Slade (vascular) who recommended a CT abd/pelvis with venous phase and heparin.  Due to the significant clot burden, he recommends admission.    Pt's blood sugar is also 427.  Prior to today, she has not had a dx of DM.  She is not in DKA.  Pt given IV insulin.  Covid swab pending, but she's had both doses of the Moderna vaccine.  Pt d/w Dr. Lorin Mercy (triad) for admission.  CRITICAL CARE Performed by: Isla Pence   Total critical care time: 30 minutes  Critical care time was exclusive of separately billable procedures and treating other patients.  Critical care was necessary to treat or prevent imminent or life-threatening deterioration.  Critical care was time spent personally by me on the following activities: development of treatment plan with patient and/or surrogate as well as nursing, discussions with consultants, evaluation of patient's response to treatment, examination of patient, obtaining history from patient or surrogate, ordering and performing treatments and interventions, ordering and review of laboratory studies, ordering and review of radiographic studies, pulse oximetry  and re-evaluation of patient's condition.  Final Clinical Impression(s) / ED Diagnoses Final diagnoses:  Acute deep vein thrombosis (DVT) of other specified vein of right lower extremity ([place])  Hyperglycemia    Rx / DC Orders ED Discharge Orders    None       Isla Pence, MD 07/07/19 1336

## 2019-07-07 NOTE — Progress Notes (Signed)
[facility] for Heparin Indication: DVT  Allergies  Allergen Reactions  . Penicillins Rash  . Morphine And Related Nausea And Vomiting    Patient Measurements: Height: 5\' 11"  (180.3 cm) Weight: 101.6 kg (224 lb) IBW/kg (Calculated) : 70.8 Heparin Dosing Weight: 92.4 kg  Vital Signs: Temp: 98 F (36.7 C) (04/25 0639) Temp Source: Oral (04/25 0639) BP: 142/69 (04/25 1002) Pulse Rate: 88 (04/25 1002)  Labs: Recent Labs    07/07/19 0834  HGB 13.3  HCT 41.2  PLT 163  LABPROT 13.6  INR 1.1  CREATININE 1.05*    Estimated Creatinine Clearance: 68.2 mL/min (A) (by C-G formula based on SCr of 1.05 mg/dL (H)).   Medical History: Past Medical History:  Diagnosis Date  . Allergy   . Arthritis   . Blood transfusion without reported diagnosis    1987  . Cancer (HCC)    Basal Cell Carcinoma  . Cataract   . Chronic kidney disease    kidney stone once  . H/O hiatal hernia   . History of basal cell carcinoma excision    NOSE  . History of benign bladder tumor   . History of DVT of lower extremity    11/ 2011  BILATERAL  POST FOOT SURGERY  . History of pulmonary embolus (PE)    12/ 2011   POST FOOT SURGERY  . Hyperlipidemia   . Left ureteral calculus   . Migraines   . OSA on CPAP    STUDY DONE 2012  . PONV (postoperative nausea and vomiting)    severe  . Sigmoid diverticulosis   . Sleep apnea    CPAP  . Wears glasses     Medications:  Scheduled:  . fentaNYL (SUBLIMAZE) injection  50 mcg Intravenous Once  . heparin  6,400 Units Intravenous Once  . ondansetron (ZOFRAN) IV  4 mg Intravenous Once    Assessment: Patient is a 69 yof that presented to the ED with right foot pain. The patient has a hx of DVT/PE in the past and has been off Warfarin for ~ 5 years. Goal of Therapy:  Heparin level 0.3-0.7 units/ml Monitor platelets by anticoagulation protocol: Yes   Plan:  - Heparin bolus 6400 units IV x 1 dose - Heparin drip  @ 1550 units/hr - Heparin level in ~ 6 hours  - Monitor patient for s/s of bleeding and CBC while on heparin   Duanne Limerick PharmD. BCPS  07/07/2019,10:34 AM

## 2019-07-07 NOTE — ED Notes (Signed)
Pt transported to vascular.  °

## 2019-07-08 ENCOUNTER — Encounter (HOSPITAL_COMMUNITY)
Admission: EM | Disposition: A | Discharge status: Home Health | Payer: Self-pay | Source: Home / Self Care | Attending: Internal Medicine

## 2019-07-08 DIAGNOSIS — J9601 Acute respiratory failure with hypoxia: Secondary | ICD-10-CM | POA: Diagnosis not present

## 2019-07-08 DIAGNOSIS — R110 Nausea: Secondary | ICD-10-CM | POA: Diagnosis not present

## 2019-07-08 HISTORY — PX: IVC VENOGRAPHY: CATH118301

## 2019-07-08 LAB — CBC
HCT: 42.9 % (ref 36.0–46.0)
HCT: 39.4 % (ref 36.0–46.0)
Hemoglobin: 13.9 g/dL (ref 12.0–15.0)
Hemoglobin: 12.6 g/dL (ref 12.0–15.0)
MCH: 28.5 pg (ref 26.0–34.0)
MCH: 28.1 pg (ref 26.0–34.0)
MCHC: 32.4 g/dL (ref 30.0–36.0)
MCHC: 32 g/dL (ref 30.0–36.0)
MCV: 87.9 fL (ref 80.0–100.0)
MCV: 87.9 fL (ref 80.0–100.0)
Platelets: 151 10*3/uL (ref 150–400)
Platelets: 135 10*3/uL — ABNORMAL LOW (ref 150–400)
RBC: 4.88 MIL/uL (ref 3.87–5.11)
RBC: 4.48 MIL/uL (ref 3.87–5.11)
RDW: 13.4 % (ref 11.5–15.5)
RDW: 13.3 % (ref 11.5–15.5)
WBC: 10.7 10*3/uL — ABNORMAL HIGH (ref 4.0–10.5)
WBC: 10.4 10*3/uL (ref 4.0–10.5)
nRBC: 0 % (ref 0.0–0.2)
nRBC: 0 % (ref 0.0–0.2)

## 2019-07-08 LAB — BASIC METABOLIC PANEL
Anion gap: 13 (ref 5–15)
BUN: 27 mg/dL — ABNORMAL HIGH (ref 8–23)
CO2: 25 mmol/L (ref 22–32)
Calcium: 9.4 mg/dL (ref 8.9–10.3)
Chloride: 97 mmol/L — ABNORMAL LOW (ref 98–111)
Creatinine, Ser: 1.06 mg/dL — ABNORMAL HIGH (ref 0.44–1.00)
GFR calc Af Amer: >60 mL/min (ref >60)
GFR calc non Af Amer: 54 mL/min — ABNORMAL LOW (ref >60)
Glucose, Bld: 251 mg/dL — ABNORMAL HIGH (ref 70–99)
Potassium: 3.9 mmol/L (ref 3.5–5.1)
Sodium: 135 mmol/L (ref 135–145)

## 2019-07-08 LAB — GLUCOSE, CAPILLARY
Glucose-Capillary: 252 mg/dL — ABNORMAL HIGH (ref 70–99)
Glucose-Capillary: 242 mg/dL — ABNORMAL HIGH (ref 70–99)
Glucose-Capillary: 269 mg/dL — ABNORMAL HIGH (ref 70–99)

## 2019-07-08 LAB — MRSA PCR SCREENING: MRSA by PCR: NEGATIVE

## 2019-07-08 LAB — HEPARIN LEVEL (UNFRACTIONATED)
Heparin Unfractionated: 1.58 IU/mL — ABNORMAL HIGH (ref 0.30–0.70)
Heparin Unfractionated: 1.08 IU/mL — ABNORMAL HIGH (ref 0.30–0.70)
Heparin Unfractionated: 0.78 IU/mL — ABNORMAL HIGH (ref 0.30–0.70)
Heparin Unfractionated: 0.44 IU/mL (ref 0.30–0.70)

## 2019-07-08 LAB — PROTIME-INR
INR: 1.1 (ref 0.8–1.2)
Prothrombin Time: 13.7 seconds (ref 11.4–15.2)

## 2019-07-08 LAB — FIBRINOGEN: Fibrinogen: 328 mg/dL (ref 210–475)

## 2019-07-08 SURGERY — IVC VENOGRAPHY
Anesthesia: LOCAL

## 2019-07-08 MED ORDER — SODIUM CHLORIDE 0.9 % IV SOLN: INTRAVENOUS | Status: DC

## 2019-07-08 MED ORDER — METOPROLOL TARTRATE 5 MG/5ML IV SOLN: 2.0000 mg | INTRAVENOUS | Status: DC | PRN

## 2019-07-08 MED ORDER — MIDAZOLAM HCL 2 MG/2ML IJ SOLN
INTRAMUSCULAR | Status: AC
  Filled 2019-07-08: qty 2

## 2019-07-08 MED ORDER — SODIUM CHLORIDE 0.9 % IV SOLN
250.0000 mL | INTRAVENOUS | Status: DC | PRN
  Administered 2019-07-10: 250 mL via INTRAVENOUS

## 2019-07-08 MED ORDER — HEPARIN (PORCINE) IN NACL 1000-0.9 UT/500ML-% IV SOLN
INTRAVENOUS | Status: AC
  Filled 2019-07-08: qty 500

## 2019-07-08 MED ORDER — HEPARIN (PORCINE) 25000 UT/250ML-% IV SOLN: 850.0000 [IU]/h | INTRAVENOUS | Status: DC

## 2019-07-08 MED ORDER — LIDOCAINE HCL (PF) 1 % IJ SOLN
INTRAMUSCULAR | Status: DC | PRN
  Administered 2019-07-08 (×2): 5 mL

## 2019-07-08 MED ORDER — FENTANYL CITRATE (PF) 100 MCG/2ML IJ SOLN
INTRAMUSCULAR | Status: DC | PRN
  Administered 2019-07-08: 50 ug via INTRAVENOUS

## 2019-07-08 MED ORDER — HEPARIN (PORCINE) IN NACL 1000-0.9 UT/500ML-% IV SOLN
INTRAVENOUS | Status: DC | PRN
  Administered 2019-07-08: 500 mL

## 2019-07-08 MED ORDER — CHLORHEXIDINE GLUCONATE CLOTH 2 % EX PADS
6.0000 | MEDICATED_PAD | Freq: Every day | CUTANEOUS | Status: DC
  Administered 2019-07-08 – 2019-07-13 (×5): 6 via TOPICAL

## 2019-07-08 MED ORDER — IODIXANOL 320 MG/ML IV SOLN
INTRAVENOUS | Status: DC | PRN
  Administered 2019-07-08: 35 mL via INTRAVENOUS

## 2019-07-08 MED ORDER — SODIUM CHLORIDE 0.9% FLUSH: 3.0000 mL | INTRAVENOUS | Status: DC | PRN

## 2019-07-08 MED ORDER — PHENOL 1.4 % MT LIQD
1.0000 | OROMUCOSAL | Status: DC | PRN
  Filled 2019-07-08: qty 177

## 2019-07-08 MED ORDER — INSULIN GLARGINE 100 UNIT/ML ~~LOC~~ SOLN
10.0000 [IU] | Freq: Every day | SUBCUTANEOUS | Status: DC
  Administered 2019-07-08 – 2019-07-09 (×2): 10 [IU] via SUBCUTANEOUS
  Filled 2019-07-08 (×4): qty 0.1

## 2019-07-08 MED ORDER — OXYMETAZOLINE HCL 0.05 % NA SOLN
1.0000 | Freq: Two times a day (BID) | NASAL | Status: AC
  Administered 2019-07-08 – 2019-07-10 (×4): 1 via NASAL
  Filled 2019-07-08 (×2): qty 30

## 2019-07-08 MED ORDER — CYCLOBENZAPRINE HCL 10 MG PO TABS
5.0000 mg | ORAL_TABLET | Freq: Two times a day (BID) | ORAL | Status: DC | PRN
  Filled 2019-07-08: qty 1

## 2019-07-08 MED ORDER — MIDAZOLAM HCL 2 MG/2ML IJ SOLN: 1.0000 mg | INTRAMUSCULAR | Status: DC | PRN

## 2019-07-08 MED ORDER — FENTANYL CITRATE (PF) 100 MCG/2ML IJ SOLN
INTRAMUSCULAR | Status: AC
  Filled 2019-07-08: qty 2

## 2019-07-08 MED ORDER — HEPARIN (PORCINE) 25000 UT/250ML-% IV SOLN
800.0000 [IU]/h | INTRAVENOUS | Status: DC
  Administered 2019-07-08 – 2019-07-09 (×3): 800 [IU]/h via INTRAVENOUS
  Filled 2019-07-08: qty 250

## 2019-07-08 MED ORDER — SODIUM CHLORIDE 0.9 % IV SOLN
0.5000 mg/h | INTRAVENOUS | Status: DC
  Administered 2019-07-08: 0.5 mg/h
  Filled 2019-07-08 (×3): qty 10

## 2019-07-08 MED ORDER — METOPROLOL TARTRATE 5 MG/5ML IV SOLN
5.0000 mg | Freq: Four times a day (QID) | INTRAVENOUS | Status: DC
  Administered 2019-07-08 – 2019-07-09 (×4): 5 mg via INTRAVENOUS
  Filled 2019-07-08 (×4): qty 5

## 2019-07-08 MED ORDER — MIDAZOLAM HCL 2 MG/2ML IJ SOLN
INTRAMUSCULAR | Status: DC | PRN
  Administered 2019-07-08: 1 mg via INTRAVENOUS

## 2019-07-08 MED ORDER — LIVING WELL WITH DIABETES BOOK
Freq: Once | Status: DC
  Filled 2019-07-08: qty 1

## 2019-07-08 MED ORDER — SODIUM CHLORIDE 0.9% FLUSH
3.0000 mL | Freq: Two times a day (BID) | INTRAVENOUS | Status: DC
  Administered 2019-07-08 – 2019-07-14 (×9): 3 mL via INTRAVENOUS

## 2019-07-08 MED ORDER — ONDANSETRON HCL 4 MG/2ML IJ SOLN
4.0000 mg | Freq: Four times a day (QID) | INTRAMUSCULAR | Status: DC | PRN
  Filled 2019-07-08 (×2): qty 2

## 2019-07-08 MED ORDER — CLONIDINE HCL 0.2 MG PO TABS: 0.2000 mg | ORAL_TABLET | ORAL | Status: DC | PRN

## 2019-07-08 MED ORDER — PROMETHAZINE HCL 25 MG/ML IJ SOLN
12.5000 mg | Freq: Once | INTRAMUSCULAR | Status: AC
  Administered 2019-07-08: 12.5 mg via INTRAVENOUS
  Filled 2019-07-08: qty 1

## 2019-07-08 SURGICAL SUPPLY — 9 items
BAG SNAP BAND KOVER 36X36 (MISCELLANEOUS) ×2 IMPLANT
CATH ANGIO 5F BER2 100CM (CATHETERS) ×2 IMPLANT
CATH INFUS 135CMX50CM (CATHETERS) ×4 IMPLANT
COVER DOME SNAP 22 D (MISCELLANEOUS) ×2 IMPLANT
GLIDEWIRE ADV .035X260CM (WIRE) ×2 IMPLANT
KIT MICROPUNCTURE NIT STIFF (SHEATH) ×2 IMPLANT
SHEATH PINNACLE 5F 10CM (SHEATH) ×4 IMPLANT
SHEATH PROBE COVER 6X72 (BAG) ×2 IMPLANT
TRAY PV CATH (CUSTOM PROCEDURE TRAY) ×2 IMPLANT

## 2019-07-08 NOTE — Progress Notes (Signed)
   I have discussed with patient proceeding she is feeling much better at this time and we will plan to place bilateral lytic catheters today in the PV lab.  Barbara Shiflett C. Donzetta Matters, MD [facility] Office: [phone] Pager: [phone]

## 2019-07-08 NOTE — Progress Notes (Signed)
[place] for Heparin Indication: DVT  Allergies  Allergen Reactions  . Penicillins Rash  . Morphine And Related Nausea And Vomiting    Patient Measurements: Height: 5\' 11"  (180.3 cm) Weight: 103.5 kg (228 lb 2.8 oz) IBW/kg (Calculated) : 70.8 Heparin Dosing Weight: 92.4 kg  Vital Signs: Temp: 98.4 F (36.9 C) (04/26 1212) Temp Source: Oral (04/26 1212) BP: 133/56 (04/26 1212) Pulse Rate: 84 (04/26 1212)  Labs: Recent Labs    07/07/19 0834 07/07/19 1624 07/08/19 0217 07/08/19 0539 07/08/19 1310  HGB 13.3  --  13.9  --   --   HCT 41.2  --  42.9  --   --   PLT 163  --  151  --   --   LABPROT 13.6  --  13.7  --   --   INR 1.1  --  1.1  --   --   HEPARINUNFRC  --    < > 1.58* 1.08* 0.78*  CREATININE 1.05*  --  1.06*  --   --    < > = values in this interval not displayed.    Estimated Creatinine Clearance: 68.2 mL/min (A) (by C-G formula based on SCr of 1.06 mg/dL (H)).   Medical History: Past Medical History:  Diagnosis Date  . Allergy   . Arthritis   . Blood transfusion without reported diagnosis    1987  . Cancer (HCC)    Basal Cell Carcinoma  . Cataract   . Chronic kidney disease    kidney stone once  . H/O hiatal hernia   . History of basal cell carcinoma excision    NOSE  . History of benign bladder tumor   . History of DVT of lower extremity    11/ 2011  BILATERAL  POST FOOT SURGERY  . History of pulmonary embolus (PE)    12/ 2011   POST FOOT SURGERY  . Hyperlipidemia   . Left ureteral calculus   . Migraines   . OSA on CPAP    STUDY DONE 2012  . PONV (postoperative nausea and vomiting)    severe  . Sigmoid diverticulosis   . Wears glasses     Medications:  Scheduled:  . docusate sodium  100 mg Oral BID  . insulin aspart  0-15 Units Subcutaneous TID WC  . insulin aspart  0-5 Units Subcutaneous QHS  . insulin glargine  10 Units Subcutaneous QHS  . living well with diabetes book   Does not apply  Once  . montelukast  10 mg Oral QHS  . oxymetazoline  1 spray Each Nare BID  . simvastatin  40 mg Oral QHS  . sodium chloride flush  3 mL Intravenous Q12H    Assessment: Patient is a 32 yof that presented to the ED with right foot pain. The patient has a hx of DVT/PE in the past and has been off Warfarin for ~ 5 years.  4/26 afternoon update:  Heparin level remains slightly supra-therapeutic   Goal of Therapy:  Heparin level 0.3-0.7 units/ml Monitor platelets by anticoagulation protocol: Yes   Plan:  Decrease heparin drip to 850 units/hr  -2100 heparin level  Murphy Bundick A. Levada Dy, PharmD, BCPS, FNKF Clinical Pharmacist [place] Please utilize [facility] for appropriate phone number to reach the unit pharmacist ([place])

## 2019-07-08 NOTE — Op Note (Signed)
    Patient name: Barbara Thomas MRN: [id number] DOB: 05-19-1950 Sex: female  07/08/2019 Pre-operative Diagnosis: Acute IVC filter occlusion with bilateral lower extremity DVT Post-operative diagnosis:  Same Surgeon:  Eda Paschal. Donzetta Matters, MD Procedure Performed: 1.  Ultrasound-guided cannulation right small saphenous vein 2.  Ultrasound-guided cannulation left small saphenous vein 3.  Central venogram 4.  Placement of bilateral lower extremity lytic catheter 50 cm treatment length from the filter to the mid femoral vein 5.  Moderate sedation with fentanyl and Versed for 29 minutes  Indications: 17 female with history of IVC filter placement.  This is now occluded by CT and duplex has bilateral lower extremity DVT is indicated for lysis with possible filter removal the following day  Findings: The left small saphenous vein was patent and compressible this was cannulated and wire was placed centrally.  The right small saphenous vein was occluded we are able to cannulate it and placed a wire centrally.  IVC above the filter was patent and to 50 cm treatment length catheters were placed from the femoral vein to the IVC filter   Procedure:  The patient was identified in the holding area and taken to room 8.  The patient was then placed prone on the procedural table sterilely prepped and draped her bilateral popliteal fossae and timeout was called.  We then cannulated the left small saphenous vein with micropuncture needle followed by wire sheath and placed a 5 French sheath.  We were able to cross centrally to the super filter IVC using very catheter Glidewire advantage confirmed intraluminal access with venogram.  50 cm treatment length catheter was then placed from the filter to the femoral vein and flushed.  Similarly on the left side we cannulated the small saphenous vein which was not compressible we were able to get in with micropuncture needle and wire and sheath.  5 French sheath was placed over  Glidewire advantage.  We used very catheter Glidewire advantage to traverse centrally confirmed intraluminal access above the filter.  Another 50 cm treatment length catheter was placed.  This was all done with patient heparinized.  We placed Dermabond at the size of the sheath and placed arm fusion wires in both catheters and flushed them.  These will be hooked to TPA overnight patient will remain heparinized.  She tolerated procedure without any complication.   Contrast:  15cc   Alitza Cowman C. Donzetta Matters, MD [facility] Office: [phone] Pager: [phone]

## 2019-07-08 NOTE — Progress Notes (Signed)
At 2234, 1 tablet of Norco was given to patient for RLE pain - rating as 9/10.  Also, gave patient one Ambien for sleep.  At approximately 2240, this nurse was called back to patient's room.  Patient is crying, moaning, writhing, and grabbing at left lower leg.  She is not complaining of the right leg.  She states that pain is severe and feels like worse cramping she has felt in her life.  She is verbalizing that she cannot take it anymore.  Able to doppler bilateral dorsalis pedis pulses and posterior tibial pulses.  The left foot is slightly cool to touch.  The right lower leg is red and more swollen than left lower leg.  There is no change from my earlier assessment.  Rapid Response RN called at 2244 to assess patient. Rapid Response is in agreement with my assessment.  Bodenheimer NP with [facility] called and made aware of situation.  1 mg IV Dilaudid given at 2321.  Patient also requested a muscle relaxer and zofran for nausea.  Bodenheimer NP made aware.  Order for IV Zofran 4 mg and PO Flexeril received and administered at 2320.  Patient continues to c/o severe pain to the LLE.  Bodenheimer called and made aware of no relief.  Order received for 1 mg IV Dilaudid which was given at 2353.   Pain slowing easing to 5/10.  She is not complaining of severe nausea.  Bodenheimer NP called and made aware.  Order received for 12.5 IV Phen received and given at 0042.  Patient finally sleep.  Checked VS at 2359.  Noted that O2 levels dropping as low as 82% on Room Air.  O2 levels came up to 95% and greater on 2 LPM.  Bodenheimer called and made aware.  Order for Oxygen via [place] received.  Will continue to monitor patient.  Earleen Reaper RN

## 2019-07-08 NOTE — Progress Notes (Signed)
[place] for Heparin Indication: DVT  Allergies  Allergen Reactions  . Penicillins Rash  . Morphine And Related Nausea And Vomiting    Patient Measurements: Height: 5\' 11"  (180.3 cm) Weight: 103.5 kg (228 lb 2.8 oz) IBW/kg (Calculated) : 70.8 Heparin Dosing Weight: 92.4 kg  Vital Signs: Temp: 98.4 F (36.9 C) (04/26 1212) Temp Source: Oral (04/26 1212) BP: 149/91 (04/26 1643) Pulse Rate: 79 (04/26 1643)  Labs: Recent Labs    07/07/19 0834 07/07/19 1624 07/08/19 0217 07/08/19 0539 07/08/19 1310  HGB 13.3  --  13.9  --   --   HCT 41.2  --  42.9  --   --   PLT 163  --  151  --   --   LABPROT 13.6  --  13.7  --   --   INR 1.1  --  1.1  --   --   HEPARINUNFRC  --    < > 1.58* 1.08* 0.78*  CREATININE 1.05*  --  1.06*  --   --    < > = values in this interval not displayed.    Estimated Creatinine Clearance: 68.2 mL/min (A) (by C-G formula based on SCr of 1.06 mg/dL (H)).  Medications:  . sodium chloride    . alteplase (LIMB ISCHEMIA) 10 mg in normal saline (0.02 mg/mL) infusion 0.5 mg/hr (07/08/19 1720)  . alteplase (LIMB ISCHEMIA) 10 mg in normal saline (0.02 mg/mL) infusion 0.5 mg/hr (07/08/19 1721)  . heparin 800 Units/hr (07/08/19 1657)    Assessment: 18 yof that presented to the ED with right foot pain. The patient has a hx of DVT/PE in the past and has been off warfarin for ~ 5 years. She is s/p US guided cannulation R/L small saphenous veins and bilateral LE lytic catheter placement infusing at 0.5 mg/hr. No bleeding noted.  Last heparin level was 0.78 on 950 units/hr. At 16:57 it was turned down to 800 units/hr for lower goal range.  Heparin level is 0.44 and therapeutic although only 3 hrs after being turned down to 800 units/hr. No bleeding noted.  Goal of Therapy:  Heparin level 0.2-0.5 units/ml Monitor platelets by anticoagulation protocol: Yes   Plan:  Continue heparin drip at 800 units/hr  Heparin level q6h  x4 ordered Daily CBC Monitor for s/sx of bleeding  Thank you for involving pharmacy in this patient's care.  Renold Genta, PharmD, BCPS Clinical Pharmacist Clinical phone for 07/08/2019 until 10p is [phone] 07/08/2019 5:31 PM  **Pharmacist phone directory can be found on [email or web address] listed under [place]**

## 2019-07-08 NOTE — Progress Notes (Addendum)
Progress Note    Barbara Thomas  [id number] DOB: 04-10-1950  DOA: 07/07/2019 PCP: Ann Held, DO    Brief Narrative:     Medical records reviewed and are as summarized below:  Barbara Thomas is an 69 y.o. female with a past medical history that includes DVT status post filter and on anticoagulation for several years, diabetes, obesity, obstructive sleep apnea on CPAP, hyperlipidemia admitted April 25 for VTE.  Likely related to immobility from broken toe, traveling in the car extended time.  Evaluated by vascular who opined extensive bilateral lower extremity DVT right greater than left with extension up to her inferior vena cava filter.  Plan for intervention possibly Tuesday  Assessment/Plan:   Principal Problem:   Venous thromboembolism confirmed by diagnostic testing Active Problems:   Acute respiratory failure with hypoxia (HCC)   OSA (obstructive sleep apnea)   Diabetes mellitus type 2 in obese (HCC)   Nausea   Obesity (BMI 30-39.9)   Hyperlipidemia LDL goal <100   #1.  Venous thromboembolism confirmed by diagnostic testing.  Work-up reveals extensive bilateral lower extremity DVT right greater than left with extension up to her inferior vena cava filter.  History of same after bilateral ankle fractures and associated immobility.  At that time also had a PE and IVC filter placed.  On anticoagulation for 4 years that was discontinued by hematology as it was felt DVT provoked. Evaluated by vascular surgery who recommend bilateral popliteal vein access and placement of bilateral Lytic catheters, TPA infusion for 24 hours, then mechanical thrombectomy.  Patient with pain in the left last night.  Received Dilaudid, Phenergan, Zofran, Norco and Flexeril.  Improved this morning but she is quite lethargic -Continue heparin drip -Back off of any sedating medications -Further management per vascular  #2.  Acute respiratory failure with hypoxia.  Last night patient  experiencing excruciating pain.  She had this pain on the left versus the right.  Provided with Flexeril Norco Dilaudid then complained of nausea received Zofran and then Phenergan.  Oxygen saturation level went to 88% on room air.  Currently oxygen saturation level 100% on 2 L nasal cannula -Minimize sedating medications -Incentive spirometry -Monitor oxygen saturation level -Oxygen supplementation as indicated -Mobilize as able -If no improvement consider chest x-ray  #3.  Nausea.  Likely related to opioids received for pain.  She was having some dry heaves.  Zofran provided no relief so she was provided with Phenergan.  She is quite lethargic this morning.  Denies any further nausea -Discontinue Phenergan -Zofran as needed -She is n.p.o. right now for anticipated procedure -Monitor  #4.  OSA.  Wears CPAP at home.  Did not have CPAP last night given her respiratory status -See #2 -CPAP  #5.  Diabetes type 2.  Uncontrolled.  According to patient she was never treated.  A1c 11.2. -Consider long-acting insulin -Continue sliding scale insulin for optimal control -Diabetes consult -She is n.p.o. right now in anticipation of procedure  #6.  Hyperlipidemia.  Home medications include Zocor -Continue Zocor  #7.  Obesity.  BMI 31.8. -Weight loss encouraged -Outpatient follow-up   Family Communication/Anticipated D/C date and plan/Code Status   DVT prophylaxis: heparin ordered. Code Status: Full Code.  Family Communication: patient and husband phone Disposition Plan: Status is: Inpatient  Remains inpatient appropriate because:IV treatments appropriate due to intensity of illness or inability to take PO   Dispo: The patient is from: Home  Anticipated d/c is to: Home              Anticipated d/c date is: 2 days              Patient currently is not medically stable to d/c.          Medical Consultants:   [facility]     Subjective:   Quite  lethargic but arouses to verbal stimuli.  Complains of sore throat and headache.  Reports pain in lower extremities much improved.  She dozes off to sleep quickly if not being stimulated  Objective:    Vitals:   07/08/19 0028 07/08/19 0145 07/08/19 0713 07/08/19 0859  BP:   (!) 156/85 (!) 153/67  Pulse:   90 81  Resp:  16 18 16   Temp:   97.8 F (36.6 C) (!) 97.5 F (36.4 C)  TempSrc:   Oral Oral  SpO2: 97% 97% 100% 100%  Weight:      Height:        Intake/Output Summary (Last 24 hours) at 07/08/2019 0941 Last data filed at 07/08/2019 0600 Gross per 24 hour  Intake 2177.73 ml  Output 750 ml  Net 1427.73 ml   Filed Weights   07/07/19 0150 07/07/19 1612  Weight: 101.6 kg 103.5 kg    Exam: General: Obese lethargic no acute distress CV: Regular rate and rhythm no murmur gallop or rub  erythema on the right lower extremity with some swelling greater than left. Respiratory: No increased work of breathing breath sounds are clear bilaterally good cough effort I hear no wheezes no rhonchi no crackles Abdomen: Obese soft positive bowel sounds throughout no guarding or rebounding Musculoskeletal: Joints without swelling/erythema Neuro: Quite lethargic but arouses to verbal stimuli oriented to self and place.  Speech clear facial symmetry  Data Reviewed:   I have personally reviewed following labs and imaging studies:  Labs: Labs show the following:   Basic Metabolic Panel: Recent Labs  Lab 07/07/19 0834 07/08/19 0217  NA 133* 135  K 4.1 3.9  CL 97* 97*  CO2 22 25  GLUCOSE 427* 251*  BUN 22 27*  CREATININE 1.05* 1.06*  CALCIUM 9.8 9.4   GFR Estimated Creatinine Clearance: 68.2 mL/min (A) (by C-G formula based on SCr of 1.06 mg/dL (H)). Liver Function Tests: Recent Labs  Lab 07/07/19 0834  AST 16  ALT 28  ALKPHOS 122  BILITOT 1.1  PROT 7.5  ALBUMIN 4.1   No results for input(s): LIPASE, AMYLASE in the last 168 hours. No results for input(s): AMMONIA in the  last 168 hours. Coagulation profile Recent Labs  Lab 07/07/19 0834 07/08/19 0217  INR 1.1 1.1    CBC: Recent Labs  Lab 07/07/19 0834 07/08/19 0217  WBC 10.2 10.7*  NEUTROABS 7.6  --   HGB 13.3 13.9  HCT 41.2 42.9  MCV 86.9 87.9  PLT 163 151   Cardiac Enzymes: No results for input(s): CKTOTAL, CKMB, CKMBINDEX, TROPONINI in the last 168 hours. BNP (last 3 results) No results for input(s): PROBNP in the last 8760 hours. CBG: Recent Labs  Lab 07/07/19 1756 07/07/19 2147 07/08/19 0644  GLUCAP 357* 311* 252*   D-Dimer: No results for input(s): DDIMER in the last 72 hours. Hgb A1c: Recent Labs    07/07/19 0834  HGBA1C 11.2*   Lipid Profile: No results for input(s): CHOL, HDL, LDLCALC, TRIG, CHOLHDL, LDLDIRECT in the last 72 hours. Thyroid function studies: No results for input(s): TSH, T4TOTAL, T3FREE, THYROIDAB  in the last 72 hours.  Invalid input(s): FREET3 Anemia work up: No results for input(s): VITAMINB12, FOLATE, FERRITIN, TIBC, IRON, RETICCTPCT in the last 72 hours. Sepsis Labs: Recent Labs  Lab 07/07/19 0834 07/08/19 0217  WBC 10.2 10.7*    Microbiology Recent Results (from the past 240 hour(s))  SARS CORONAVIRUS 2 (TAT 6-24 HRS) Nasopharyngeal Nasopharyngeal Swab     Status: None   Collection Time: 07/07/19  3:33 PM   Specimen: Nasopharyngeal Swab  Result Value Ref Range Status   SARS Coronavirus 2 NEGATIVE NEGATIVE Final    Comment: (NOTE) SARS-CoV-2 target nucleic acids are NOT DETECTED. The SARS-CoV-2 RNA is generally detectable in upper and lower respiratory specimens during the acute phase of infection. Negative results do not preclude SARS-CoV-2 infection, do not rule out co-infections with other pathogens, and should not be used as the sole basis for treatment or other patient management decisions. Negative results must be combined with clinical observations, patient history, and epidemiological information. The expected result is  Negative. Fact Sheet for Patients: SugarRoll.be Fact Sheet for Healthcare Providers: https://www.woods-mathews.com/ This test is not yet approved or cleared by the [place] FDA and  has been authorized for detection and/or diagnosis of SARS-CoV-2 by FDA under an Emergency Use Authorization (EUA). This EUA will remain  in effect (meaning this test can be used) for the duration of the COVID-19 declaration under Section 56 4(b)(1) of the Act, 21 U.S.C. section 360bbb-3(b)(1), unless the authorization is terminated or revoked sooner. Performed at [facility], [place] [address], [place], [place] [address]     Procedures and diagnostic studies:  VAS [place] IVC/ILIAC (VENOUS ONLY)  Result Date: 07/07/2019 IVC/ILIAC STUDY Indications: Acute DVT throughout right lower extremity Other Factors: Lupus, bladder cancer with resection and stenting. History of DVT                and PE. Vascular Interventions: History of IVC filter placed in [place] in 2012 post PE. Limitations: Air/bowel gas and body habitus.  Comparison Study: No prior study on file Performing Technologist: Sharion Dove RVS Supporting Technologist: Maudry Mayhew RDMS, RVT, RDCS  Examination Guidelines: A complete evaluation includes B-mode imaging, spectral Doppler, color Doppler, and power Doppler as needed of all accessible portions of each vessel. Bilateral testing is considered an integral part of a complete examination. Limited examinations for reoccurring indications may be performed as noted.  IVC/Iliac Findings: +--------+------+--------+--------+   IVC   PatentThrombusComments +--------+------+--------+--------+ IVC Prox       acute           +--------+------+--------+--------+  +-------------------+---------+-----------+---------+-----------+--------+         CIV        RT-PatentRT-ThrombusLT-PatentLT-ThrombusComments  +-------------------+---------+-----------+---------+-----------+--------+ Common Iliac Prox              acute                                +-------------------+---------+-----------+---------+-----------+--------+ Common Iliac Mid               acute                                +-------------------+---------+-----------+---------+-----------+--------+ Common Iliac Distal            acute               acute            +-------------------+---------+-----------+---------+-----------+--------+  +-------------------------+---------+-----------+---------+-----------+--------+  EIV           RT-PatentRT-ThrombusLT-PatentLT-ThrombusComments +-------------------------+---------+-----------+---------+-----------+--------+ External Iliac Vein Prox             acute               acute            +-------------------------+---------+-----------+---------+-----------+--------+ External Iliac Vein Mid              acute               acute            +-------------------------+---------+-----------+---------+-----------+--------+ External Iliac Vein                  acute               acute            Distal                                                                    +-------------------------+---------+-----------+---------+-----------+--------+   Summary: IVC/Iliac: There is evidence of acute thrombus involving the IVC. There is evidence of acute thrombus involving the right common iliac vein. There is evidence of acute thrombus involving the left common iliac vein. There is evidence of acute thrombus involving the right external iliac vein. There is evidence of acute thrombus involving the left external iliac vein.  *See table(s) above for measurements and observations.  Electronically signed by Harold Barban MD on 07/07/2019 at 11:39:53 AM.    Final    DG Foot Complete Right  Result Date: 07/07/2019 CLINICAL DATA:  Pain, fractured small toe  a week ago, increased pain and swelling EXAM: RIGHT FOOT COMPLETE - 3+ VIEW COMPARISON:  None. FINDINGS: Suspect a very subtle nondisplaced fracture of the base of the right fifth proximal phalanx. No other evident fracture. Joint spaces are preserved. Diffuse soft tissue edema about the forefoot. IMPRESSION: 1. Suspect a very subtle nondisplaced fracture of the base of the right fifth proximal phalanx, in keeping with reported fracture. 2.  Diffuse soft tissue edema about the forefoot. Electronically Signed   By: Eddie Candle M.D.   On: 07/07/2019 10:42   VAS [place] LOWER EXTREMITY VENOUS (DVT) (ONLY [facility] & [facility])  Result Date: 07/07/2019  Lower Venous DVTStudy Indications: Pain, Swelling, and History of DVT. Recent car trip to [place].  Risk Factors: Cancer bladder cancer with stenting and resection in 2015 DVT Bilateral femoral veins in 2012 lupus, IVC filter placed in 01/2011 post PE, in [place]. Comparison Study: Prior study from 02/23/13 is available for comparison Performing Technologist: Sharion Dove RVS  Examination Guidelines: A complete evaluation includes B-mode imaging, spectral Doppler, color Doppler, and power Doppler as needed of all accessible portions of each vessel. Bilateral testing is considered an integral part of a complete examination. Limited examinations for reoccurring indications may be performed as noted. The reflux portion of the exam is performed with the patient in reverse Trendelenburg.  +---------+---------------+---------+-----------+----------+--------------+ RIGHT    CompressibilityPhasicitySpontaneityPropertiesThrombus Aging +---------+---------------+---------+-----------+----------+--------------+ CFV      None           No       No  Acute          +---------+---------------+---------+-----------+----------+--------------+ SFJ      None                                         Acute           +---------+---------------+---------+-----------+----------+--------------+ FV Prox  None                                         Acute          +---------+---------------+---------+-----------+----------+--------------+ FV Mid   None                                         Acute          +---------+---------------+---------+-----------+----------+--------------+ FV DistalNone                                         Acute          +---------+---------------+---------+-----------+----------+--------------+ PFV      None                                         Acute          +---------+---------------+---------+-----------+----------+--------------+ POP      None           No       No                   Acute          +---------+---------------+---------+-----------+----------+--------------+ PTV      None                                         Acute          +---------+---------------+---------+-----------+----------+--------------+ PERO     None                                         Acute          +---------+---------------+---------+-----------+----------+--------------+ Gastroc  None                                         Acute          +---------+---------------+---------+-----------+----------+--------------+ GSV      Full                                                        +---------+---------------+---------+-----------+----------+--------------+   +----+---------------+---------+-----------+----------+--------------+ LEFTCompressibilityPhasicitySpontaneityPropertiesThrombus Aging +----+---------------+---------+-----------+----------+--------------+ CFV Full           No  No                   sluggish flow  +----+---------------+---------+-----------+----------+--------------+     Summary: RIGHT: - Findings consistent with acute deep vein thrombosis involving the right common femoral vein, SF junction, right femoral vein,  right proximal profunda vein, right popliteal vein, right posterior tibial veins, right peroneal veins, and right gastrocnemius veins.  LEFT: - No evidence of common femoral vein obstruction.  *See table(s) above for measurements and observations. Electronically signed by Harold Barban MD on 07/07/2019 at 11:40:10 AM.    Final    CT Angio Abd/Pel W and/or Wo Contrast  Result Date: 07/07/2019 CLINICAL DATA:  Leg vein DVT suspected EXAM: CTA ABDOMEN AND PELVIS WITHOUT AND WITH CONTRAST TECHNIQUE: Multidetector CT imaging of the abdomen and pelvis was performed using the standard protocol during bolus administration of intravenous contrast. Multiplanar reconstructed images and MIPs were obtained and reviewed to evaluate the vascular anatomy. CONTRAST:  15mL OMNIPAQUE IOHEXOL 350 MG/ML SOLN COMPARISON:  11/21/2013 FINDINGS: VASCULAR Scattered aortic atherosclerosis. Normal contour and caliber of the abdominal aorta. Standard branching pattern of the abdominal aorta with solitary bilateral renal arteries. There is a Recovery type IVC filter in the infrarenal IVC. The IVC at the level of the filter and inferiorly and bilateral common iliac veins, as well as included portions of the right internal and external iliac and superior portions of the right femoral veins are somewhat expansile appearing and non-opacified on 120 second delayed phase imaging. Review of the MIP images confirms the above findings. NON-VASCULAR Lower chest: No acute abnormality. Hepatobiliary: No solid liver abnormality is seen. Hepatic steatosis. No gallstones, gallbladder wall thickening, or biliary dilatation. Pancreas: Unremarkable. No pancreatic ductal dilatation or surrounding inflammatory changes. Spleen: Normal in size without significant abnormality. Adrenals/Urinary Tract: Adrenal glands are unremarkable. Kidneys are normal, without renal calculi, solid lesion, or hydronephrosis. Bladder is unremarkable. Stomach/Bowel: Stomach is within  normal limits. Appendix appears normal. No evidence of bowel wall thickening, distention, or inflammatory changes. Lymphatic:  No enlarged abdominal or pelvic lymph nodes. Reproductive: Status post hysterectomy. Other: No abdominal wall hernia or abnormality. No abdominopelvic ascites. Musculoskeletal: No acute or significant osseous findings. IMPRESSION: 1. There is a Recovery type IVC filter in the infrarenal IVC. The IVC at the level of the filter and inferiorly and bilateral common iliac veins, as well as included portions of the right internal and external iliac and superior portions of the right femoral veins are somewhat expansile appearing and non-opacified on 120 second delayed phase imaging. Findings are consistent with acute appearing central venous and deep venous thrombosis of the IVC, bilateral common iliac veins, and the included more distal right iliac and femoral veins. 2.  Hepatic steatosis. 3.  Aortic Atherosclerosis (ICD10-I70.0). Electronically Signed   By: Eddie Candle M.D.   On: 07/07/2019 11:49    Medications:    docusate sodium  100 mg Oral BID   insulin aspart  0-15 Units Subcutaneous TID WC   insulin aspart  0-5 Units Subcutaneous QHS   montelukast  10 mg Oral QHS   simvastatin  40 mg Oral QHS   sodium chloride flush  3 mL Intravenous Q12H   Continuous Infusions:  heparin 950 Units/hr (07/08/19 0503)   lactated ringers 100 mL/hr at 07/08/19 0505     LOS: 1 day   Radene Gunning NP  [facility]   How to contact the [facility] Attending or Consulting provider [place] or covering provider during  after hours 7P -7A, for this patient?  Check the care team in [facility] and look for a) attending/consulting [facility] provider listed and b) the [facility] team listed Log into [email or web address] and use [place]'s universal password to access. If you do not have the password, please contact the hospital operator. Locate the [facility] provider you are looking for under [facility] and page to a  number that you can be directly reached. If you still have difficulty reaching the provider, please page the [facility] (Director on Call) for the Hospitalists listed on amion for assistance.  07/08/2019, 9:41 AM

## 2019-07-08 NOTE — Progress Notes (Signed)
    Subjective  -   Had severe left leg pain (cramping) last night.  This appears to have improved. This morning she is complaining of a headache and sore throat with dry heaves   Physical Exam:  Both legs remain edematous Pedal pulses are dopplerable No evidence of phlegmasia   Assessment/Plan:    If patient sore throat dry heaves and headache improves we could potentially proceed with lytic catheter placement.  If she continues to feel poorly, I would like to wait until she has recovered.  Wells Treyten Monestime 07/08/2019 8:25 AM --  Vitals:   07/08/19 0145 07/08/19 0713  BP:  (!) 156/85  Pulse:  90  Resp: 16 18  Temp:  97.8 F (36.6 C)  SpO2: 97% 100%    Intake/Output Summary (Last 24 hours) at 07/08/2019 0825 Last data filed at 07/08/2019 0600 Gross per 24 hour  Intake 2177.73 ml  Output 750 ml  Net 1427.73 ml     Laboratory CBC    Component Value Date/Time   WBC 10.7 (H) 07/08/2019 0217   HGB 13.9 07/08/2019 0217   HGB 14.1 03/13/2013 1028   HCT 42.9 07/08/2019 0217   HCT 43.5 03/13/2013 1028   PLT 151 07/08/2019 0217   PLT 225 03/13/2013 1028    BMET    Component Value Date/Time   NA 135 07/08/2019 0217   K 3.9 07/08/2019 0217   CL 97 (L) 07/08/2019 0217   CO2 25 07/08/2019 0217   GLUCOSE 251 (H) 07/08/2019 0217   BUN 27 (H) 07/08/2019 0217   CREATININE 1.06 (H) 07/08/2019 0217   CALCIUM 9.4 07/08/2019 0217   GFRNONAA 54 (L) 07/08/2019 0217   GFRAA >60 07/08/2019 0217    COAG Lab Results  Component Value Date   INR 1.1 07/08/2019   INR 1.1 07/07/2019   INR 0.95 11/21/2013   No results found for: PTT  Antibiotics Anti-infectives (From admission, onward)   Start     Dose/Rate Route Frequency Ordered Stop   07/07/19 0815  sulfamethoxazole-trimethoprim (BACTRIM DS) 800-160 MG per tablet 1 tablet  Status:  Discontinued     1 tablet Oral  Once 07/07/19 0804 07/07/19 0804       V. Leia Alf, M.D., [facility]  [facility] [place] Office: [phone] Pager:  [phone]

## 2019-07-08 NOTE — Progress Notes (Signed)
Inpatient Diabetes Program Recommendations  AACE/ADA: New Consensus Statement on Inpatient Glycemic Control   Target Ranges:  Prepandial:   less than 140 mg/dL      Peak postprandial:   less than 180 mg/dL (1-2 hours)      Critically ill patients:  140 - 180 mg/dL   Results for Barbara Thomas, Barbara Thomas" (MRN [id number]) as of 07/08/2019 11:31  Ref. Range 07/07/2019 17:56 07/07/2019 21:47 07/08/2019 06:44  Glucose-Capillary Latest Ref Range: 70 - 99 mg/dL 357 (H) 311 (H) 252 (H)  Results for Barbara Thomas, Barbara Thomas" (MRN [id number]) as of 07/08/2019 11:31  Ref. Range 07/07/2019 08:34  Glucose Latest Ref Range: 70 - 99 mg/dL 427 (H)  Hemoglobin A1C Latest Ref Range: 4.8 - 5.6 % 11.2 (H)  Results for Barbara Thomas, Barbara Thomas" (MRN [id number]) as of 07/08/2019 11:31  Ref. Range 02/16/2016 14:58 11/27/2017 12:01 06/21/2018 09:14 07/07/2019 08:34  Hemoglobin A1C Latest Ref Range: 4.8 - 5.6 % 6.4 6.9 (H) 7.2 (H) 11.2 (H)   Review of Glycemic Control  Diabetes history: No Outpatient Diabetes medications: NA Current orders for Inpatient glycemic control: Novolog 0-15 units TID with meals, Novolog 0-5 units QHS  Inpatient Diabetes Program Recommendations:   Insulin - Basal: Please consider ordering Lantus 10 units Q24H to start now.  HgbA1C: A1C 11.2% on 07/07/19 indicating an average glucose of 275 mg/dl over the past 2-3 months.  NOTE: Patient admitted with venous thromboembolism.  Noted consult for Diabetes Coordinator for new DM dx. Per chart, patient has never been told she had DM. A1C 6.9% on 11/27/17 and 7.2% on 06/21/18. Initial lab glucose on 07/07/19 was 427 mg/dl and current A1C 11.2% on 07/06/19. Ordered Living Well with DM book.   Spoke with patient over the phone about new diabetes diagnosis. Patient notes that she does feel great and has a sore throat along with multiple blood clots.  Patient states that she was not aware of A1C results from last April and was not aware that she had DM. Patient states that her  brother has DM and is able to keep his managed with diet and Metformin. Patient states that she has been trying to watch her carbohydrate intake. Discussed A1C results (11.2% on 07/07/19) and explained what an A1C is and informed patient that current A1C indicates an average glucose of 275 mg/dl over the past 2-3 months. Reviewed that prior A1C was 7.2% on 06/21/18 which indicated an average glucose of 160 mg/dl.   Discussed basic pathophysiology of DM Type 2, basic home care, importance of checking CBGs and maintaining good CBG control to prevent long-term and short-term complications. Discussed Carb Modified diet and patient notes that she is already drinking unseweetened tea with splenda, water, and has a regular soda once in a while. She has been trying to limit bread, potatoes, and other starchy vegetables. Will consult RD for further diet education.   Informed patient that a Living Well with diabetes booklet has been ordered and patient states that she has already received the book. Encouraged patient to read through the book today if she feels up to it. Discussed that patient will need DM medications prescribed at discharge and explained that with current Novolog correction scale her glucose has remained elevated in 240-250's mg/dl today. Informed patient that MD has not indicted if she will be prescribed oral DM medications or insulin for outpatient DM control. Patient states that she would be willing to take insulin if needed and she noted she wanted to take whatever was  safest for her to take. Patient has several issues and health concerns at this time so informed patient that diabetes coordinator will follow back up with her tomorrow to continue discussing DM and possibly insulin if needed.   Patient verbalized understanding of information discussed and she states that she has no further questions at this time related to diabetes.   RNs to provide ongoing basic DM education at bedside with this patient.     Thanks, Barnie Alderman, RN, MSN, CDE Diabetes Coordinator Inpatient Diabetes Program [phone] (Team Pager from 8am to 5pm)

## 2019-07-08 NOTE — Care Management (Signed)
Per Reather Converse W/SliverScript, Co-pay amount for Xarelto 43m daily and or Eliquis 510mbid.for 30 day supply $365.00. After deductible has been met  Co-pays will be $47.00 retail. Mail order 90 day supply will be $105.00.  No PA required Deductible not met Tier 3 Retail pharmacy: [facility]

## 2019-07-08 NOTE — Progress Notes (Signed)
[place] for Heparin Indication: DVT  Allergies  Allergen Reactions  . Penicillins Rash  . Morphine And Related Nausea And Vomiting    Patient Measurements: Height: 5\' 11"  (180.3 cm) Weight: 103.5 kg (228 lb 2.8 oz) IBW/kg (Calculated) : 70.8 Heparin Dosing Weight: 92.4 kg  Vital Signs: Temp: 98.2 F (36.8 C) (04/25 2359) Temp Source: Oral (04/25 2359) BP: 130/65 (04/25 2359) Pulse Rate: 90 (04/26 0014)  Labs: Recent Labs    07/07/19 0834 07/07/19 1624 07/08/19 0217  HGB 13.3  --  13.9  HCT 41.2  --  42.9  PLT 163  --  151  LABPROT 13.6  --  13.7  INR 1.1  --  1.1  HEPARINUNFRC  --  1.68* 1.58*  CREATININE 1.05*  --  1.06*    Estimated Creatinine Clearance: 68.2 mL/min (A) (by C-G formula based on SCr of 1.06 mg/dL (H)).   Medical History: Past Medical History:  Diagnosis Date  . Allergy   . Arthritis   . Blood transfusion without reported diagnosis    1987  . Cancer (HCC)    Basal Cell Carcinoma  . Cataract   . Chronic kidney disease    kidney stone once  . H/O hiatal hernia   . History of basal cell carcinoma excision    NOSE  . History of benign bladder tumor   . History of DVT of lower extremity    11/ 2011  BILATERAL  POST FOOT SURGERY  . History of pulmonary embolus (PE)    12/ 2011   POST FOOT SURGERY  . Hyperlipidemia   . Left ureteral calculus   . Migraines   . OSA on CPAP    STUDY DONE 2012  . PONV (postoperative nausea and vomiting)    severe  . Sigmoid diverticulosis   . Wears glasses     Medications:  Scheduled:  . docusate sodium  100 mg Oral BID  . insulin aspart  0-15 Units Subcutaneous TID WC  . insulin aspart  0-5 Units Subcutaneous QHS  . montelukast  10 mg Oral QHS  . simvastatin  40 mg Oral QHS  . sodium chloride flush  3 mL Intravenous Q12H    Assessment: Patient is a 31 yof that presented to the ED with right foot pain. The patient has a hx of DVT/PE in the past and has  been off Warfarin for ~ 5 years.  4/26 AM update:  Heparin level remains supra-therapeutic   Goal of Therapy:  Heparin level 0.3-0.7 units/ml Monitor platelets by anticoagulation protocol: Yes   Plan:  -Hold heparin x 1 hr -Re-start heparin drip at 950 units/hr at 0500 -1300 heparin level  Narda Bonds, PharmD, [place] Pharmacist Phone: [phone]

## 2019-07-09 ENCOUNTER — Inpatient Hospital Stay (HOSPITAL_COMMUNITY): Payer: Medicare Other

## 2019-07-09 ENCOUNTER — Encounter (HOSPITAL_COMMUNITY)
Admission: EM | Disposition: A | Discharge status: Home Health | Payer: Self-pay | Source: Home / Self Care | Attending: Internal Medicine

## 2019-07-09 ENCOUNTER — Inpatient Hospital Stay (HOSPITAL_COMMUNITY): Payer: Medicare Other | Admitting: Certified Registered"

## 2019-07-09 HISTORY — PX: THROMBECTOMY ILIAC ARTERY: SHX6405

## 2019-07-09 LAB — CBC
HCT: 36.8 % (ref 36.0–46.0)
HCT: 36.2 % (ref 36.0–46.0)
HCT: 25.2 % — ABNORMAL LOW (ref 36.0–46.0)
Hemoglobin: 12 g/dL (ref 12.0–15.0)
Hemoglobin: 11.4 g/dL — ABNORMAL LOW (ref 12.0–15.0)
Hemoglobin: 8.5 g/dL — ABNORMAL LOW (ref 12.0–15.0)
MCH: 28.4 pg (ref 26.0–34.0)
MCH: 28 pg (ref 26.0–34.0)
MCH: 30 pg (ref 26.0–34.0)
MCHC: 32.6 g/dL (ref 30.0–36.0)
MCHC: 31.5 g/dL (ref 30.0–36.0)
MCHC: 33.7 g/dL (ref 30.0–36.0)
MCV: 87.2 fL (ref 80.0–100.0)
MCV: 88.9 fL (ref 80.0–100.0)
MCV: 89 fL (ref 80.0–100.0)
Platelets: 112 10*3/uL — ABNORMAL LOW (ref 150–400)
Platelets: 117 10*3/uL — ABNORMAL LOW (ref 150–400)
Platelets: 119 10*3/uL — ABNORMAL LOW (ref 150–400)
RBC: 4.22 MIL/uL (ref 3.87–5.11)
RBC: 4.07 MIL/uL (ref 3.87–5.11)
RBC: 2.83 MIL/uL — ABNORMAL LOW (ref 3.87–5.11)
RDW: 13.4 % (ref 11.5–15.5)
RDW: 13.4 % (ref 11.5–15.5)
RDW: 13.2 % (ref 11.5–15.5)
WBC: 8.5 10*3/uL (ref 4.0–10.5)
WBC: 6.9 10*3/uL (ref 4.0–10.5)
WBC: 11.8 10*3/uL — ABNORMAL HIGH (ref 4.0–10.5)
nRBC: 0 % (ref 0.0–0.2)
nRBC: 0 % (ref 0.0–0.2)
nRBC: 0 % (ref 0.0–0.2)

## 2019-07-09 LAB — PREPARE RBC (CROSSMATCH)

## 2019-07-09 LAB — BASIC METABOLIC PANEL
Anion gap: 11 (ref 5–15)
Anion gap: 8 (ref 5–15)
BUN: 13 mg/dL (ref 8–23)
BUN: 13 mg/dL (ref 8–23)
CO2: 21 mmol/L — ABNORMAL LOW (ref 22–32)
CO2: 24 mmol/L (ref 22–32)
Calcium: 8.5 mg/dL — ABNORMAL LOW (ref 8.9–10.3)
Calcium: 6.9 mg/dL — ABNORMAL LOW (ref 8.9–10.3)
Chloride: 104 mmol/L (ref 98–111)
Chloride: 106 mmol/L (ref 98–111)
Creatinine, Ser: 0.75 mg/dL (ref 0.44–1.00)
Creatinine, Ser: 0.79 mg/dL (ref 0.44–1.00)
GFR calc Af Amer: >60 mL/min (ref >60)
GFR calc Af Amer: >60 mL/min (ref >60)
GFR calc non Af Amer: >60 mL/min (ref >60)
GFR calc non Af Amer: >60 mL/min (ref >60)
Glucose, Bld: 231 mg/dL — ABNORMAL HIGH (ref 70–99)
Glucose, Bld: 302 mg/dL — ABNORMAL HIGH (ref 70–99)
Potassium: 3.6 mmol/L (ref 3.5–5.1)
Potassium: 4.2 mmol/L (ref 3.5–5.1)
Sodium: 136 mmol/L (ref 135–145)
Sodium: 138 mmol/L (ref 135–145)

## 2019-07-09 LAB — POCT I-STAT 7, (LYTES, BLD GAS, ICA,H+H)
Acid-Base Excess: 0 mmol/L (ref 0.0–2.0)
Bicarbonate: 24.9 mmol/L (ref 20.0–28.0)
Calcium, Ion: 1.11 mmol/L — ABNORMAL LOW (ref 1.15–1.40)
HCT: 21 % — ABNORMAL LOW (ref 36.0–46.0)
Hemoglobin: 7.1 g/dL — ABNORMAL LOW (ref 12.0–15.0)
O2 Saturation: 96 %
Patient temperature: 92
Potassium: 3.7 mmol/L (ref 3.5–5.1)
Sodium: 141 mmol/L (ref 135–145)
TCO2: 26 mmol/L (ref 22–32)
pCO2 arterial: 36.1 mmHg (ref 32.0–48.0)
pH, Arterial: 7.43 (ref 7.350–7.450)
pO2, Arterial: 67 mmHg — ABNORMAL LOW (ref 83.0–108.0)

## 2019-07-09 LAB — ABO/RH: ABO/RH(D): B POS

## 2019-07-09 LAB — GLUCOSE, CAPILLARY
Glucose-Capillary: 194 mg/dL — ABNORMAL HIGH (ref 70–99)
Glucose-Capillary: 219 mg/dL — ABNORMAL HIGH (ref 70–99)
Glucose-Capillary: 238 mg/dL — ABNORMAL HIGH (ref 70–99)
Glucose-Capillary: 257 mg/dL — ABNORMAL HIGH (ref 70–99)
Glucose-Capillary: 283 mg/dL — ABNORMAL HIGH (ref 70–99)
Glucose-Capillary: 305 mg/dL — ABNORMAL HIGH (ref 70–99)

## 2019-07-09 LAB — HEPARIN LEVEL (UNFRACTIONATED)
Heparin Unfractionated: 0.3 IU/mL (ref 0.30–0.70)
Heparin Unfractionated: 0.23 IU/mL — ABNORMAL LOW (ref 0.30–0.70)
Heparin Unfractionated: 0.49 IU/mL (ref 0.30–0.70)

## 2019-07-09 LAB — FIBRINOGEN
Fibrinogen: 197 mg/dL — ABNORMAL LOW (ref 210–475)
Fibrinogen: 207 mg/dL — ABNORMAL LOW (ref 210–475)

## 2019-07-09 SURGERY — THROMBECTOMY, ARTERY, ILIAC
Anesthesia: General | Site: Neck | Laterality: Bilateral

## 2019-07-09 SURGERY — PERIPHERAL VASCULAR THROMBECTOMY
Anesthesia: LOCAL

## 2019-07-09 MED ORDER — LIDOCAINE HCL (CARDIAC) PF 100 MG/5ML IV SOSY
PREFILLED_SYRINGE | INTRAVENOUS | Status: DC | PRN
  Administered 2019-07-09: 60 mg via INTRATRACHEAL

## 2019-07-09 MED ORDER — FENTANYL CITRATE (PF) 250 MCG/5ML IJ SOLN
INTRAMUSCULAR | Status: DC | PRN
  Administered 2019-07-09: 100 ug via INTRAVENOUS
  Administered 2019-07-09 (×2): 50 ug via INTRAVENOUS

## 2019-07-09 MED ORDER — FENTANYL CITRATE (PF) 250 MCG/5ML IJ SOLN
INTRAMUSCULAR | Status: AC
  Filled 2019-07-09: qty 5

## 2019-07-09 MED ORDER — HEPARIN SODIUM (PORCINE) 1000 UNIT/ML IJ SOLN
INTRAMUSCULAR | Status: DC | PRN
Start: 2019-07-09 — End: 2019-07-09
  Administered 2019-07-09: 15000 [IU] via INTRAVENOUS

## 2019-07-09 MED ORDER — ALBUMIN HUMAN 5 % IV SOLN: INTRAVENOUS | Status: DC | PRN

## 2019-07-09 MED ORDER — IOHEXOL 300 MG/ML  SOLN
100.0000 mL | Freq: Once | INTRAMUSCULAR | Status: AC | PRN
  Administered 2019-07-09: 100 mL via INTRAVENOUS

## 2019-07-09 MED ORDER — PHENYLEPHRINE HCL-NACL 10-0.9 MG/250ML-% IV SOLN
0.0000 ug/min | INTRAVENOUS | Status: DC
  Administered 2019-07-09: 25 ug/min via INTRAVENOUS
  Administered 2019-07-09: 130 ug/min via INTRAVENOUS
  Administered 2019-07-09: 400 ug/min via INTRAVENOUS
  Administered 2019-07-10: 100 ug/min via INTRAVENOUS
  Administered 2019-07-10: 120 ug/min via INTRAVENOUS
  Administered 2019-07-10: 100 ug/min via INTRAVENOUS
  Filled 2019-07-09: qty 500
  Filled 2019-07-09: qty 1000
  Filled 2019-07-09 (×3): qty 250
  Filled 2019-07-09 (×2): qty 500
  Filled 2019-07-09: qty 250

## 2019-07-09 MED ORDER — OXYCODONE HCL 5 MG/5ML PO SOLN: 5.0000 mg | Freq: Once | ORAL | Status: DC | PRN

## 2019-07-09 MED ORDER — ROCURONIUM BROMIDE 100 MG/10ML IV SOLN
INTRAVENOUS | Status: DC | PRN
  Administered 2019-07-09: 20 mg via INTRAVENOUS
  Administered 2019-07-09: 10 mg via INTRAVENOUS
  Administered 2019-07-09: 70 mg via INTRAVENOUS

## 2019-07-09 MED ORDER — HEPARIN SODIUM (PORCINE) 1000 UNIT/ML IJ SOLN
INTRAMUSCULAR | Status: AC
  Filled 2019-07-09: qty 1

## 2019-07-09 MED ORDER — PROPOFOL 10 MG/ML IV BOLUS
INTRAVENOUS | Status: DC | PRN
Start: 2019-07-09 — End: 2019-07-09
  Administered 2019-07-09: 120 mg via INTRAVENOUS

## 2019-07-09 MED ORDER — 0.9 % SODIUM CHLORIDE (POUR BTL) OPTIME
TOPICAL | Status: DC | PRN
  Administered 2019-07-09: 1000 mL

## 2019-07-09 MED ORDER — SODIUM CHLORIDE 0.9 % IV SOLN
INTRAVENOUS | Status: AC
  Filled 2019-07-09: qty 1.2

## 2019-07-09 MED ORDER — CEFAZOLIN SODIUM-DEXTROSE 2-4 GM/100ML-% IV SOLN
INTRAVENOUS | Status: AC
  Filled 2019-07-09: qty 100

## 2019-07-09 MED ORDER — ACETAMINOPHEN 500 MG PO TABS: 1000.0000 mg | ORAL_TABLET | Freq: Once | ORAL | Status: DC | PRN

## 2019-07-09 MED ORDER — SCOPOLAMINE 1 MG/3DAYS TD PT72
1.0000 | MEDICATED_PATCH | TRANSDERMAL | Status: DC
  Administered 2019-07-09 – 2019-07-15 (×3): 1.5 mg via TRANSDERMAL
  Filled 2019-07-09 (×3): qty 1

## 2019-07-09 MED ORDER — SODIUM CHLORIDE (PF) 0.9 % IJ SOLN
INTRAVENOUS | Status: DC | PRN
  Administered 2019-07-09: 150 mL via INTRAMUSCULAR

## 2019-07-09 MED ORDER — CEFAZOLIN SODIUM-DEXTROSE 2-4 GM/100ML-% IV SOLN
2.0000 g | Freq: Once | INTRAVENOUS | Status: AC
  Administered 2019-07-09: 2 g via INTRAVENOUS

## 2019-07-09 MED ORDER — SUGAMMADEX SODIUM 200 MG/2ML IV SOLN
INTRAVENOUS | Status: DC | PRN
  Administered 2019-07-09: 200 mg via INTRAVENOUS

## 2019-07-09 MED ORDER — PHENYLEPHRINE HCL (PRESSORS) 10 MG/ML IV SOLN
INTRAVENOUS | Status: DC | PRN
  Administered 2019-07-09 (×3): 120 ug via INTRAVENOUS
  Administered 2019-07-09: 80 ug via INTRAVENOUS

## 2019-07-09 MED ORDER — PHENYLEPHRINE HCL-NACL 10-0.9 MG/250ML-% IV SOLN
INTRAVENOUS | Status: DC | PRN
  Administered 2019-07-09: 50 ug/min via INTRAVENOUS

## 2019-07-09 MED ORDER — SODIUM CHLORIDE 0.9% IV SOLUTION: Freq: Once | INTRAVENOUS | Status: AC

## 2019-07-09 MED ORDER — FENTANYL CITRATE (PF) 100 MCG/2ML IJ SOLN: 25.0000 ug | INTRAMUSCULAR | Status: DC | PRN

## 2019-07-09 MED ORDER — ACETAMINOPHEN 160 MG/5ML PO SOLN: 1000.0000 mg | Freq: Once | ORAL | Status: DC | PRN

## 2019-07-09 MED ORDER — PHENYLEPHRINE HCL-NACL 10-0.9 MG/250ML-% IV SOLN: 0.0000 ug/min | INTRAVENOUS | Status: DC

## 2019-07-09 MED ORDER — OXYCODONE HCL 5 MG PO TABS: 5.0000 mg | ORAL_TABLET | Freq: Once | ORAL | Status: DC | PRN

## 2019-07-09 MED ORDER — LACTATED RINGERS IV SOLN: INTRAVENOUS | Status: DC | PRN

## 2019-07-09 MED ORDER — MORPHINE SULFATE (PF) 4 MG/ML IV SOLN
3.0000 mg | INTRAVENOUS | Status: DC | PRN
  Administered 2019-07-09 – 2019-07-10 (×2): 4 mg via INTRAVENOUS
  Filled 2019-07-09 (×2): qty 1

## 2019-07-09 MED ORDER — SODIUM CHLORIDE 0.9 % IV SOLN
INTRAVENOUS | Status: DC | PRN
  Administered 2019-07-09: 500 mL

## 2019-07-09 MED ORDER — ACETAMINOPHEN 10 MG/ML IV SOLN: 1000.0000 mg | Freq: Once | INTRAVENOUS | Status: DC | PRN

## 2019-07-09 MED ORDER — MIDAZOLAM HCL 2 MG/2ML IJ SOLN
INTRAMUSCULAR | Status: AC
  Filled 2019-07-09: qty 2

## 2019-07-09 SURGICAL SUPPLY — 60 items
ANGIOVAC CIRCUIT (MISCELLANEOUS) ×3
BAG ISOLATION DRAPE 18X18 (DRAPES) ×2 IMPLANT
BALLN MUSTANG 12X60X75 (BALLOONS) ×3
BALLOON MUSTANG 12X60X75 (BALLOONS) ×1 IMPLANT
CANISTER SUCT 3000ML PPV (MISCELLANEOUS) IMPLANT
CANNULA C20 ANGIOVAC 20 DEG (CANNULA) ×3 IMPLANT
CANNULA OPTISITE PERFUSION 18F (CANNULA) ×3 IMPLANT
CATH ANGIO 5F BER2 65CM (CATHETERS) ×3 IMPLANT
CATH BEACON 5 .035 65 KMP TIP (CATHETERS) IMPLANT
CATH ROBINSON RED A/P 18FR (CATHETERS) ×3 IMPLANT
CATH THROMBEC 7F 135 CLEANER15 (MISCELLANEOUS) ×3 IMPLANT
CATH VISIONS PV .035 IVUS (CATHETERS) ×3 IMPLANT
CHLORAPREP W/TINT 26 (MISCELLANEOUS) IMPLANT
CIRCUIT ANGIOVAC (MISCELLANEOUS) ×1 IMPLANT
COVER MAYO STAND STRL (DRAPES) ×3 IMPLANT
COVER WAND RF STERILE (DRAPES) IMPLANT
DRAPE 3/4 80X56 (DRAPES) ×3 IMPLANT
DRAPE HALF SHEET 40X57 (DRAPES) ×3 IMPLANT
DRAPE ISOLATION BAG 18X18 (DRAPES) ×4
DRAPE ORTHO SPLIT ENLRG (DRAPES) ×3 IMPLANT
DRSG TEGADERM 4X4.75 (GAUZE/BANDAGES/DRESSINGS) ×9 IMPLANT
DRYSEAL FLEXSHEATH 26FR 65CM (SHEATH) ×2
GAUZE SPONGE 2X2 8PLY STRL LF (GAUZE/BANDAGES/DRESSINGS) ×5 IMPLANT
GLIDEWIRE ADV .035X180CM (WIRE) IMPLANT
GLIDEWIRE ADV .035X260CM (WIRE) ×6 IMPLANT
GLOVE BIO SURGEON STRL SZ 6.5 (GLOVE) ×4 IMPLANT
GLOVE BIO SURGEON STRL SZ7 (GLOVE) ×3 IMPLANT
GLOVE BIO SURGEONS STRL SZ 6.5 (GLOVE) ×2
GLOVE BIOGEL PI IND STRL 7.5 (GLOVE) ×1 IMPLANT
GLOVE BIOGEL PI INDICATOR 7.5 (GLOVE) ×2
GLOVE INDICATOR 7.5 STRL GRN (GLOVE) ×3 IMPLANT
GOWN STRL REUS W/ TWL LRG LVL3 (GOWN DISPOSABLE) ×2 IMPLANT
GOWN STRL REUS W/ TWL XL LVL3 (GOWN DISPOSABLE) ×2 IMPLANT
GOWN STRL REUS W/TWL LRG LVL3 (GOWN DISPOSABLE) ×4
GOWN STRL REUS W/TWL XL LVL3 (GOWN DISPOSABLE) ×4
KIT BASIN OR (CUSTOM PROCEDURE TRAY) ×3 IMPLANT
KIT DILATOR VASC 18G NDL (KITS) ×3 IMPLANT
KIT ENCORE 26 ADVANTAGE (KITS) ×3 IMPLANT
NEEDLE HYPO 25GX1X1/2 BEV (NEEDLE) IMPLANT
NEEDLE PERC 18GX7CM (NEEDLE) ×3 IMPLANT
NS IRRIG 1000ML POUR BTL (IV SOLUTION) ×3 IMPLANT
PACK ENDO MINOR (CUSTOM PROCEDURE TRAY) ×3 IMPLANT
PAD ARMBOARD 7.5X6 YLW CONV (MISCELLANEOUS) ×6 IMPLANT
PUMP SARN DELFIN (MISCELLANEOUS) ×3 IMPLANT
SET MICROPUNCTURE 5F STIFF (MISCELLANEOUS) ×3 IMPLANT
SHEATH BRITE TIP 8FR 23CM (SHEATH) ×6 IMPLANT
SHEATH DRYSEAL FLEX 26FR 65CM (SHEATH) ×1 IMPLANT
SHEATH PINNACLE 5F 10CM (SHEATH) ×3 IMPLANT
SHEATH PINNACLE 8F 10CM (SHEATH) IMPLANT
SHEATH PINNACLE R/O II 6F 4CM (SHEATH) ×3 IMPLANT
SNARE GOOSENECK 10MM (VASCULAR PRODUCTS) ×3 IMPLANT
SPONGE GAUZE 2X2 STER 10/PKG (GAUZE/BANDAGES/DRESSINGS) ×10
SUT ETHILON 2 0 PSLX (SUTURE) ×12 IMPLANT
SUT SILK 2 0 PERMA HAND 18 BK (SUTURE) ×6 IMPLANT
SYR CONTROL 10ML LL (SYRINGE) ×6 IMPLANT
TOWEL GREEN STERILE (TOWEL DISPOSABLE) ×3 IMPLANT
TOWEL GREEN STERILE FF (TOWEL DISPOSABLE) IMPLANT
WATER STERILE IRR 1000ML POUR (IV SOLUTION) IMPLANT
WIRE AMPLATZ SS-J .035X180CM (WIRE) ×6 IMPLANT
WIRE BENTSON .035X145CM (WIRE) ×6 IMPLANT

## 2019-07-09 NOTE — Anesthesia Preprocedure Evaluation (Signed)
Anesthesia Evaluation  Patient identified by MRN, date of birth, ID band Patient awake    Reviewed: Allergy & Precautions, NPO status , Patient's Chart, lab work & pertinent test results  History of Anesthesia Complications (+) PONV and history of anesthetic complications  Airway Mallampati: III  TM Distance: <3 FB Neck ROM: Full    Dental  (+) Dental Advisory Given, Teeth Intact   Pulmonary neg shortness of breath, sleep apnea and Continuous Positive Airway Pressure Ventilation , neg recent URI,    breath sounds clear to auscultation       Cardiovascular negative cardio ROS   Rhythm:Regular     Neuro/Psych  Headaches, negative psych ROS   GI/Hepatic hiatal hernia, GERD  ,  Endo/Other  diabetes  Renal/GU Renal disease     Musculoskeletal  (+) Arthritis ,   Abdominal   Peds  Hematology  (+) Blood dyscrasia, anemia ,   Anesthesia Other Findings   Reproductive/Obstetrics                             Anesthesia Physical Anesthesia Plan  ASA: III  Anesthesia Plan: General   Post-op Pain Management:    Induction: Intravenous  PONV Risk Score and Plan: 4 or greater and Ondansetron, Dexamethasone and Scopolamine patch - Pre-op  Airway Management Planned: Oral ETT  Additional Equipment: Arterial line  Intra-op Plan:   Post-operative Plan:   Informed Consent: I have reviewed the patients History and Physical, chart, labs and discussed the procedure including the risks, benefits and alternatives for the proposed anesthesia with the patient or authorized representative who has indicated his/her understanding and acceptance.     Dental advisory given  Plan Discussed with: CRNA and Surgeon  Anesthesia Plan Comments:         Anesthesia Quick Evaluation

## 2019-07-09 NOTE — Progress Notes (Signed)
[place] for Heparin Indication: DVT  Allergies  Allergen Reactions  . Penicillins Rash  . Morphine And Related Nausea And Vomiting    Patient Measurements: Height: 5\' 11"  (180.3 cm) Weight: 103.5 kg (228 lb 2.8 oz) IBW/kg (Calculated) : 70.8 Heparin Dosing Weight: 92.4 kg  Vital Signs: Temp: 98.4 F (36.9 C) (04/27 0801) Temp Source: Oral (04/27 0801) BP: 98/67 (04/27 0700) Pulse Rate: 81 (04/27 0801)  Labs: Recent Labs    07/07/19 0834 07/07/19 1624 07/08/19 0217 07/08/19 0539 07/08/19 2000 07/08/19 2000 07/08/19 2319 07/09/19 0548  HGB 13.3  --  13.9   < > 12.6   < > 12.0 11.4*  HCT 41.2  --  42.9   < > 39.4  --  36.8 36.2  PLT 163  --  151   < > 135*  --  112* 117*  LABPROT 13.6  --  13.7  --   --   --   --   --   INR 1.1  --  1.1  --   --   --   --   --   HEPARINUNFRC  --    < > 1.58*   < > 0.44  --  0.30 0.23*  CREATININE 1.05*  --  1.06*  --   --   --   --  0.75   < > = values in this interval not displayed.    Estimated Creatinine Clearance: 90.4 mL/min (by C-G formula based on SCr of 0.75 mg/dL).  Medications:  . sodium chloride    . alteplase (LIMB ISCHEMIA) 10 mg in normal saline (0.02 mg/mL) infusion 0.5 mg/hr (07/09/19 0600)  . alteplase (LIMB ISCHEMIA) 10 mg in normal saline (0.02 mg/mL) infusion 0.5 mg/hr (07/09/19 0600)  . heparin 800 Units/hr (07/09/19 0330)   Assessment: 65 yof that presented to the ED with right foot pain. The patient has a hx of DVT/PE in the past and has been off warfarin for ~ 5 years. She is s/p US guided cannulation R/L small saphenous veins and bilateral LE lytic catheter placement infusing at 0.5 mg/hr. No bleeding noted.  4/27 AM: Heparin level is 0.23, therapeutic on heparin gtt of 800 units/hr. No bleeding noted. Hbg 11.4, plt 117 (both trending down).   Goal of Therapy:  Heparin level 0.2-0.5 units/ml Monitor platelets by anticoagulation protocol: Yes   Plan:  Continue  heparin drip at 800 units/hr  Monitor daily heparin level, CBC, and s/sx of bleeding  Acey Lav, PharmD  PGY1 [place] Resident 07/09/2019 8:20 AM  **Pharmacist phone directory can be found on [email or web address] listed under [place]**

## 2019-07-09 NOTE — Transfer of Care (Signed)
Immediate Anesthesia Transfer of Care Note  Patient: Barbara Thomas  Procedure(s) Performed: Bilateral ultrasound guided Cannulation of Internal Jugular,, Venogram External iliac,  common Femoral arteries, Angioplasty of Bilateral Common femoral and iliacs, ANGIOVAC WITH CIRC ARREST x 21 minutes, VENOUS THROMBECTOMY OF IVC, Iliacs and Femoral Vein WITH IVUS with Removal of IVC Filter (Bilateral Neck)  Patient Location: PACU  Anesthesia Type:General  Level of Consciousness: drowsy and responds to stimulation  Airway & Oxygen Therapy: Patient Spontanous Breathing and Patient connected to nasal cannula oxygen  Post-op Assessment: Report given to RN and Post -op Vital signs reviewed and stable  Post vital signs: Reviewed and stable  Last Vitals:  Vitals Value Taken Time  BP 127/95 07/09/19 1544  Temp    Pulse 87 07/09/19 1545  Resp 18 07/09/19 1545  SpO2 100 % 07/09/19 1545  Vitals shown include unvalidated device data.  Last Pain:  Vitals:   07/09/19 0801  TempSrc: Oral  PainSc:       Patients Stated Pain Goal: 0 ([id number])  Complications: No apparent anesthesia complications

## 2019-07-09 NOTE — Progress Notes (Addendum)
VASCULAR SURGERY:  Has had some hypotension requiring neo-.  She has completed 2 units of PRBC's  but is complaining of some abdominal pain.  We will get a stat CT abdomen pelvis with contrast.  CT of the abdomen pelvis appears to show some stranding in the mesentery.  This has not yet been read by radiology.  However for now I will hold her heparin.  I have discussed this with Dr. Trula Slade.  I have also updated the son was at the bedside.  We will continue to hydrate her aggressively and her follow-up CBC is pending.  Deitra Mayo, MD Office: [phone]

## 2019-07-09 NOTE — Progress Notes (Signed)
    Subjective  - POD #1, status post placement of bilateral venous thrombolytic catheters  No overnight events   Physical Exam:  Bilateral lower extremities are warm well perfused Catheter sites without complication Nonlabored breathing.       Assessment/Plan:  POD #1  Patient's husband was at the bedside.  I discussed that we would proceed with mechanical thrombectomy using the AngioJet device today in the operating room.  I discussed the details of the procedure and that we would try to remove her vena cava filter.  All questions were answered.  Her procedure is scheduled around noon.  Wells Destry Dauber 07/09/2019 8:29 AM --  Vitals:   07/09/19 0700 07/09/19 0801  BP: 98/67   Pulse: 68 81  Resp: (!) 21 12  Temp:  98.4 F (36.9 C)  SpO2: 98% 100%    Intake/Output Summary (Last 24 hours) at 07/09/2019 0829 Last data filed at 07/09/2019 0650 Gross per 24 hour  Intake 1811.52 ml  Output 500 ml  Net 1311.52 ml     Laboratory CBC    Component Value Date/Time   WBC 6.9 07/09/2019 0548   HGB 11.4 (L) 07/09/2019 0548   HGB 14.1 03/13/2013 1028   HCT 36.2 07/09/2019 0548   HCT 43.5 03/13/2013 1028   PLT 117 (L) 07/09/2019 0548   PLT 225 03/13/2013 1028    BMET    Component Value Date/Time   NA 136 07/09/2019 0548   K 3.6 07/09/2019 0548   CL 104 07/09/2019 0548   CO2 21 (L) 07/09/2019 0548   GLUCOSE 231 (H) 07/09/2019 0548   BUN 13 07/09/2019 0548   CREATININE 0.75 07/09/2019 0548   CALCIUM 8.5 (L) 07/09/2019 0548   GFRNONAA >60 07/09/2019 0548   GFRAA >60 07/09/2019 0548    COAG Lab Results  Component Value Date   INR 1.1 07/08/2019   INR 1.1 07/07/2019   INR 0.95 11/21/2013   No results found for: PTT  Antibiotics Anti-infectives (From admission, onward)   Start     Dose/Rate Route Frequency Ordered Stop   07/07/19 0815  sulfamethoxazole-trimethoprim (BACTRIM DS) 800-160 MG per tablet 1 tablet  Status:  Discontinued     1 tablet Oral  Once  07/07/19 0804 07/07/19 0804       V. Leia Alf, M.D., [facility] [place] Office: [phone] Pager:  [phone]

## 2019-07-09 NOTE — Anesthesia Procedure Notes (Signed)
Arterial Line Insertion Start/End4/27/2021 12:16 PM Performed by: Amadeo Garnet, CRNA, CRNA  Preanesthetic checklist: patient identified, IV checked, risks and benefits discussed, surgical consent and monitors and equipment checked Left, radial was placed Catheter size: 20 G Hand hygiene performed  and maximum sterile barriers used  Allen's test indicative of satisfactory collateral circulation Attempts: 1 Procedure performed without using ultrasound guided technique. Following insertion, dressing applied and Biopatch. Post procedure assessment: normal  Patient tolerated the procedure well with no immediate complications.

## 2019-07-09 NOTE — Progress Notes (Signed)
PROGRESS NOTE  Barbara Thomas [id number] DOB: May 10, 1950 DOA: 07/07/2019 PCP: Ann Held, DO  HPI/Recap of past 24 hours:  She is in OR  Assessment/Plan: Principal Problem:   Venous thromboembolism confirmed by diagnostic testing Active Problems:   Obesity (BMI 30-39.9)   OSA (obstructive sleep apnea)   Hyperlipidemia LDL goal <100   Diabetes mellitus type 2 in obese (HCC)   Nausea   Acute respiratory failure with hypoxia (HCC)  Acute IVC filter occlusion with bilateral lower extremity DVT S/p Placement of bilateral lower extremity lytic catheter 50 cm treatment length from the filter to the mid femoral vein Currently in the OR Plan per vascular surgery  Diabetes, patient report was never treated previously -A1c 11.2 -Restarted on insulin since admission  Obesity/OSA on nightly CPAP at home Body mass index is 31.82 kg/m.    DVT Prophylaxis: Anticoagulated as above  Code Status: Full  Family Communication:   Disposition Plan:    Patient came from:                    Home                                                                                       Anticipated d/c place:  TBD  Barriers to d/c OR conditions which need to be met to effect a safe d/c:  Not ready to discharge  Consultants:  Vascular surgery  Procedures:  As above  Antibiotics:  None   Objective: BP 98/67   Pulse 68   Temp 97.6 F (36.4 C) (Axillary)   Resp (!) 21   Ht '5\' 11"'  (1.803 m)   Wt 103.5 kg   SpO2 98%   BMI 31.82 kg/m   Intake/Output Summary (Last 24 hours) at 07/09/2019 0802 Last data filed at 07/09/2019 0650 Gross per 24 hour  Intake 1811.52 ml  Output 500 ml  Net 1311.52 ml   Filed Weights   07/07/19 0150 07/07/19 1612  Weight: 101.6 kg 103.5 kg    Patient is in OR all day Data Reviewed: Basic Metabolic Panel: Recent Labs  Lab 07/07/19 0834 07/08/19 0217 07/09/19 0548  NA 133* 135 136  K 4.1 3.9 3.6  CL 97* 97* 104  CO2 22 25  21*  GLUCOSE 427* 251* 231*  BUN 22 27* 13  CREATININE 1.05* 1.06* 0.75  CALCIUM 9.8 9.4 8.5*   Liver Function Tests: Recent Labs  Lab 07/07/19 0834  AST 16  ALT 28  ALKPHOS 122  BILITOT 1.1  PROT 7.5  ALBUMIN 4.1   No results for input(s): LIPASE, AMYLASE in the last 168 hours. No results for input(s): AMMONIA in the last 168 hours. CBC: Recent Labs  Lab 07/07/19 0834 07/08/19 0217 07/08/19 2000 07/08/19 2319 07/09/19 0548  WBC 10.2 10.7* 10.4 8.5 6.9  NEUTROABS 7.6  --   --   --   --   HGB 13.3 13.9 12.6 12.0 11.4*  HCT 41.2 42.9 39.4 36.8 36.2  MCV 86.9 87.9 87.9 87.2 88.9  PLT 163 151 135* 112* 117*   Cardiac Enzymes:   No results for input(s): CKTOTAL,  CKMB, CKMBINDEX, TROPONINI in the last 168 hours. BNP (last 3 results) No results for input(s): BNP in the last 8760 hours.  ProBNP (last 3 results) No results for input(s): PROBNP in the last 8760 hours.  CBG: Recent Labs  Lab 07/08/19 0644 07/08/19 1210 07/08/19 2205 07/09/19 0017 07/09/19 0503  GLUCAP 252* 242* 269* 257* 238*    Recent Results (from the past 240 hour(s))  SARS CORONAVIRUS 2 (TAT 6-24 HRS) Nasopharyngeal Nasopharyngeal Swab     Status: None   Collection Time: 07/07/19  3:33 PM   Specimen: Nasopharyngeal Swab  Result Value Ref Range Status   SARS Coronavirus 2 NEGATIVE NEGATIVE Final    Comment: (NOTE) SARS-CoV-2 target nucleic acids are NOT DETECTED. The SARS-CoV-2 RNA is generally detectable in upper and lower respiratory specimens during the acute phase of infection. Negative results do not preclude SARS-CoV-2 infection, do not rule out co-infections with other pathogens, and should not be used as the sole basis for treatment or other patient management decisions. Negative results must be combined with clinical observations, patient history, and epidemiological information. The expected result is Negative. Fact Sheet for Patients: SugarRoll.be  Fact Sheet for Healthcare Providers: https://www.woods-mathews.com/ This test is not yet approved or cleared by the [place] FDA and  has been authorized for detection and/or diagnosis of SARS-CoV-2 by FDA under an Emergency Use Authorization (EUA). This EUA will remain  in effect (meaning this test can be used) for the duration of the COVID-19 declaration under Section 56 4(b)(1) of the Act, 21 U.S.C. section 360bbb-3(b)(1), unless the authorization is terminated or revoked sooner. Performed at [facility], [place] [address] [place], [place] [address]   MRSA PCR Screening     Status: None   Collection Time: 07/08/19  5:40 PM   Specimen: Nasal Mucosa; Nasopharyngeal  Result Value Ref Range Status   MRSA by PCR NEGATIVE NEGATIVE Final    Comment:        The GeneXpert MRSA Assay (FDA approved for NASAL specimens only), is one component of a comprehensive MRSA colonization surveillance program. It is not intended to diagnose MRSA infection nor to guide or monitor treatment for MRSA infections. Performed at [facility], [place] [address] [place], [place] [address]      Studies: PERIPHERAL VASCULAR CATHETERIZATION  Result Date: 07/08/2019 Patient name: Barbara Thomas MRN: [id number] DOB: 04/01/1950 Sex: female 07/08/2019 Pre-operative Diagnosis: Acute IVC filter occlusion with bilateral lower extremity DVT Post-operative diagnosis:  Same Surgeon:  Eda Paschal. Donzetta Matters, MD Procedure Performed: 1.  Ultrasound-guided cannulation right small saphenous vein 2.  Ultrasound-guided cannulation left small saphenous vein 3.  Central venogram 4.  Placement of bilateral lower extremity lytic catheter 50 cm treatment length from the filter to the mid femoral vein 5.  Moderate sedation with fentanyl and Versed for 29 minutes Indications: 39 female with history of IVC filter placement.  This is now occluded by CT and duplex has bilateral lower extremity DVT is indicated for lysis with  possible filter removal the following day Findings: The left small saphenous vein was patent and compressible this was cannulated and wire was placed centrally.  The right small saphenous vein was occluded we are able to cannulate it and placed a wire centrally.  IVC above the filter was patent and to 50 cm treatment length catheters were placed from the femoral vein to the IVC filter  Procedure:  The patient was identified in the holding area and taken to room 8.  The patient was then placed prone on the procedural table sterilely prepped and draped her bilateral popliteal fossae and timeout was called.  We then cannulated the left small saphenous vein with micropuncture needle followed by wire sheath and placed a 5 French sheath.  We were able to cross centrally to the super filter IVC using very catheter Glidewire advantage confirmed intraluminal access with venogram.  50 cm treatment length catheter was then placed from the filter to the femoral vein and flushed.  Similarly on the left side we cannulated the small saphenous vein which was not compressible we were able to get in with micropuncture needle and wire and sheath.  5 French sheath was placed over Glidewire advantage.  We used very catheter Glidewire advantage to traverse centrally confirmed intraluminal access above the filter.  Another 50 cm treatment length catheter was placed.  This was all done with patient heparinized.  We placed Dermabond at the size of the sheath and placed arm fusion wires in both catheters and flushed them.  These will be hooked to TPA overnight patient will remain heparinized.  She tolerated procedure without any complication. Contrast:  15cc Brandon C. Donzetta Matters, MD [facility] [place] Office: [phone] Pager: [phone]   Scheduled Meds: . Chlorhexidine Gluconate Cloth  6 each Topical Daily  . docusate sodium  100 mg Oral BID  . insulin aspart  0-15 Units Subcutaneous TID WC  . insulin aspart   0-5 Units Subcutaneous QHS  . insulin glargine  10 Units Subcutaneous QHS  . living well with diabetes book   Does not apply Once  . metoprolol tartrate  5 mg Intravenous Q6H  . montelukast  10 mg Oral QHS  . oxymetazoline  1 spray Each Nare BID  . simvastatin  40 mg Oral QHS  . sodium chloride flush  3 mL Intravenous Q12H  . sodium chloride flush  3 mL Intravenous Q12H    Continuous Infusions: . sodium chloride    . alteplase (LIMB ISCHEMIA) 10 mg in normal saline (0.02 mg/mL) infusion 0.5 mg/hr (07/09/19 0600)  . alteplase (LIMB ISCHEMIA) 10 mg in normal saline (0.02 mg/mL) infusion 0.5 mg/hr (07/09/19 0600)  . heparin 800 Units/hr (07/09/19 0330)      Florencia Reasons MD, PhD, FACP  [facility]  Available via Epic secure chat 7am-7pm for nonurgent issues Please page for urgent issues, pager number available through [email or web address] .   07/09/2019, 8:02 AM  LOS: 2 days

## 2019-07-09 NOTE — Op Note (Signed)
Patient name: Barbara Thomas MRN: [id number] DOB: [birth year] Sex: female  07/09/2019 Pre-operative Diagnosis: DVT, Occluded IVC filter Post-operative diagnosis:  Same Surgeon:  Annamarie Major Co-Surgeon:  Servando Snare Procedure:   #1: Ultrasound-guided percutaneous access, right internal jugular vein   #2: Ultrasound-guided percutaneous access, left internal jugular vein   #3: Mechanical thrombectomy using the AngioJet device of the inferior vena cava, bilateral common iliac, external iliac, common femoral, and femoral veins   #4: The veno-veno extracorporeal bypass (21 minutes)   #5: Removal of inferior vena cava filter   #6: Intravascular ultrasound (IVUS of the inferior vena cava, bilateral common iliac veins, bilateral external iliac veins, bilateral common femoral veins, bilateral femoral veins   #7: Venogram of bilateral femoral, common femoral, external iliac, common iliac veins, and inferior vena cava   #8: Venoplasty of bilateral common iliac, external iliac, and common femoral veins Anesthesia:  General Blood Loss:  100 Specimens:  none  Findings: Near complete removal of thrombus within the femoral iliac and inferior vena cava.  The filter was easily removed.  Indications: The patient has a history of DVT and IVC filter.  She recently underwent a prolonged car ride and developed swelling in her right leg which brought her to the hospital.  She was found to have an occluded inferior vena cava up to her filter as well as occlusion of bilateral common and external iliac veins and distal thrombus.  Yesterday she underwent bilateral popliteal vein access and overnight administration of thrombolytic therapy.  She comes in today for definitive treatment.  Procedure:  The patient was identified in the holding area and taken to [facility]  The patient was then placed supine on the table. general anesthesia was administered.  The patient was prepped and draped in the usual sterile fashion.   A time out was called and antibiotics were administered.  Ultrasound was used to evaluate bilateral internal jugular veins which were widely patent and easily compressible.  A digital ultrasound image was acquired.  A #11 blade was used to make a skin nick on both sides of the neck and bilateral internal jugular veins were cannulated under ultrasound guidance with an 18-gauge needle.  A Bentson wire was inserted into the inferior vena cava using a Berenstein 2 catheter.  Amplatz superstiff wires were placed.  A 26 French dry seal sheath was advanced through the right internal jugular vein down below the inferior vena cava filter.  A venous return cannula was placed into the left internal jugular vein.  Next, Amplatz superstiff wires were advanced through the thrombolytic catheters from the popliteal vein cannulation sites bilaterally.  Intravascular ultrasound was used to evaluate bilateral femoral, common femoral, external iliac, common iliac veins and the inferior vena cava.  This showed residual thrombus within the filter and in the iliac and femoral veins.  However there was a good result from overnight thrombolytic therapy.  Next, the angio [facility] device was inserted through the 26 French sheath down below the filter.  Venovenous bypass was initiated after heparin bolus with an ACT greater than 400.  A total of 21 minutes of bypass time was required.  The AngioJet device was used to perform mechanical thrombectomy of the inferior vena cava, right common and external iliac veins as well as the right common femoral vein.  It was also directed into the left common iliac vein for mechanical thrombectomy.  Next, a cleaner wire was inserted through the popliteal sheath to perform mechanical thrombectomy  of bilateral femoral, common femoral, external iliac, and common iliac veins as well as inferior vena cava including the filter.  IVUS was then used which showed an excellent result.  We then withdrew the angio back to  above the filter and used the cleaner wire to clean out the filter.  IVUS was then used that showed an excellent result.  There was significant narrowing within bilateral common and external iliac veins and so a 12 x 60 balloon was used to perform balloon venoplasty of the bilateral common iliac external iliac and common femoral veins.  IVUS was then reinserted to evaluate the treated segments which showed excellent results with no real residual thrombus.  At this point, we decided to remove the filter.  Since there was no thrombus within the filter the AngioJet device was disconnected as well as venovenous bypass and the angio VAC was removed through the 26 [place] sheath.  Next, a 10 mm gooseneck snare was inserted through the 26 French sheath and used to grab the hook on the filter.  The filter was then removed relatively easily.  It was inspected once it was outside of the body and it was found to be intact.  We then performed venography by injecting contrast through the sheaths in the popliteal veins.  This images bilateral femoral, common femoral, external iliac and common iliac veins as well as inferior vena cava.  This showed no residual thrombus and a widely patent vena cava.  There is no evidence of extravasation from filter removal.  There was a small blush from a collateral vein on the right lateral side of the vena cava.  Satisfied with these results we elected to stop the procedure.  This sheaths were removed from all 4 locations and the skin exit sites were closed using 2-0 nylon suture U stitch over top of a red rubber catheter.  Sterile dressings were applied.  The patient tolerated procedure well there were no immediate complications.   Disposition: To PACU stable   V. Annamarie Major, M.D., [facility] [place] Office: [phone] Pager:  [phone]

## 2019-07-09 NOTE — Progress Notes (Signed)
Pt arrived on unit from PACU complaining of nausea and stomach pain. Skin cool and pale.  Doppler pulses bilaterally.  BP progressively dropped to 75/61.  Pt reverse trendelenberg and neo increased.   ISTAT Hgb 7.1.  CBC sent stat.  Scot Dock MD paged and ordered 580ml bolus and 2 RBC stat.  Awaiting MD to come to bedside.  Will continue to monitor.

## 2019-07-09 NOTE — Progress Notes (Signed)
[place] for Heparin Indication: DVT  Allergies  Allergen Reactions  . Penicillins Rash  . Morphine And Related Nausea And Vomiting    Patient Measurements: Height: 5\' 11"  (180.3 cm) Weight: 103.5 kg (228 lb 2.8 oz) IBW/kg (Calculated) : 70.8 Heparin Dosing Weight: 92.4 kg  Vital Signs: Temp: 98.4 F (36.9 C) (04/26 2034) Temp Source: Oral (04/26 2034) BP: 127/62 (04/26 2300) Pulse Rate: 80 (04/26 2335)  Labs: Recent Labs    07/07/19 0834 07/07/19 1624 07/08/19 0217 07/08/19 0217 07/08/19 0539 07/08/19 1310 07/08/19 2000 07/08/19 2319  HGB 13.3  --  13.9   < >  --   --  12.6 12.0  HCT 41.2  --  42.9  --   --   --  39.4 36.8  PLT 163  --  151  --   --   --  135* PENDING  LABPROT 13.6  --  13.7  --   --   --   --   --   INR 1.1  --  1.1  --   --   --   --   --   HEPARINUNFRC  --    < > 1.58*  --    < > 0.78* 0.44 0.30  CREATININE 1.05*  --  1.06*  --   --   --   --   --    < > = values in this interval not displayed.    Estimated Creatinine Clearance: 68.2 mL/min (A) (by C-G formula based on SCr of 1.06 mg/dL (H)).   Medical History: Past Medical History:  Diagnosis Date  . Allergy   . Arthritis   . Blood transfusion without reported diagnosis    1987  . Cancer (HCC)    Basal Cell Carcinoma  . Cataract   . Chronic kidney disease    kidney stone once  . H/O hiatal hernia   . History of basal cell carcinoma excision    NOSE  . History of benign bladder tumor   . History of DVT of lower extremity    11/ 2011  BILATERAL  POST FOOT SURGERY  . History of pulmonary embolus (PE)    12/ 2011   POST FOOT SURGERY  . Hyperlipidemia   . Left ureteral calculus   . Migraines   . OSA on CPAP    STUDY DONE 2012  . PONV (postoperative nausea and vomiting)    severe  . Sigmoid diverticulosis   . Wears glasses     Medications:  Scheduled:  . Chlorhexidine Gluconate Cloth  6 each Topical Daily  . docusate sodium  100 mg  Oral BID  . insulin aspart  0-15 Units Subcutaneous TID WC  . insulin aspart  0-5 Units Subcutaneous QHS  . insulin glargine  10 Units Subcutaneous QHS  . living well with diabetes book   Does not apply Once  . metoprolol tartrate  5 mg Intravenous Q6H  . montelukast  10 mg Oral QHS  . oxymetazoline  1 spray Each Nare BID  . simvastatin  40 mg Oral QHS  . sodium chloride flush  3 mL Intravenous Q12H  . sodium chloride flush  3 mL Intravenous Q12H    Assessment: Patient is a 59 yof that presented to the ED with right foot pain. The patient has a hx of DVT/PE in the past and has been off Warfarin for ~ 5 years.  4/27 AM update:  Heparin level therapeutic x  2  Now undergoing lysis with alteplase running, has lower heparin goal   Goal of Therapy:  Heparin level 0.2-0.5 units/mL Monitor platelets by anticoagulation protocol: Yes   Plan:  Cont heparin at 800 units/hr Heparin level q6h while undergoing lysis  Narda Bonds, PharmD, BCPS Clinical Pharmacist Phone: [phone]

## 2019-07-09 NOTE — Progress Notes (Signed)
Pt stated she is feeling sick, not placed on QHS cpap at this time. RT informed pt if she feels better later in the night to call for RT to place pt on cpap.

## 2019-07-09 NOTE — Progress Notes (Signed)
Nutrition Brief Note  Dietitian consult received for DM education. Pt returning to OR today, out of room on visit.  Post-op today would not be an appropriate time for diet education.   Pt currently NPO, previously on Carb Modified diet x 1 meal and ate 25%.   Lab Results  Component Value Date   HGBA1C 11.2 (H) 07/07/2019     Wt Readings from Last 15 Encounters:  07/07/19 103.5 kg  11/27/17 104.1 kg  01/17/17 104.8 kg  01/03/17 104.9 kg  08/30/16 104 kg  08/25/16 103.9 kg  06/11/16 103 kg  06/11/16 103 kg  02/16/16 101.8 kg  08/21/15 101.6 kg  07/30/15 101.6 kg  07/15/14 101 kg  07/11/14 100.2 kg  05/22/14 103.4 kg  03/27/14 103 kg    Body mass index is 31.82 kg/m.  No weight loss noted per weight encounters.   RD to follow-up and provide DM education when able as appropriate.   Kerman Passey MS, RDN, LDN, CNSC RD Pager Number and RD On-Call Pager Number Located in [place]

## 2019-07-10 ENCOUNTER — Encounter: Payer: Self-pay | Admitting: *Deleted

## 2019-07-10 LAB — CBC
HCT: 30.2 % — ABNORMAL LOW (ref 36.0–46.0)
HCT: 27.8 % — ABNORMAL LOW (ref 36.0–46.0)
HCT: 23.6 % — ABNORMAL LOW (ref 36.0–46.0)
HCT: 22.7 % — ABNORMAL LOW (ref 36.0–46.0)
Hemoglobin: 10.3 g/dL — ABNORMAL LOW (ref 12.0–15.0)
Hemoglobin: 9.4 g/dL — ABNORMAL LOW (ref 12.0–15.0)
Hemoglobin: 8 g/dL — ABNORMAL LOW (ref 12.0–15.0)
Hemoglobin: 7.7 g/dL — ABNORMAL LOW (ref 12.0–15.0)
MCH: 29.9 pg (ref 26.0–34.0)
MCH: 29.6 pg (ref 26.0–34.0)
MCH: 29.3 pg (ref 26.0–34.0)
MCH: 30 pg (ref 26.0–34.0)
MCHC: 34.1 g/dL (ref 30.0–36.0)
MCHC: 33.8 g/dL (ref 30.0–36.0)
MCHC: 33.9 g/dL (ref 30.0–36.0)
MCHC: 33.9 g/dL (ref 30.0–36.0)
MCV: 87.8 fL (ref 80.0–100.0)
MCV: 87.4 fL (ref 80.0–100.0)
MCV: 86.4 fL (ref 80.0–100.0)
MCV: 88.3 fL (ref 80.0–100.0)
Platelets: 88 10*3/uL — ABNORMAL LOW (ref 150–400)
Platelets: 98 10*3/uL — ABNORMAL LOW (ref 150–400)
Platelets: 91 10*3/uL — ABNORMAL LOW (ref 150–400)
Platelets: 102 10*3/uL — ABNORMAL LOW (ref 150–400)
RBC: 3.44 MIL/uL — ABNORMAL LOW (ref 3.87–5.11)
RBC: 3.18 MIL/uL — ABNORMAL LOW (ref 3.87–5.11)
RBC: 2.73 MIL/uL — ABNORMAL LOW (ref 3.87–5.11)
RBC: 2.57 MIL/uL — ABNORMAL LOW (ref 3.87–5.11)
RDW: 14.7 % (ref 11.5–15.5)
RDW: 15.4 % (ref 11.5–15.5)
RDW: 15.9 % — ABNORMAL HIGH (ref 11.5–15.5)
RDW: 16.2 % — ABNORMAL HIGH (ref 11.5–15.5)
WBC: 11.7 10*3/uL — ABNORMAL HIGH (ref 4.0–10.5)
WBC: 11.7 10*3/uL — ABNORMAL HIGH (ref 4.0–10.5)
WBC: 9.9 10*3/uL (ref 4.0–10.5)
WBC: 11.4 10*3/uL — ABNORMAL HIGH (ref 4.0–10.5)
nRBC: 0.2 % (ref 0.0–0.2)
nRBC: 0.3 % — ABNORMAL HIGH (ref 0.0–0.2)
nRBC: 0.4 % — ABNORMAL HIGH (ref 0.0–0.2)
nRBC: 0.5 % — ABNORMAL HIGH (ref 0.0–0.2)

## 2019-07-10 LAB — POCT I-STAT 7, (LYTES, BLD GAS, ICA,H+H)
Acid-base deficit: 1 mmol/L (ref 0.0–2.0)
Bicarbonate: 27 mmol/L (ref 20.0–28.0)
Calcium, Ion: 1.16 mmol/L (ref 1.15–1.40)
HCT: 25 % — ABNORMAL LOW (ref 36.0–46.0)
Hemoglobin: 8.5 g/dL — ABNORMAL LOW (ref 12.0–15.0)
O2 Saturation: 99 %
Patient temperature: 35
Potassium: 3.3 mmol/L — ABNORMAL LOW (ref 3.5–5.1)
Sodium: 140 mmol/L (ref 135–145)
TCO2: 29 mmol/L (ref 22–32)
pCO2 arterial: 54.9 mmHg — ABNORMAL HIGH (ref 32.0–48.0)
pH, Arterial: 7.289 — ABNORMAL LOW (ref 7.350–7.450)
pO2, Arterial: 141 mmHg — ABNORMAL HIGH (ref 83.0–108.0)

## 2019-07-10 LAB — GLUCOSE, CAPILLARY
Glucose-Capillary: 241 mg/dL — ABNORMAL HIGH (ref 70–99)
Glucose-Capillary: 264 mg/dL — ABNORMAL HIGH (ref 70–99)
Glucose-Capillary: 252 mg/dL — ABNORMAL HIGH (ref 70–99)
Glucose-Capillary: 188 mg/dL — ABNORMAL HIGH (ref 70–99)

## 2019-07-10 LAB — BPAM FFP
Blood Product Expiration Date: 202105022359
ISSUE DATE / TIME: 202104272154
Unit Type and Rh: 7300

## 2019-07-10 LAB — BASIC METABOLIC PANEL
Anion gap: 15 (ref 5–15)
BUN: 16 mg/dL (ref 8–23)
CO2: 13 mmol/L — ABNORMAL LOW (ref 22–32)
Calcium: 7.1 mg/dL — ABNORMAL LOW (ref 8.9–10.3)
Chloride: 112 mmol/L — ABNORMAL HIGH (ref 98–111)
Creatinine, Ser: 0.91 mg/dL (ref 0.44–1.00)
GFR calc Af Amer: >60 mL/min (ref >60)
GFR calc non Af Amer: >60 mL/min (ref >60)
Glucose, Bld: 264 mg/dL — ABNORMAL HIGH (ref 70–99)
Potassium: 3.9 mmol/L (ref 3.5–5.1)
Sodium: 140 mmol/L (ref 135–145)

## 2019-07-10 LAB — PREPARE FRESH FROZEN PLASMA

## 2019-07-10 LAB — HEPARIN LEVEL (UNFRACTIONATED): Heparin Unfractionated: <0.1 IU/mL — ABNORMAL LOW (ref 0.30–0.70)

## 2019-07-10 LAB — POCT ACTIVATED CLOTTING TIME
Activated Clotting Time: 131 seconds
Activated Clotting Time: 422 seconds
Activated Clotting Time: 378 seconds

## 2019-07-10 MED ORDER — PHENTOLAMINE MESYLATE 5 MG IJ SOLR
5.0000 mg | Freq: Once | INTRAMUSCULAR | Status: AC
  Administered 2019-07-10: 5 mg via SUBCUTANEOUS
  Filled 2019-07-10: qty 5

## 2019-07-10 MED ORDER — KETOROLAC TROMETHAMINE 15 MG/ML IJ SOLN
7.5000 mg | Freq: Four times a day (QID) | INTRAMUSCULAR | Status: DC | PRN
  Administered 2019-07-10 – 2019-07-12 (×3): 7.5 mg via INTRAVENOUS
  Filled 2019-07-10 (×3): qty 1

## 2019-07-10 MED ORDER — INSULIN GLARGINE 100 UNIT/ML ~~LOC~~ SOLN
10.0000 [IU] | Freq: Two times a day (BID) | SUBCUTANEOUS | Status: DC
  Administered 2019-07-10 – 2019-07-15 (×9): 10 [IU] via SUBCUTANEOUS
  Filled 2019-07-10 (×12): qty 0.1

## 2019-07-10 MED ORDER — HEPARIN (PORCINE) 25000 UT/250ML-% IV SOLN
1200.0000 [IU]/h | INTRAVENOUS | Status: DC
  Administered 2019-07-11: 500 [IU]/h via INTRAVENOUS
  Administered 2019-07-12: 900 [IU]/h via INTRAVENOUS
  Administered 2019-07-13 – 2019-07-15 (×2): 1100 [IU]/h via INTRAVENOUS
  Filled 2019-07-10 (×5): qty 250

## 2019-07-10 MED ORDER — OXYCODONE-ACETAMINOPHEN 5-325 MG PO TABS
1.0000 | ORAL_TABLET | ORAL | Status: DC | PRN
  Administered 2019-07-10: 1 via ORAL
  Administered 2019-07-10 – 2019-07-16 (×15): 2 via ORAL
  Filled 2019-07-10 (×13): qty 2
  Filled 2019-07-10: qty 1
  Filled 2019-07-10 (×2): qty 2

## 2019-07-10 MED ORDER — NITROGLYCERIN 2 % TD OINT
1.0000 [in_us] | TOPICAL_OINTMENT | Freq: Three times a day (TID) | TRANSDERMAL | Status: AC
  Administered 2019-07-10 – 2019-07-12 (×5): 1 [in_us] via TOPICAL
  Filled 2019-07-10 (×2): qty 30

## 2019-07-10 MED ORDER — STERILE WATER FOR INJECTION IJ SOLN
INTRAMUSCULAR | Status: AC
  Administered 2019-07-10: 1 mL
  Filled 2019-07-10: qty 10

## 2019-07-10 NOTE — Progress Notes (Signed)
Maintain MAP greater than 60's per Trula Slade MD

## 2019-07-10 NOTE — Progress Notes (Addendum)
  Progress Note    07/10/2019 7:37 AM 1 Day Post-Op  Subjective:  Nauseous this morning; legs feel less tight this morning.  Denies chest pain.   Vitals:   07/10/19 0600 07/10/19 0700  BP:    Pulse: 93 (!) 101  Resp: 13 (!) 27  Temp: 100 F (37.8 C) (!) 100.4 F (38 C)  SpO2: 95% 92%   Physical Exam: Cardiac:  Tachycardic Lungs:  Non labored on RA Incisions:  Small saph cath sites of BLE without hematoma; IJ cath sites bilaterally without hematoma Extremities:  Feet are warm and well perfused Abdomen:  Soft, tender to deep palpation diffusely  Neurologic: A&O  CBC    Component Value Date/Time   WBC 11.7 (H) 07/10/2019 0102   RBC 3.44 (L) 07/10/2019 0102   HGB 10.3 (L) 07/10/2019 0102   HGB 14.1 03/13/2013 1028   HCT 30.2 (L) 07/10/2019 0102   HCT 43.5 03/13/2013 1028   PLT 88 (L) 07/10/2019 0102   PLT 225 03/13/2013 1028   MCV 87.8 07/10/2019 0102   MCV 87 03/13/2013 1028   MCH 29.9 07/10/2019 0102   MCHC 34.1 07/10/2019 0102   RDW 14.7 07/10/2019 0102   RDW 14.2 03/13/2013 1028   LYMPHSABS 1.8 07/07/2019 0834   LYMPHSABS 1.3 03/13/2013 1028   MONOABS 0.7 07/07/2019 0834   EOSABS 0.1 07/07/2019 0834   EOSABS 0.2 03/13/2013 1028   BASOSABS 0.1 07/07/2019 0834   BASOSABS 0.0 03/13/2013 1028    BMET    Component Value Date/Time   NA 140 07/10/2019 0457   K 3.9 07/10/2019 0457   CL 112 (H) 07/10/2019 0457   CO2 13 (L) 07/10/2019 0457   GLUCOSE 264 (H) 07/10/2019 0457   BUN 16 07/10/2019 0457   CREATININE 0.91 07/10/2019 0457   CALCIUM 7.1 (L) 07/10/2019 0457   GFRNONAA >60 07/10/2019 0457   GFRAA >60 07/10/2019 0457    INR    Component Value Date/Time   INR 1.1 07/08/2019 0217     Intake/Output Summary (Last 24 hours) at 07/10/2019 0737 Last data filed at 07/10/2019 0700 Gross per 24 hour  Intake 6808.13 ml  Output 2950 ml  Net 3858.13 ml     Assessment/Plan:  69 y.o. female is s/p mechanical thrombectomy of IVC, illiac, and femoral veins  bilaterally with IVC filter removal 1 Day Post-Op   Cath sites of IJ and small saphenous veins without hematoma Hypotensive overnight: transfused 2u pRBCs; Hgb responded appropriately to transfusion; continue to wean Neo as tolerated CT abd/pelvis with RP hematoma: Hgb stable this morning; continue to trend Continue heparin 500u/hr Will need compression and elevation of BLE   Dagoberto Ligas, PA-C [facility] [phone] 07/10/2019 7:37 AM  I agree with the above.  Continue to wean Neo Received 4u RBC and 1 FFP Heparin at 500 Follow CBC  Wells BRaham

## 2019-07-10 NOTE — Progress Notes (Signed)
All blood products given via bolus per MD request

## 2019-07-10 NOTE — Progress Notes (Signed)
Inpatient Diabetes Program Recommendations  AACE/ADA: New Consensus Statement on Inpatient Glycemic Control (2015)  Target Ranges:  Prepandial:   less than 140 mg/dL      Peak postprandial:   less than 180 mg/dL (1-2 hours)      Critically ill patients:  140 - 180 mg/dL   Lab Results  Component Value Date   GLUCAP 241 (H) 07/10/2019   HGBA1C 11.2 (H) 07/07/2019    Review of Glycemic Control Results for Barbara Thomas, Barbara "JUDY" (MRN [id number]) as of 07/10/2019 11:17  Ref. Range 07/09/2019 08:00 07/09/2019 11:07 07/09/2019 19:37 07/09/2019 21:06 07/10/2019 06:47  Glucose-Capillary Latest Ref Range: 70 - 99 mg/dL 219 (H) 194 (H) 283 (H) 305 (H) 241 (H)   Diabetes history: DM 2 NOTE: Patient admitted with venous thromboembolism.  Noted consult for Diabetes Coordinator for new DM dx. Per chart, patient has never been told she had DM. A1C 6.9% on 11/27/17 and 7.2% on 06/21/18. Initial lab glucose on 07/07/19 was 427 mg/dl and current A1C 11.2% on 07/06/19. Outpatient Diabetes medications:  None Current orders for Inpatient glycemic control:  Novolog moderate tid with meals and HS Lantus 10 units daily Inpatient Diabetes Program Recommendations:  Please increase Lantus to 20 units daily and consider adding Novolog 3 units tid with meals (hold if patient eats less than 50%).    Thanks  Adah Perl, RN, BC-ADM Inpatient Diabetes Coordinator Pager [phone] (8a-5p)

## 2019-07-10 NOTE — Progress Notes (Signed)
Patient is admitted to ICU on pressor ,currently is under vascular surgery care, hospitalist will follow peripherally.

## 2019-07-10 NOTE — Progress Notes (Signed)
Donzetta Matters MD notified of drop in hemoglobin at 0800 of 9.4 to 8.0 at 1400. Will continue to monitor CBC.

## 2019-07-10 NOTE — Progress Notes (Signed)
Brabham MD notified of patient's peripheral IV infiltrating from neosynephrine gtt. Awaiting regitine and nitroglycerin from pharmacy. Will place warm compresses and elevate extremity.

## 2019-07-10 NOTE — Progress Notes (Signed)
Pt placed on CPAP 7.0 CMH20 tolerating well.  Patient states this is her home regimen.

## 2019-07-11 DIAGNOSIS — E669 Obesity, unspecified: Secondary | ICD-10-CM

## 2019-07-11 DIAGNOSIS — E1169 Type 2 diabetes mellitus with other specified complication: Secondary | ICD-10-CM

## 2019-07-11 DIAGNOSIS — D62 Acute posthemorrhagic anemia: Secondary | ICD-10-CM

## 2019-07-11 DIAGNOSIS — G4733 Obstructive sleep apnea (adult) (pediatric): Secondary | ICD-10-CM

## 2019-07-11 LAB — BASIC METABOLIC PANEL
Anion gap: 6 (ref 5–15)
BUN: 11 mg/dL (ref 8–23)
CO2: 23 mmol/L (ref 22–32)
Calcium: 7.7 mg/dL — ABNORMAL LOW (ref 8.9–10.3)
Chloride: 108 mmol/L (ref 98–111)
Creatinine, Ser: 0.72 mg/dL (ref 0.44–1.00)
GFR calc Af Amer: >60 mL/min (ref >60)
GFR calc non Af Amer: >60 mL/min (ref >60)
Glucose, Bld: 201 mg/dL — ABNORMAL HIGH (ref 70–99)
Potassium: 3.5 mmol/L (ref 3.5–5.1)
Sodium: 137 mmol/L (ref 135–145)

## 2019-07-11 LAB — CBC
HCT: 21.3 % — ABNORMAL LOW (ref 36.0–46.0)
HCT: 30.9 % — ABNORMAL LOW (ref 36.0–46.0)
Hemoglobin: 7.2 g/dL — ABNORMAL LOW (ref 12.0–15.0)
Hemoglobin: 10.1 g/dL — ABNORMAL LOW (ref 12.0–15.0)
MCH: 30.3 pg (ref 26.0–34.0)
MCH: 28.5 pg (ref 26.0–34.0)
MCHC: 33.8 g/dL (ref 30.0–36.0)
MCHC: 32.7 g/dL (ref 30.0–36.0)
MCV: 89.5 fL (ref 80.0–100.0)
MCV: 87 fL (ref 80.0–100.0)
Platelets: 99 10*3/uL — ABNORMAL LOW (ref 150–400)
Platelets: 112 10*3/uL — ABNORMAL LOW (ref 150–400)
RBC: 2.38 MIL/uL — ABNORMAL LOW (ref 3.87–5.11)
RBC: 3.55 MIL/uL — ABNORMAL LOW (ref 3.87–5.11)
RDW: 16.4 % — ABNORMAL HIGH (ref 11.5–15.5)
RDW: 17 % — ABNORMAL HIGH (ref 11.5–15.5)
WBC: 9.4 10*3/uL (ref 4.0–10.5)
WBC: 9.8 10*3/uL (ref 4.0–10.5)
nRBC: 0.9 % — ABNORMAL HIGH (ref 0.0–0.2)
nRBC: 1.6 % — ABNORMAL HIGH (ref 0.0–0.2)

## 2019-07-11 LAB — PROTIME-INR
INR: 1.1 (ref 0.8–1.2)
Prothrombin Time: 13.4 seconds (ref 11.4–15.2)

## 2019-07-11 LAB — GLUCOSE, CAPILLARY
Glucose-Capillary: 169 mg/dL — ABNORMAL HIGH (ref 70–99)
Glucose-Capillary: 202 mg/dL — ABNORMAL HIGH (ref 70–99)
Glucose-Capillary: 240 mg/dL — ABNORMAL HIGH (ref 70–99)
Glucose-Capillary: 198 mg/dL — ABNORMAL HIGH (ref 70–99)

## 2019-07-11 LAB — MAGNESIUM: Magnesium: 1.8 mg/dL (ref 1.7–2.4)

## 2019-07-11 LAB — PREPARE RBC (CROSSMATCH)

## 2019-07-11 LAB — HEPARIN LEVEL (UNFRACTIONATED)
Heparin Unfractionated: <0.1 IU/mL — ABNORMAL LOW (ref 0.30–0.70)
Heparin Unfractionated: <0.1 IU/mL — ABNORMAL LOW (ref 0.30–0.70)

## 2019-07-11 MED ORDER — GLUCERNA SHAKE PO LIQD
237.0000 mL | Freq: Three times a day (TID) | ORAL | Status: DC
  Administered 2019-07-11 – 2019-07-12 (×2): 237 mL via ORAL

## 2019-07-11 MED ORDER — SENNOSIDES-DOCUSATE SODIUM 8.6-50 MG PO TABS
1.0000 | ORAL_TABLET | Freq: Two times a day (BID) | ORAL | Status: DC
  Administered 2019-07-11 – 2019-07-15 (×8): 1 via ORAL
  Filled 2019-07-11 (×9): qty 1

## 2019-07-11 MED ORDER — POLYETHYLENE GLYCOL 3350 17 G PO PACK: 17.0000 g | PACK | Freq: Every day | ORAL | Status: DC

## 2019-07-11 MED FILL — Sodium Chloride IV Soln 0.9%: INTRAVENOUS | Qty: 1000 | Status: AC

## 2019-07-11 NOTE — Progress Notes (Signed)
PROGRESS NOTE  Barbara Thomas [id number] DOB: 06-Aug-1950 DOA: 07/07/2019 PCP: Ann Held, DO  HPI/Recap of past 24 hours:  Off pressure, bp low normal, sinus tachycardia on telemetry Report poor appetite, no BM for a week Family at bedside  Assessment/Plan: Principal Problem:   Venous thromboembolism confirmed by diagnostic testing Active Problems:   Obesity (BMI 30-39.9)   OSA (obstructive sleep apnea)   Hyperlipidemia LDL goal <100   Diabetes mellitus type 2 in obese (HCC)   Nausea   Acute respiratory failure with hypoxia (HCC)  Acute IVC filter occlusion with bilateral lower extremity DVT S/p Placement of bilateral lower extremity lytic catheter 50 cm treatment length from the filter to the mid femoral vein Now postoperative day 2 from IVC filter removal and venous thrombectomy Retroperitoneal bleeding/required pressor support and PRBC transfusion Currently on heparin drip Plan per vascular surgery  Acute blood loss anemia -Status post PRBC transfusion  Diabetes, patient report was never treated previously -A1c 11.2 -started on insulin since admission, continue adjust insulin -Diabetes and diet education  Obesity/OSA on nightly CPAP at home Body mass index is 31.82 kg/m.    DVT Prophylaxis: Anticoagulated as above  Code Status: Full  Family Communication: family at bedside  Disposition Plan:    Patient came from: Home  Anticipated d/c place: TBD  Barriers to d/c OR conditions which need to be met to effect a safe d/c: Not ready to discharge, needs vascular surgery clearance  Consultants:  Vascular surgery  Procedures:  As above  Antibiotics:  None    Objective: BP (!) 81/75 (BP Location: Other (Comment)) Comment (BP Location): A-Line  Pulse (!) 115   Temp 99.7 F (37.6 C)   Resp (!) 26   Ht '5\' 11"'  (1.803  m)   Wt 103.5 kg   SpO2 97%   BMI 31.82 kg/m   Intake/Output Summary (Last 24 hours) at 07/11/2019 1819 Last data filed at 07/11/2019 1700 Gross per 24 hour  Intake 1242.33 ml  Output 1305 ml  Net -62.67 ml   Filed Weights   07/07/19 0150 07/07/19 1612  Weight: 101.6 kg 103.5 kg    Exam: Patient is examined daily including today on 07/11/2019, exams remain the same as of yesterday except that has changed    General:  NAD  Cardiovascular: Slightly sinus tachycardia  Respiratory: CTABL  Abdomen: Soft/ND/NT, positive BS  Musculoskeletal: Right lower extremity edema  Neuro: alert, oriented   Data Reviewed: Basic Metabolic Panel: Recent Labs  Lab 07/08/19 0217 07/08/19 0217 07/09/19 0548 07/09/19 0548 07/09/19 1515 07/09/19 1742 07/09/19 2023 07/10/19 0457 07/11/19 0432  NA 135   < > 136   < > 140 141 138 140 137  K 3.9   < > 3.6   < > 3.3* 3.7 4.2 3.9 3.5  CL 97*  --  104  --   --   --  106 112* 108  CO2 25  --  21*  --   --   --  24 13* 23  GLUCOSE 251*  --  231*  --   --   --  302* 264* 201*  BUN 27*  --  13  --   --   --  '13 16 11  ' CREATININE 1.06*  --  0.75  --   --   --  0.79 0.91 0.72  CALCIUM 9.4  --  8.5*  --   --   --  6.9* 7.1* 7.7*  MG  --   --   --   --   --   --   --   --  1.8   < > = values in this interval not displayed.   Liver Function Tests: Recent Labs  Lab 07/07/19 0834  AST 16  ALT 28  ALKPHOS 122  BILITOT 1.1  PROT 7.5  ALBUMIN 4.1   No results for input(s): LIPASE, AMYLASE in the last 168 hours. No results for input(s): AMMONIA in the last 168 hours. CBC: Recent Labs  Lab 07/07/19 0834 07/08/19 0217 07/10/19 0737 07/10/19 1403 07/10/19 2224 07/11/19 0432 07/11/19 1641  WBC 10.2   < > 11.7* 9.9 11.4* 9.4 9.8  NEUTROABS 7.6  --   --   --   --   --   --   HGB 13.3   < > 9.4* 8.0* 7.7* 7.2* 9.6*  HCT 41.2   < > 27.8* 23.6* 22.7* 21.3* 29.2*  MCV 86.9   < > 87.4 86.4 88.3 89.5 86.4  PLT 163   < > 98* 91* 102* 99* 104*    < > = values in this interval not displayed.   Cardiac Enzymes:   No results for input(s): CKTOTAL, CKMB, CKMBINDEX, TROPONINI in the last 168 hours. BNP (last 3 results) No results for input(s): BNP in the last 8760 hours.  ProBNP (last 3 results) No results for input(s): PROBNP in the last 8760 hours.  CBG: Recent Labs  Lab 07/10/19 1536 07/10/19 2135 07/11/19 0623 07/11/19 1147 07/11/19 1645  GLUCAP 252* 188* 169* 202* 240*    Recent Results (from the past 240 hour(s))  SARS CORONAVIRUS 2 (TAT 6-24 HRS) Nasopharyngeal Nasopharyngeal Swab     Status: None   Collection Time: 07/07/19  3:33 PM   Specimen: Nasopharyngeal Swab  Result Value Ref Range Status   SARS Coronavirus 2 NEGATIVE NEGATIVE Final    Comment: (NOTE) SARS-CoV-2 target nucleic acids are NOT DETECTED. The SARS-CoV-2 RNA is generally detectable in upper and lower respiratory specimens during the acute phase of infection. Negative results do not preclude SARS-CoV-2 infection, do not rule out co-infections with other pathogens, and should not be used as the sole basis for treatment or other patient management decisions. Negative results must be combined with clinical observations, patient history, and epidemiological information. The expected result is Negative. Fact Sheet for Patients: SugarRoll.be Fact Sheet for Healthcare Providers: https://www.woods-mathews.com/ This test is not yet approved or cleared by the [place] FDA and  has been authorized for detection and/or diagnosis of SARS-CoV-2 by FDA under an Emergency Use Authorization (EUA). This EUA will remain  in effect (meaning this test can be used) for the duration of the COVID-19 declaration under Section 56 4(b)(1) of the Act, 21 U.S.C. section 360bbb-3(b)(1), unless the authorization is terminated or revoked sooner. Performed at [facility], [place] [address], [place], [place] [address]   MRSA  PCR Screening     Status: None   Collection Time: 07/08/19  5:40 PM   Specimen: Nasal Mucosa; Nasopharyngeal  Result Value Ref Range Status   MRSA by PCR NEGATIVE NEGATIVE Final    Comment:        The GeneXpert MRSA Assay (FDA approved for NASAL specimens only), is one component of a comprehensive MRSA colonization surveillance program. It is not intended to diagnose MRSA infection nor to guide or monitor treatment for MRSA infections. Performed at [facility], [place] [address], [place], [place] [address]      Studies: No results found.  Scheduled Meds: . Chlorhexidine Gluconate Cloth  6 each Topical Daily  . docusate sodium  100 mg Oral BID  . insulin aspart  0-15 Units Subcutaneous TID WC  . insulin aspart  0-5 Units Subcutaneous QHS  . insulin glargine  10 Units Subcutaneous BID  . living well with diabetes book   Does not apply Once  . montelukast  10 mg Oral QHS  . nitroGLYCERIN  1 inch Topical Q8H  . scopolamine  1 patch Transdermal Q72H  . simvastatin  40 mg Oral QHS  . sodium chloride flush  3 mL Intravenous Q12H  . sodium chloride flush  3 mL Intravenous Q12H    Continuous Infusions: . sodium chloride Stopped (07/10/19 1213)  . heparin 700 Units/hr (07/11/19 1700)  . phenylephrine (NEO-SYNEPHRINE) Adult infusion Stopped (07/10/19 0934)     Time spent: 48mns I have personally reviewed and interpreted on  07/11/2019 daily labs, tele strips, imagings as discussed above under date review session and assessment and plans.  I reviewed all nursing notes, pharmacy notes, consultant notes,  vitals, pertinent old records  I have discussed plan of care as described above with RN , patient and family on 07/11/2019   FFlorencia ReasonsMD, PhD, FACP  [facility]  Available via Epic secure chat 7am-7pm for nonurgent issues Please page for urgent issues, pager number available through [email or web address] .   07/11/2019, 6:19 PM  LOS: 4 days

## 2019-07-11 NOTE — Progress Notes (Addendum)
Progress Note    07/11/2019 7:06 AM 2 Days Post-Op  Subjective:  Aching all over. Nausea resolved. Tolerating diet  Np CP or SOB  Vitals:   07/11/19 0300 07/11/19 0400  BP:    Pulse: (!) 102 (!) 113  Resp: 15 (!) 23  Temp: 98.2 F (36.8 C) 98.4 F (36.9 C)  SpO2: 96% 96%    Physical Exam: Cardiac:  Elevated rate Lungs:  CTA bil extremities:  Thigh high compression stocking in place. AROM of feet with intact sensation. 2+ palp left DP pulse. Brisk Right DP Doppler signal Abdomen:  Mildly distended  CBC    Component Value Date/Time   WBC 11.4 (H) 07/10/2019 2224   RBC 2.57 (L) 07/10/2019 2224   HGB 7.7 (L) 07/10/2019 2224   HGB 14.1 03/13/2013 1028   HCT 22.7 (L) 07/10/2019 2224   HCT 43.5 03/13/2013 1028   PLT 102 (L) 07/10/2019 2224   PLT 225 03/13/2013 1028   MCV 88.3 07/10/2019 2224   MCV 87 03/13/2013 1028   MCH 30.0 07/10/2019 2224   MCHC 33.9 07/10/2019 2224   RDW 16.2 (H) 07/10/2019 2224   RDW 14.2 03/13/2013 1028   LYMPHSABS 1.8 07/07/2019 0834   LYMPHSABS 1.3 03/13/2013 1028   MONOABS 0.7 07/07/2019 0834   EOSABS 0.1 07/07/2019 0834   EOSABS 0.2 03/13/2013 1028   BASOSABS 0.1 07/07/2019 0834   BASOSABS 0.0 03/13/2013 1028    BMET    Component Value Date/Time   NA 137 07/11/2019 0432   K 3.5 07/11/2019 0432   CL 108 07/11/2019 0432   CO2 23 07/11/2019 0432   GLUCOSE 201 (H) 07/11/2019 0432   BUN 11 07/11/2019 0432   CREATININE 0.72 07/11/2019 0432   CALCIUM 7.7 (L) 07/11/2019 0432   GFRNONAA >60 07/11/2019 0432   GFRAA >60 07/11/2019 0432     Intake/Output Summary (Last 24 hours) at 07/11/2019 0706 Last data filed at 07/11/2019 0000 Gross per 24 hour  Intake 449.85 ml  Output 820 ml  Net -370.15 ml    HOSPITAL MEDICATIONS Scheduled Meds: . Chlorhexidine Gluconate Cloth  6 each Topical Daily  . docusate sodium  100 mg Oral BID  . insulin aspart  0-15 Units Subcutaneous TID WC  . insulin aspart  0-5 Units Subcutaneous QHS  .  insulin glargine  10 Units Subcutaneous BID  . living well with diabetes book   Does not apply Once  . montelukast  10 mg Oral QHS  . nitroGLYCERIN  1 inch Topical Q8H  . oxymetazoline  1 spray Each Nare BID  . scopolamine  1 patch Transdermal Q72H  . simvastatin  40 mg Oral QHS  . sodium chloride flush  3 mL Intravenous Q12H  . sodium chloride flush  3 mL Intravenous Q12H   Continuous Infusions: . sodium chloride Stopped (07/10/19 1213)  . heparin 500 Units/hr (07/11/19 0000)  . phenylephrine (NEO-SYNEPHRINE) Adult infusion Stopped (07/10/19 0934)   PRN Meds:.sodium chloride, acetaminophen **OR** acetaminophen, bisacodyl, cyclobenzaprine, hydrALAZINE, ketorolac, metoprolol tartrate, midazolam, ondansetron (ZOFRAN) IV, ondansetron (ZOFRAN) IV, oxyCODONE-acetaminophen, phenol, polyethylene glycol, sodium chloride flush, zolpidem  Assessment:69 y.o. female is s/p mechanical thrombectomy of IVC, illiac, and femoral veins bilaterally with IVC filter removal  2 Days Post-Op. CT evidence of retroperitoneal hematoma.  Plan: -Remains on hep infusion. Slight drift down in H and H. Continue to tren   Risa Grill, PA-C [facility] [phone] 07/11/2019  7:06 AM   I agree with the above.  I  have seen and evaluated the patient.  She is postoperative day #2 from IVC filter removal and venous thrombectomy.  She developed retroperitoneal bleeding likely from pain while out a venous branch.  Her heparin was temporarily discontinued and she was transfused 24 hours ago.  Her heparin is now back on at 500 units an hour.  She has had a steady decline in her hemoglobin.  She is not requiring pressors.  I plan on transfusing 2 more units of blood today.  I do not want to stop her heparin as I feel that puts her at risk for recurrent thrombosis.  Since I believe she is bleeding from a wire perforation, eventually this should tamponade.  Annamarie Major

## 2019-07-11 NOTE — Progress Notes (Signed)
[place] for Heparin per [facility] MD Mackey Birchwood asked to help monitor levels Indication: DVT  Patient Measurements: Height: 5\' 11"  (180.3 cm) Weight: 103.5 kg (228 lb 2.8 oz) IBW/kg (Calculated) : 70.8 Heparin Dosing Weight: 92.4 kg  Vital Signs: Temp: 99.7 F (37.6 C) (04/29 1700) Temp Source: Oral (04/29 0805) BP: 81/75 (04/29 0800) Pulse Rate: 115 (04/29 1700)  Labs: Recent Labs    07/09/19 2023 07/10/19 0102 07/10/19 0457 07/10/19 0737 07/10/19 1204 07/10/19 1403 07/10/19 2224 07/10/19 2224 07/11/19 0432 07/11/19 0915 07/11/19 1641  HGB 8.5*   < >  --    < >  --    < > 7.7*   < > 7.2*  --  9.6*  HCT 25.2*   < >  --    < >  --    < > 22.7*  --  21.3*  --  29.2*  PLT 119*   < >  --    < >  --    < > 102*  --  99*  --  104*  LABPROT  --   --   --   --   --   --   --   --   --  13.4  --   INR  --   --   --   --   --   --   --   --   --  1.1  --   HEPARINUNFRC 0.49  --   --   --  <0.10*  --   --   --  <0.10*  --  <0.10*  CREATININE 0.79  --  0.91  --   --   --   --   --  0.72  --   --    < > = values in this interval not displayed.    Estimated Creatinine Clearance: 90.4 mL/min (by C-G formula based on SCr of 0.72 mg/dL).  Assessment: 69 year old female s/p mechanical thrombectomy of IVC, iliac, and femoral viens bilaterally with IVC filter removal now 2 days post operative on IV heparin per Vascular Surgery. CT scan demonstrates retroperitoneal hematoma and patient received 2 units PRBCs today with Hgb up to 9.6. Pharmacy consulted to help monitor levels.   Heparin level this PM < 0.10 on 700 units/hr. Discussed level with Dr. Donnetta Hutching (on call for Vascular) and due to patient's current abdominal pain he has requested to leave the Heparin at 700 units/hr with NO increase at this time and then re-evaluate in AM.   Goal of Therapy:  Heparin level 0.2 to 0.3 -- MD dosing units/ml Monitor platelets by anticoagulation protocol: Yes   Plan:   Continue heparin drip at 700 units/hr as per Dr. Luther Parody request Follow-up daily heparin level in AM and re-evaluate with Vascular Surgery team.  *This plan was relayed to RN at bedside.   Sloan Leiter, PharmD, BCPS, BCCCP Clinical Pharmacist Please refer to [facility] for [place] numbers 07/11/2019 6:00 PM

## 2019-07-11 NOTE — Progress Notes (Addendum)
Inpatient Diabetes Program Recommendations  AACE/ADA: New Consensus Statement on Inpatient Glycemic Control (2015)  Target Ranges:  Prepandial:   less than 140 mg/dL      Peak postprandial:   less than 180 mg/dL (1-2 hours)      Critically ill patients:  140 - 180 mg/dL   Lab Results  Component Value Date   GLUCAP 202 (H) 07/11/2019   HGBA1C 11.2 (H) 07/07/2019    Review of Glycemic Control Results for Barbara Thomas, Barbara "JUDY" (MRN [id number]) as of 07/11/2019 12:45  Ref. Range 07/10/2019 06:47 07/10/2019 11:45 07/10/2019 15:36 07/10/2019 21:35 07/11/2019 06:23 07/11/2019 11:47  Glucose-Capillary Latest Ref Range: 70 - 99 mg/dL 241 (H) 264 (H) 252 (H) 188 (H) 169 (H) 202 (H)  Diabetes history: DM 2 NOTE: Patient admitted with venous thromboembolism. Noted consult for Diabetes Coordinator for new DM dx. Per chart, patient has never been told she had DM. A1C 6.9% on 11/27/17 and 7.2% on 06/21/18. Initial lab glucose on 07/07/19 was 427 mg/dl and current A1C 11.2% on 07/06/19. Outpatient Diabetes medications:  None Current orders for Inpatient glycemic control:  Novolog moderate tid with meals and HS Lantus 10 units daily Inpatient Diabetes Program Recommendations:  Please increase Lantus to 12 units daily and consider adding Novolog 3 units tid with meals (hold if patient eats less than 50% or NPO).    Thanks,  Adah Perl, RN, BC-ADM Inpatient Diabetes Coordinator Pager [phone] (8a-5p)  Addendum 14:30- Briefly spoke with patient regarding new diagnosis of DM. We discussed possible need for insulin.  Will need further instruction once feeling better and out of ICU.  Reminded her of importance of seeing PCP as well.

## 2019-07-11 NOTE — Progress Notes (Signed)
Patient has home CPAP machine and is able to place on herself without assistance.  RT instructed patient to have RT called if assistance is needed. RN is aware. RT will monitor as needed.

## 2019-07-12 LAB — BPAM RBC
Blood Product Expiration Date: 202105152359
Blood Product Expiration Date: 202105152359
Blood Product Expiration Date: 202105152359
Blood Product Expiration Date: 202105152359
Blood Product Expiration Date: 202106042359
Blood Product Expiration Date: 202106052359
ISSUE DATE / TIME: 202104271750
ISSUE DATE / TIME: 202104271750
ISSUE DATE / TIME: 202104272154
ISSUE DATE / TIME: 202104272154
ISSUE DATE / TIME: 202104290855
ISSUE DATE / TIME: 202104291359
Unit Type and Rh: 7300
Unit Type and Rh: 7300
Unit Type and Rh: 7300
Unit Type and Rh: 7300
Unit Type and Rh: 7300
Unit Type and Rh: 7300

## 2019-07-12 LAB — CBC
HCT: 31.1 % — ABNORMAL LOW (ref 36.0–46.0)
HCT: 30.5 % — ABNORMAL LOW (ref 36.0–46.0)
Hemoglobin: 10.8 g/dL — ABNORMAL LOW (ref 12.0–15.0)
Hemoglobin: 10.2 g/dL — ABNORMAL LOW (ref 12.0–15.0)
MCH: 28.7 pg (ref 26.0–34.0)
MCH: 29.1 pg (ref 26.0–34.0)
MCHC: 34.7 g/dL (ref 30.0–36.0)
MCHC: 33.4 g/dL (ref 30.0–36.0)
MCV: 82.7 fL (ref 80.0–100.0)
MCV: 87.1 fL (ref 80.0–100.0)
Platelets: 109 10*3/uL — ABNORMAL LOW (ref 150–400)
Platelets: 110 10*3/uL — ABNORMAL LOW (ref 150–400)
RBC: 3.76 MIL/uL — ABNORMAL LOW (ref 3.87–5.11)
RBC: 3.5 MIL/uL — ABNORMAL LOW (ref 3.87–5.11)
RDW: 16.8 % — ABNORMAL HIGH (ref 11.5–15.5)
RDW: 17 % — ABNORMAL HIGH (ref 11.5–15.5)
WBC: 9.3 10*3/uL (ref 4.0–10.5)
WBC: 7.4 10*3/uL (ref 4.0–10.5)
nRBC: 1.2 % — ABNORMAL HIGH (ref 0.0–0.2)
nRBC: 1.1 % — ABNORMAL HIGH (ref 0.0–0.2)

## 2019-07-12 LAB — BASIC METABOLIC PANEL
Anion gap: 13 (ref 5–15)
BUN: 10 mg/dL (ref 8–23)
CO2: 18 mmol/L — ABNORMAL LOW (ref 22–32)
Calcium: 8.1 mg/dL — ABNORMAL LOW (ref 8.9–10.3)
Chloride: 109 mmol/L (ref 98–111)
Creatinine, Ser: 0.63 mg/dL (ref 0.44–1.00)
GFR calc Af Amer: >60 mL/min (ref >60)
GFR calc non Af Amer: >60 mL/min (ref >60)
Glucose, Bld: 180 mg/dL — ABNORMAL HIGH (ref 70–99)
Potassium: 3.6 mmol/L (ref 3.5–5.1)
Sodium: 140 mmol/L (ref 135–145)

## 2019-07-12 LAB — TYPE AND SCREEN
ABO/RH(D): B POS
Antibody Screen: NEGATIVE
Unit division: 0
Unit division: 0
Unit division: 0
Unit division: 0
Unit division: 0
Unit division: 0

## 2019-07-12 LAB — GLUCOSE, CAPILLARY
Glucose-Capillary: 159 mg/dL — ABNORMAL HIGH (ref 70–99)
Glucose-Capillary: 242 mg/dL — ABNORMAL HIGH (ref 70–99)
Glucose-Capillary: 190 mg/dL — ABNORMAL HIGH (ref 70–99)

## 2019-07-12 LAB — HEPARIN LEVEL (UNFRACTIONATED)
Heparin Unfractionated: <0.1 IU/mL — ABNORMAL LOW (ref 0.30–0.70)
Heparin Unfractionated: <0.1 IU/mL — ABNORMAL LOW (ref 0.30–0.70)

## 2019-07-12 MED ORDER — BISACODYL 10 MG RE SUPP
10.0000 mg | Freq: Every day | RECTAL | Status: AC
  Filled 2019-07-12: qty 1

## 2019-07-12 MED ORDER — FUROSEMIDE 10 MG/ML IJ SOLN
20.0000 mg | Freq: Once | INTRAMUSCULAR | Status: AC
  Administered 2019-07-12: 20 mg via INTRAVENOUS
  Filled 2019-07-12: qty 2

## 2019-07-12 MED ORDER — INSULIN ASPART 100 UNIT/ML ~~LOC~~ SOLN
3.0000 [IU] | Freq: Three times a day (TID) | SUBCUTANEOUS | Status: DC
  Administered 2019-07-13 (×3): 3 [IU] via SUBCUTANEOUS

## 2019-07-12 MED ORDER — POLYETHYLENE GLYCOL 3350 17 G PO PACK
17.0000 g | PACK | Freq: Every day | ORAL | Status: DC
  Administered 2019-07-12 – 2019-07-14 (×3): 17 g via ORAL
  Filled 2019-07-12 (×3): qty 1

## 2019-07-12 NOTE — Progress Notes (Addendum)
[place] for Heparin per VVS MD Mackey Birchwood asked to help monitor levels Indication: DVT  Patient Measurements: Height: 5\' 11"  (180.3 cm) Weight: 117.3 kg (258 lb 9.6 oz) IBW/kg (Calculated) : 70.8 Heparin Dosing Weight: 92.4 kg  Vital Signs: Temp: 98.6 F (37 C) (04/30 0600) Temp Source: Oral (04/30 0000) BP: 146/60 (04/30 0600) Pulse Rate: 85 (04/30 0500)  Labs: Recent Labs    07/09/19 2023 07/10/19 0102 07/10/19 0457 07/10/19 0737 07/11/19 0432 07/11/19 0432 07/11/19 0915 07/11/19 1641 07/11/19 2136 07/12/19 0433  HGB 8.5*   < >  --    < > 7.2*   < >  --  9.6* 10.1*  --   HCT 25.2*   < >  --    < > 21.3*  --   --  29.2* 30.9*  --   PLT 119*   < >  --    < > 99*  --   --  104* 112*  --   LABPROT  --   --   --   --   --   --  13.4  --   --   --   INR  --   --   --   --   --   --  1.1  --   --   --   HEPARINUNFRC 0.49  --   --    < > <0.10*  --   --  <0.10*  --  <0.10*  CREATININE 0.79  --  0.91  --  0.72  --   --   --   --   --    < > = values in this interval not displayed.    Estimated Creatinine Clearance: 96.3 mL/min (by C-G formula based on SCr of 0.72 mg/dL).  Assessment: 69 year old female s/p mechanical thrombectomy of IVC, iliac, and femoral viens bilaterally with IVC filter removal now 2 days post operative on IV heparin per Vascular Surgery. CT scan demonstrates retroperitoneal hematoma and patient received 2 units PRBCs today with Hgb up to 9.6. Pharmacy consulted to help monitor levels.   Heparin level this AM < 0.10 on 700 units/hr. Discussed level with Dr. Trula Slade, patient is improving from a bleeding concern and he has requested to increase the Heparin to 900 units/hr. Increase with close monitoring. Hbg is 10.1 (s/p 2 units of PRBC yesterday), plts 112 (low but stable). RN reports no line issue.   Goal of Therapy:  Heparin level 0.2 to 0.3 -- MD dosing units/ml Monitor platelets by anticoagulation protocol: Yes    Plan:  Increase heparin drip to 900 units/hr  Follow-up heparin level and CBC in 8hrs and re-evaluate with Vascular Surgery team.  Follow daily heparin, CBC and s/sx of bleeding  *This plan was relayed to RN at bedside.   Acey Lav, PharmD  PGY1 Acute Care Pharmacy Resident 07/12/2019 6:53 AM

## 2019-07-12 NOTE — Progress Notes (Signed)
    Subjective  - POD #3,4  Walked this morning. Still complaining of abdominal pain   Physical Exam:  Compression stockings in place Generalized anasarca Nonlabored breathing       Assessment/Plan:  POD #3,4  Acute blood loss anemia: The patient received an additional 2 units of blood yesterday.  Her hemoglobin had an appropriate response and actually increased this morning.  I am optimistic that she has stopped bleeding.  I will check a CBC this evening and in the morning.  Anticoagulation: I am going to increase her heparin from 700-900 and keep it steady today.  If her hemoglobin does not change, we can go to therapeutic levels tomorrow with plans to transition to a DOAC prior to discharge  Diabetes: Appreciate medical team assistance.  The patient's hemoglobin A1c is significantly elevated.  Diabetes coordinator was involved.  Anasarca: The patient is significantly volume overloaded.  I will give her a dose of Lasix today and then remove her Foley catheter in place a wick  Transfer to 4 E.  Physical therapy consult  Annamarie Major 07/12/2019 7:37 AM --  Vitals:   07/12/19 0500 07/12/19 0600  BP: (!) 132/56 (!) 146/60  Pulse: 85   Resp: 17 (!) 31  Temp: 97.7 F (36.5 C) 98.6 F (37 C)  SpO2: 96%     Intake/Output Summary (Last 24 hours) at 07/12/2019 0737 Last data filed at 07/12/2019 0600 Gross per 24 hour  Intake 1303.27 ml  Output 1740 ml  Net -436.73 ml     Laboratory CBC    Component Value Date/Time   WBC 9.8 07/11/2019 2136   HGB 10.1 (L) 07/11/2019 2136   HGB 14.1 03/13/2013 1028   HCT 30.9 (L) 07/11/2019 2136   HCT 43.5 03/13/2013 1028   PLT 112 (L) 07/11/2019 2136   PLT 225 03/13/2013 1028    BMET    Component Value Date/Time   NA 137 07/11/2019 0432   K 3.5 07/11/2019 0432   CL 108 07/11/2019 0432   CO2 23 07/11/2019 0432   GLUCOSE 201 (H) 07/11/2019 0432   BUN 11 07/11/2019 0432   CREATININE 0.72 07/11/2019 0432   CALCIUM 7.7  (L) 07/11/2019 0432   GFRNONAA >60 07/11/2019 0432   GFRAA >60 07/11/2019 0432    COAG Lab Results  Component Value Date   INR 1.1 07/11/2019   INR 1.1 07/08/2019   INR 1.1 07/07/2019   No results found for: PTT  Antibiotics Anti-infectives (From admission, onward)   Start     Dose/Rate Route Frequency Ordered Stop   07/09/19 1145  ceFAZolin (ANCEF) IVPB 2g/100 mL premix     2 g 200 mL/hr over 30 Minutes Intravenous  Once 07/09/19 1139 07/09/19 1255   07/09/19 1143  ceFAZolin (ANCEF) 2-4 GM/100ML-% IVPB    Note to Pharmacy: Tamsen Snider   : cabinet override      07/09/19 1143 07/09/19 1302   07/07/19 0815  sulfamethoxazole-trimethoprim (BACTRIM DS) 800-160 MG per tablet 1 tablet  Status:  Discontinued     1 tablet Oral  Once 07/07/19 0804 07/07/19 0804       V. Leia Alf, M.D., [facility] [place] Office: [phone] Pager:  [phone]

## 2019-07-12 NOTE — Progress Notes (Signed)
Patient has home unit for use and will self place when ready. RT will continue to monitor

## 2019-07-12 NOTE — Plan of Care (Signed)
  RD consulted for nutrition education regarding diabetes.   Lab Results  Component Value Date   HGBA1C 11.2 (H) 07/07/2019   Reviewed pt's dietary recall and discussed methods for better blood sugar control.   RD provided "Carbohydrate Counting for People with Diabetes" handout from the Academy of Nutrition and Dietetics. Discussed different food groups and their effects on blood sugar, emphasizing carbohydrate-containing foods. Provided list of carbohydrates and recommended serving sizes of common foods.  Discussed importance of controlled and consistent carbohydrate intake throughout the day. Provided examples of ways to balance meals/snacks and encouraged intake of high-fiber, whole grain complex carbohydrates. Teach back method used.  Expect good compliance.  Body mass index is 36.07 kg/m. Pt meets criteria for obesity based on current BMI.  Current diet order is Heart Healthy, patient is consuming approximately 25-50% of meals at this time, but states her appetite is improving and that she is drinking Glucerna shakes TID. Labs and medications reviewed. No further nutrition interventions warranted at this time. RD contact information provided. If additional nutrition issues arise, please re-consult RD.  Larkin Ina, MS, RD, LDN RD pager number and weekend/on-call pager number located in [place].

## 2019-07-12 NOTE — Progress Notes (Signed)
[place] for Heparin per VVS MD/Pharmacy peripherally monitoring heparin levels Indication: DVT  Patient Measurements: Height: 5\' 11"  (180.3 cm) Weight: 117.3 kg (258 lb 9.6 oz) IBW/kg (Calculated) : 70.8 Heparin Dosing Weight: 92.4 kg  Vital Signs: Temp: 98.4 F (36.9 C) (04/30 1700) Temp Source: Oral (04/30 1700) BP: 144/67 (04/30 1700) Pulse Rate: 90 (04/30 1700)  Labs: Recent Labs    07/10/19 0457 07/10/19 0737 07/11/19 0432 07/11/19 0432 07/11/19 0915 07/11/19 1641 07/11/19 1641 07/11/19 2136 07/11/19 2136 07/12/19 0433 07/12/19 0738 07/12/19 1829  HGB  --    < > 7.2*   < >  --  9.6*   < > 10.1*   < >  --  10.8* 10.2*  HCT  --    < > 21.3*   < >  --  29.2*   < > 30.9*  --   --  31.1* 30.5*  PLT  --    < > 99*   < >  --  104*   < > 112*  --   --  109* 110*  LABPROT  --   --   --   --  13.4  --   --   --   --   --   --   --   INR  --   --   --   --  1.1  --   --   --   --   --   --   --   HEPARINUNFRC  --    < > <0.10*   < >  --  <0.10*  --   --   --  <0.10*  --  <0.10*  CREATININE 0.91  --  0.72  --   --   --   --   --   --  0.63  --   --    < > = values in this interval not displayed.    Estimated Creatinine Clearance: 96.3 mL/min (by C-G formula based on SCr of 0.63 mg/dL).  Assessment: 69 year old female s/p mechanical thrombectomy of IVC, iliac, and femoral viens bilaterally with IVC filter removal now 2 days post operative on IV heparin per Vascular Surgery. CT scan demonstrates retroperitoneal hematoma and patient received 2 units PRBCs today with improvement in Hg. Pharmacy consulted to help monitor heparin levels.   Heparin level remains undetectable tonight. Per previous PharmD discussion with Dr. Trula Slade today, no adjustment in heparin rate for low level at this time. Pharmacy only to adjust if high. Vascular Surgery will follow-up for adjustments in the AM. Hg improved to 10.2 (s/p 2 units of PRBC yesterday), plts  110 (low but stable). No active bleed issues reported.  Goal of Therapy:  Heparin level 0.2 to 0.3 -- MD dosing units/ml Monitor platelets by anticoagulation protocol: Yes   Plan:  Continue heparin drip at 900 units/hr per Vascular Surgery request Monitor daily heparin level and CBC, s/sx bleeding and re-evaluate with Vascular Surgery team on 5/1 AM   Arturo Morton, PharmD, BCPS Please check AMION for all [place] contact numbers Clinical Pharmacist 07/12/2019 7:21 PM

## 2019-07-12 NOTE — Evaluation (Signed)
Physical Therapy Evaluation Patient Details Name: Barbara Thomas MRN: [id number] DOB: [birth year] Today's Date: 07/12/2019   History of Present Illness  69 y.o. F admitted on 07/07/2019 with acute IVC filter occlusion, BLE DVT. Pt s/p mechanical thrombectomy of IVC, illiac, and femoral veins bilaterally with IVC filter removal performed  07/08/2019. PMHx OSA on CPAP; HLD; DM, h/o DVT/PE, cancer, arthritis  Clinical Impression  Pt presents with decreased cardiac endurance, increased pain, and decreased functional mobility secondary to above. Pt required Max(A) +2 sit to stand due to increased pain. Pt able to amb 188 ft Min(A) RW chair follow with 3 rest breaks. Pt was educated on importance of mobility, proper use of DME and exercise program to complete while in room. Next level of care at rehab facility discussed with patient and daughter, Pt reports she would prefer to return home, but open to idea. Pt remains appropriate for physical therapy in acute care setting to improve cardiac endurance and safe functional mobility until d/c to next level of care.    Follow Up Recommendations CIR    Equipment Recommendations  Other (comment)(Pt reports she has all DME she would need at home)    Recommendations for Other Services       Precautions / Restrictions Precautions Precautions: None Restrictions Weight Bearing Restrictions: No      Mobility  Bed Mobility               General bed mobility comments: not observed  Transfers Overall transfer level: Needs assistance Equipment used: Rolling walker (2 wheeled) Transfers: Sit to/from Stand Sit to Stand: +2 physical assistance;Max assist         General transfer comment: Pt required max assist for saftey due to Pt increased pain level, stiffness  Ambulation/Gait Ambulation/Gait assistance: Min guard;+2 safety/equipment(chair follow) Gait Distance (Feet): 188 Feet Assistive device: Rolling walker (2 wheeled) Gait  Pattern/deviations: Step-through pattern;Wide base of support     General Gait Details: HR remained in 140's during amb, Pt demonstrated increased pain during amb correlating with increased HR, Pt demonstrated no other signs of cardiac distress during amb. Offered use of RLE orthoshoe for amb, Pt refused stating she did not use it to amb earlier.  Stairs            Wheelchair Mobility    Modified Rankin (Stroke Patients Only)       Balance Overall balance assessment: Needs assistance Sitting-balance support: Bilateral upper extremity supported Sitting balance-Leahy Scale: Fair     Standing balance support: Bilateral upper extremity supported Standing balance-Leahy Scale: Poor Standing balance comment: Pt required BUE support with RW for stand,amb                             Pertinent Vitals/Pain Pain Assessment: 0-10 Pain Score: 8  Pain Location: abdomen, LLE Pain Descriptors / Indicators: Discomfort;Grimacing;Guarding Pain Intervention(s): Limited activity within patient's tolerance;Monitored during session;RN gave pain meds during session;Repositioned    Home Living Family/patient expects to be discharged to:: Private residence Living Arrangements: Spouse/significant other Available Help at Discharge: Family;Available PRN/intermittently Type of Home: House Home Access: Stairs to enter   Entrance Stairs-Number of Steps: 2 Home Layout: One level Home Equipment: Walker - 2 wheels;Bedside commode;Other (comment);Cane - single point;Crutches(drop arm BSC)      Prior Function Level of Independence: Independent               Hand Dominance  Extremity/Trunk Assessment   Upper Extremity Assessment Upper Extremity Assessment: Overall WFL for tasks assessed    Lower Extremity Assessment Lower Extremity Assessment: Overall WFL for tasks assessed;RLE deficits/detail RLE: Unable to fully assess due to pain       Communication    Communication: No difficulties  Cognition Arousal/Alertness: Awake/alert Behavior During Therapy: WFL for tasks assessed/performed Overall Cognitive Status: Within Functional Limits for tasks assessed                                        General Comments General comments (skin integrity, edema, etc.): Pt daughter in room during session. Daughter lives in [place]. Brusing on BUE. Pt was educated on importance of LAQ, AP, seated hip flexion exercises to complete in room.    Exercises     Assessment/Plan    PT Assessment Patient needs continued PT services  PT Problem List Decreased mobility;Decreased range of motion;Decreased activity tolerance;Cardiopulmonary status limiting activity;Decreased balance;Decreased knowledge of use of DME;Pain       PT Treatment Interventions DME instruction;Therapeutic activities;Gait training;Therapeutic exercise;Patient/family education;Balance training;Functional mobility training    PT Goals (Current goals can be found in the Care Plan section)  Acute Rehab PT Goals Patient Stated Goal: Return home PT Goal Formulation: With patient/family Time For Goal Achievement: 07/26/19 Potential to Achieve Goals: Good    Frequency Min 3X/week   Barriers to discharge Decreased caregiver support pt reports she lives with her husband, who works and cannot take off time to care for her. Daughter lives in [place].    Co-evaluation               AM-PAC PT "6 Clicks" Mobility  Outcome Measure Help needed turning from your back to your side while in a flat bed without using bedrails?: A Little Help needed moving from lying on your back to sitting on the side of a flat bed without using bedrails?: A Little Help needed moving to and from a bed to a chair (including a wheelchair)?: A Little Help needed standing up from a chair using your arms (e.g., wheelchair or bedside chair)?: A Lot Help needed to walk in hospital room?: A Little Help  needed climbing 3-5 steps with a railing? : A Lot 6 Click Score: 16    End of Session Equipment Utilized During Treatment: Gait belt Activity Tolerance: Patient tolerated treatment well Patient left: in chair;with family/visitor present;Other (comment)(With nurse pushing her in wheelchair to new room) Nurse Communication: Mobility status;Patient requests pain meds PT Visit Diagnosis: Unsteadiness on feet (R26.81);Difficulty in walking, not elsewhere classified (R26.2);Other abnormalities of gait and mobility (R26.89);Pain Pain - Right/Left: Left Pain - part of body: Leg(abdomen)    Time: [id number] PT Time Calculation (min) (ACUTE ONLY): 22 min   Charges:   PT Evaluation $PT Eval Moderate Complexity: 1 Mod          Ephriam Turman SPT 07/12/2019   Rolland Porter 07/12/2019, 1:33 PM

## 2019-07-12 NOTE — Progress Notes (Signed)
[facility]  Subjective  - Sitting up and walked in halls this am.  Abdomin with pain left upper and lower quadrant.   Objective (!) 146/60 85 98.6 F (37 C) (!) 31 96%  Intake/Output Summary (Last 24 hours) at 07/12/2019 0732 Last data filed at 07/12/2019 0600 Gross per 24 hour  Intake 1303.27 ml  Output 1740 ml  Net -436.73 ml   Palpable left DP/PT, Right brisk DP signal Compression thigh in place Lungs non labored breathing  Assessment/Planning: POD #3 69 y.o.femaleis s/p mechanical thrombectomy of IVC, illiac, and femoral veins bilaterally with IVC filter removal 2 Days Post-Op. CT evidence of retroperitoneal hematoma.  Will wait for CBC this am.  She may need repeat CTA.  I will defer this to Dr. Trula Slade Continue Heparin for now. Will take out red robbins today   Roxy Horseman 07/12/2019 7:32 AM --  Laboratory Lab Results: Recent Labs    07/11/19 1641 07/11/19 2136  WBC 9.8 9.8  HGB 9.6* 10.1*  HCT 29.2* 30.9*  PLT 104* 112*   BMET Recent Labs    07/10/19 0457 07/11/19 0432  NA 140 137  K 3.9 3.5  CL 112* 108  CO2 13* 23  GLUCOSE 264* 201*  BUN 16 11  CREATININE 0.91 0.72  CALCIUM 7.1* 7.7*    COAG Lab Results  Component Value Date   INR 1.1 07/11/2019   INR 1.1 07/08/2019   INR 1.1 07/07/2019   No results found for: PTT

## 2019-07-12 NOTE — Progress Notes (Signed)
PROGRESS NOTE  Barbara Thomas [id number] DOB: March 31, 1950 DOA: 07/07/2019 PCP: Ann Held, DO  HPI/Recap of past 24 hours:  She is moved out of icu, bp/heart rate has improved Blood glucose better controlled Still no bm ( no bm for a week), poor appetite, denies ab pain, no n/v, no fever Family at bedside  Assessment/Plan: Principal Problem:   Venous thromboembolism confirmed by diagnostic testing Active Problems:   Obesity (BMI 30-39.9)   OSA (obstructive sleep apnea)   Hyperlipidemia LDL goal <100   Diabetes mellitus type 2 in obese (HCC)   Nausea   Acute respiratory failure with hypoxia (HCC)  Acute IVC filter occlusion with bilateral lower extremity DVT S/p Placement of bilateral lower extremity lytic catheter 50 cm treatment length from the filter to the mid femoral vein Now postoperative day 2 from IVC filter removal and venous thrombectomy Retroperitoneal bleeding/required pressor support and PRBC transfusion Currently on heparin drip Plan per vascular surgery  Acute blood loss anemia -Status post PRBC transfusion -management per vascular surgery  Diabetes, patient report was never treated previously -A1c 11.2 -started on insulin since admission, continue adjust insulin -Diabetes and diet education Will need to discharge on insulin and metformin Checking lipid panel  Severe constipation, started on sennokot bid/ miralax bid, add on suppository    Obesity/OSA on nightly CPAP at home Body mass index is 31.82 kg/m.    DVT Prophylaxis: Anticoagulated as above  Code Status: Full  Family Communication: family at bedside daily  Disposition Plan:    Patient came from: Home  Anticipated d/c place: CIR  Barriers to d/c OR conditions which need to be met to effect a safe d/c: Not ready to discharge, needs vascular surgery  clearance  Consultants:  Vascular surgery  Procedures:  As above  Antibiotics:  None    Objective: BP 138/60 (BP Location: Left Arm)   Pulse 90   Temp 98 F (36.7 C) (Oral)   Resp (!) 22   Ht '5\' 11"'  (1.803 m)   Wt 117.3 kg   SpO2 95%   BMI 36.07 kg/m   Intake/Output Summary (Last 24 hours) at 07/12/2019 1734 Last data filed at 07/12/2019 1400 Gross per 24 hour  Intake 362.12 ml  Output 2425 ml  Net -2062.88 ml   Filed Weights   07/07/19 0150 07/07/19 1612 07/12/19 0600  Weight: 101.6 kg 103.5 kg 117.3 kg    Exam: Patient is examined daily including today on 07/12/2019, exams remain the same as of yesterday except that has changed    General:  NAD  Cardiovascular: Slightly sinus tachycardia , improved   Respiratory: CTABL  Abdomen: Soft/ND/NT, positive BS  Musculoskeletal: Right lower extremity edema  Neuro: alert, oriented   Data Reviewed: Basic Metabolic Panel: Recent Labs  Lab 07/09/19 0548 07/09/19 1515 07/09/19 1742 07/09/19 2023 07/10/19 0457 07/11/19 0432 07/12/19 0433  NA 136   < > 141 138 140 137 140  K 3.6   < > 3.7 4.2 3.9 3.5 3.6  CL 104  --   --  106 112* 108 109  CO2 21*  --   --  24 13* 23 18*  GLUCOSE 231*  --   --  302* 264* 201* 180*  BUN 13  --   --  '13 16 11 10  ' CREATININE 0.75  --   --  0.79 0.91 0.72 0.63  CALCIUM 8.5*  --   --  6.9* 7.1* 7.7* 8.1*  MG  --   --   --   --   --  1.8  --    < > = values in this interval not displayed.   Liver Function Tests: Recent Labs  Lab 07/07/19 0834  AST 16  ALT 28  ALKPHOS 122  BILITOT 1.1  PROT 7.5  ALBUMIN 4.1   No results for input(s): LIPASE, AMYLASE in the last 168 hours. No results for input(s): AMMONIA in the last 168 hours. CBC: Recent Labs  Lab 07/07/19 0834 07/08/19 0217 07/10/19 2224 07/11/19 0432 07/11/19 1641 07/11/19 2136 07/12/19 0738  WBC 10.2   < > 11.4* 9.4 9.8 9.8 9.3  NEUTROABS 7.6  --   --   --   --   --   --   HGB 13.3   < > 7.7* 7.2*  9.6* 10.1* 10.8*  HCT 41.2   < > 22.7* 21.3* 29.2* 30.9* 31.1*  MCV 86.9   < > 88.3 89.5 86.4 87.0 82.7  PLT 163   < > 102* 99* 104* 112* 109*   < > = values in this interval not displayed.   Cardiac Enzymes:   No results for input(s): CKTOTAL, CKMB, CKMBINDEX, TROPONINI in the last 168 hours. BNP (last 3 results) No results for input(s): BNP in the last 8760 hours.  ProBNP (last 3 results) No results for input(s): PROBNP in the last 8760 hours.  CBG: Recent Labs  Lab 07/11/19 1645 07/11/19 2136 07/12/19 0559 07/12/19 1232 07/12/19 1634  GLUCAP 240* 198* 159* 242* 190*    Recent Results (from the past 240 hour(s))  SARS CORONAVIRUS 2 (TAT 6-24 HRS) Nasopharyngeal Nasopharyngeal Swab     Status: None   Collection Time: 07/07/19  3:33 PM   Specimen: Nasopharyngeal Swab  Result Value Ref Range Status   SARS Coronavirus 2 NEGATIVE NEGATIVE Final    Comment: (NOTE) SARS-CoV-2 target nucleic acids are NOT DETECTED. The SARS-CoV-2 RNA is generally detectable in upper and lower respiratory specimens during the acute phase of infection. Negative results do not preclude SARS-CoV-2 infection, do not rule out co-infections with other pathogens, and should not be used as the sole basis for treatment or other patient management decisions. Negative results must be combined with clinical observations, patient history, and epidemiological information. The expected result is Negative. Fact Sheet for Patients: SugarRoll.be Fact Sheet for Healthcare Providers: https://www.woods-mathews.com/ This test is not yet approved or cleared by the [place] FDA and  has been authorized for detection and/or diagnosis of SARS-CoV-2 by FDA under an Emergency Use Authorization (EUA). This EUA will remain  in effect (meaning this test can be used) for the duration of the COVID-19 declaration under Section 56 4(b)(1) of the Act, 21 U.S.C. section  360bbb-3(b)(1), unless the authorization is terminated or revoked sooner. Performed at [facility], [place] [address] [place], [place] [address]   MRSA PCR Screening     Status: None   Collection Time: 07/08/19  5:40 PM   Specimen: Nasal Mucosa; Nasopharyngeal  Result Value Ref Range Status   MRSA by PCR NEGATIVE NEGATIVE Final    Comment:        The GeneXpert MRSA Assay (FDA approved for NASAL specimens only), is one component of a comprehensive MRSA colonization surveillance program. It is not intended to diagnose MRSA infection nor to guide or monitor treatment for MRSA infections. Performed at [facility], [place] [address], [place], [place] [address]      Studies: No results found.  Scheduled Meds: . Chlorhexidine Gluconate Cloth  6 each Topical Daily  .  feeding supplement (GLUCERNA SHAKE)  237 mL Oral TID BM  . insulin aspart  0-15 Units Subcutaneous TID WC  . insulin aspart  0-5 Units Subcutaneous QHS  . [START ON 07/13/2019] insulin aspart  3 Units Subcutaneous TID WC  . insulin glargine  10 Units Subcutaneous BID  . living well with diabetes book   Does not apply Once  . montelukast  10 mg Oral QHS  . polyethylene glycol  17 g Oral Daily  . scopolamine  1 patch Transdermal Q72H  . senna-docusate  1 tablet Oral BID  . simvastatin  40 mg Oral QHS  . sodium chloride flush  3 mL Intravenous Q12H  . sodium chloride flush  3 mL Intravenous Q12H    Continuous Infusions: . sodium chloride Stopped (07/10/19 1213)  . heparin 900 Units/hr (07/12/19 1727)  . phenylephrine (NEO-SYNEPHRINE) Adult infusion Stopped (07/10/19 0934)     Time spent: 36mns I have personally reviewed and interpreted on  07/12/2019 daily labs, tele strips, imagings as discussed above under date review session and assessment and plans.  I reviewed all nursing notes, pharmacy notes, consultant notes,  vitals, pertinent old records  I have discussed plan of care as described above with  RN , patient and family on 07/12/2019   FFlorencia ReasonsMD, PhD, FACP  [facility]  Available via Epic secure chat 7am-7pm for nonurgent issues Please page for urgent issues, pager number available through [email or web address] .   07/12/2019, 5:34 PM  LOS: 5 days

## 2019-07-12 NOTE — Anesthesia Postprocedure Evaluation (Signed)
Anesthesia Post Note  Patient: Barbara Thomas  Procedure(s) Performed: Bilateral ultrasound guided Cannulation of Internal Jugular,, Venogram External iliac,  common Femoral arteries, Angioplasty of Bilateral Common femoral and iliacs, ANGIOVAC WITH CIRC ARREST x 21 minutes, VENOUS THROMBECTOMY OF IVC, Iliacs and Femoral Vein WITH IVUS with Removal of IVC Filter (Bilateral Neck)     Patient location during evaluation: PACU Anesthesia Type: General Level of consciousness: patient cooperative and awake Pain management: pain level controlled Vital Signs Assessment: post-procedure vital signs reviewed and stable Respiratory status: spontaneous breathing, nonlabored ventilation, respiratory function stable and patient connected to nasal cannula oxygen Cardiovascular status: blood pressure returned to baseline and stable Postop Assessment: no apparent nausea or vomiting Anesthetic complications: no    Last Vitals:  Vitals:   07/12/19 0700 07/12/19 0800  BP:    Pulse:    Resp: 18 20  Temp: 37 C 36.9 C  SpO2:  94%    Last Pain:  Vitals:   07/12/19 0800  TempSrc:   PainSc: 0-No pain                 Madason Rauls

## 2019-07-12 NOTE — Progress Notes (Signed)
  Pt arrived to 4e from 2h. Pt oriented to room and staff. Vitals obtained and stable. PRN pain medication given, per pt request. Pt resting in bed with call light within reach. Daughter at bedside.

## 2019-07-12 NOTE — Progress Notes (Signed)
Rehab Admissions Coordinator Note:  Patient was screened by Cleatrice Burke for appropriateness for an Inpatient Acute Rehab Consult per PT recs.   At this time, we are recommending Inpatient Rehab consult. I will place order per protocol.  Cleatrice Burke RN MSN 07/12/2019, 1:43 PM  I can be reached at [phone].

## 2019-07-13 ENCOUNTER — Inpatient Hospital Stay (HOSPITAL_COMMUNITY): Payer: Medicare Other

## 2019-07-13 LAB — GLUCOSE, CAPILLARY
Glucose-Capillary: 185 mg/dL — ABNORMAL HIGH (ref 70–99)
Glucose-Capillary: 145 mg/dL — ABNORMAL HIGH (ref 70–99)
Glucose-Capillary: 162 mg/dL — ABNORMAL HIGH (ref 70–99)
Glucose-Capillary: 197 mg/dL — ABNORMAL HIGH (ref 70–99)
Glucose-Capillary: 147 mg/dL — ABNORMAL HIGH (ref 70–99)

## 2019-07-13 LAB — CBC
HCT: 29.2 % — ABNORMAL LOW (ref 36.0–46.0)
HCT: 28.1 % — ABNORMAL LOW (ref 36.0–46.0)
Hemoglobin: 9.6 g/dL — ABNORMAL LOW (ref 12.0–15.0)
Hemoglobin: 9.3 g/dL — ABNORMAL LOW (ref 12.0–15.0)
MCH: 28.4 pg (ref 26.0–34.0)
MCH: 28.6 pg (ref 26.0–34.0)
MCHC: 32.9 g/dL (ref 30.0–36.0)
MCHC: 33.1 g/dL (ref 30.0–36.0)
MCV: 86.4 fL (ref 80.0–100.0)
MCV: 86.5 fL (ref 80.0–100.0)
Platelets: 104 10*3/uL — ABNORMAL LOW (ref 150–400)
Platelets: 121 10*3/uL — ABNORMAL LOW (ref 150–400)
RBC: 3.38 MIL/uL — ABNORMAL LOW (ref 3.87–5.11)
RBC: 3.25 MIL/uL — ABNORMAL LOW (ref 3.87–5.11)
RDW: 16.7 % — ABNORMAL HIGH (ref 11.5–15.5)
RDW: 17 % — ABNORMAL HIGH (ref 11.5–15.5)
WBC: 9.8 10*3/uL (ref 4.0–10.5)
WBC: 6.8 10*3/uL (ref 4.0–10.5)
nRBC: 1.4 % — ABNORMAL HIGH (ref 0.0–0.2)
nRBC: 0.4 % — ABNORMAL HIGH (ref 0.0–0.2)

## 2019-07-13 LAB — BASIC METABOLIC PANEL
Anion gap: 8 (ref 5–15)
BUN: 7 mg/dL — ABNORMAL LOW (ref 8–23)
CO2: 26 mmol/L (ref 22–32)
Calcium: 8.3 mg/dL — ABNORMAL LOW (ref 8.9–10.3)
Chloride: 105 mmol/L (ref 98–111)
Creatinine, Ser: 0.67 mg/dL (ref 0.44–1.00)
GFR calc Af Amer: >60 mL/min (ref >60)
GFR calc non Af Amer: >60 mL/min (ref >60)
Glucose, Bld: 179 mg/dL — ABNORMAL HIGH (ref 70–99)
Potassium: 3.2 mmol/L — ABNORMAL LOW (ref 3.5–5.1)
Sodium: 139 mmol/L (ref 135–145)

## 2019-07-13 LAB — MAGNESIUM: Magnesium: 1.9 mg/dL (ref 1.7–2.4)

## 2019-07-13 LAB — LIPID PANEL
Cholesterol: 131 mg/dL (ref 0–200)
HDL: 36 mg/dL — ABNORMAL LOW (ref >40)
LDL Cholesterol: 63 mg/dL (ref 0–99)
Total CHOL/HDL Ratio: 3.6 RATIO
Triglycerides: 160 mg/dL — ABNORMAL HIGH (ref <150)
VLDL: 32 mg/dL (ref 0–40)

## 2019-07-13 LAB — HEPARIN LEVEL (UNFRACTIONATED)
Heparin Unfractionated: <0.1 IU/mL — ABNORMAL LOW (ref 0.30–0.70)
Heparin Unfractionated: 0.52 IU/mL (ref 0.30–0.70)
Heparin Unfractionated: 0.51 IU/mL (ref 0.30–0.70)

## 2019-07-13 MED ORDER — FUROSEMIDE 10 MG/ML IJ SOLN
20.0000 mg | Freq: Once | INTRAMUSCULAR | Status: AC
  Administered 2019-07-13: 20 mg via INTRAVENOUS
  Filled 2019-07-13: qty 2

## 2019-07-13 MED ORDER — ATORVASTATIN CALCIUM 40 MG PO TABS: 40.0000 mg | ORAL_TABLET | Freq: Every day | ORAL | Status: DC

## 2019-07-13 MED ORDER — POTASSIUM CHLORIDE CRYS ER 20 MEQ PO TBCR
40.0000 meq | EXTENDED_RELEASE_TABLET | ORAL | Status: AC
  Administered 2019-07-13 (×2): 40 meq via ORAL
  Filled 2019-07-13 (×2): qty 2

## 2019-07-13 MED ORDER — SIMVASTATIN 20 MG PO TABS
40.0000 mg | ORAL_TABLET | Freq: Every day | ORAL | Status: DC
  Administered 2019-07-13 – 2019-07-15 (×3): 40 mg via ORAL
  Filled 2019-07-13 (×3): qty 2

## 2019-07-13 NOTE — Progress Notes (Signed)
[facility]  Subjective  -states was having abdominal pain overnight.  Very minimal mobility.   Objective (!) 143/64 93 (!) 97.5 F (36.4 C) (Oral) 19 98%  Intake/Output Summary (Last 24 hours) at 07/13/2019 1026 Last data filed at 07/13/2019 0952 Gross per 24 hour  Intake 240 ml  Output 3145 ml  Net -2905 ml    Bilateral lower extremities with compression socks. Abdomen is soft with no rebound or guarding  Laboratory Lab Results: Recent Labs    07/12/19 1829 07/13/19 0246  WBC 7.4 6.8  HGB 10.2* 9.3*  HCT 30.5* 28.1*  PLT 110* 121*   BMET Recent Labs    07/12/19 0433 07/13/19 0246  NA 140 139  K 3.6 3.2*  CL 109 105  CO2 18* 26  GLUCOSE 180* 179*  BUN 10 7*  CREATININE 0.63 0.67  CALCIUM 8.1* 8.3*    COAG Lab Results  Component Value Date   INR 1.1 07/11/2019   INR 1.1 07/08/2019   INR 1.1 07/07/2019   No results found for: PTT  Assessment/Planning:  69 year old female now status post bilateral lower extremity thrombolysis through popliteal vein access and subsequent angio vac with retrieval of thrombus from her vena cava and removal of old vena cava filter.  We will give another dose of Lasix today for lower extremity edema.  Hemoglobin today from 10.2 to 9.3 and only dropped 0.9 units.  Heparin this morning is on a fixed rate at 900 units.  I talked to pharmacy about titrating her up to therapeutic range without a bolus.  We will hold her DOAC until she is closer to discharge.  Still having some abdominal pain which I think is from her RP hematoma.  Does not appear toxic.  Will check another hemoglobin tomorrow morning.  Very slow to mobilize at this time.  Marty Heck 07/13/2019 10:26 AM --

## 2019-07-13 NOTE — Evaluation (Signed)
Occupational Therapy Evaluation Patient Details Name: Barbara Thomas MRN: [id number] DOB: 1950/05/30 Today's Date: 07/13/2019    History of Present Illness 69 y.o. F admitted on 07/07/2019 with acute IVC filter occlusion, BLE DVT. Pt s/p mechanical thrombectomy of IVC, illiac, and femoral veins bilaterally with IVC filter removal performed  07/08/2019. PMHx OSA on CPAP; HLD; DM, h/o DVT/PE, cancer, arthritis   Clinical Impression   Pt admitted with see above. Pt currently with functional limitations due to the deficits listed below (see OT Problem List). Pt completed supine to sitting with HOB elevated with supervision to modified independence due to abdominal pain, sitting to standing from elevated surface with min assistance to FW, pt was able to take a couple steps with FW in min guard to min assist.  Pt will benefit from skilled OT to increase their safety and independence with ADL and functional mobility for ADL to facilitate discharge to venue listed below.       Follow Up Recommendations  CIR;Supervision/Assistance - 24 hour    Equipment Recommendations  Tub/shower seat    Recommendations for Other Services       Precautions / Restrictions Precautions Precautions: None Restrictions Weight Bearing Restrictions: No      Mobility Bed Mobility Overal bed mobility: Needs Assistance Bed Mobility: Supine to Sit;Sit to Supine     Supine to sit: HOB elevated;Modified independent (Device/Increase time) Sit to supine: Modified independent (Device/Increase time);HOB elevated   General bed mobility comments: pt requires increase time due to pain   Transfers Overall transfer level: Needs assistance Equipment used: Rolling walker (2 wheeled) Transfers: Sit to/from Stand Sit to Stand: Min assist;From elevated surface         General transfer comment: pt requires increase time and from elvated surface and 2 trials     Balance Overall balance assessment: Needs  assistance Sitting-balance support: Bilateral upper extremity supported                                       ADL either performed or assessed with clinical judgement   ADL Overall ADL's : Needs assistance/impaired Eating/Feeding: Independent;Sitting   Grooming: Wash/dry hands;Wash/dry face;Modified independent;Sitting   Upper Body Bathing: Set up;Sitting   Lower Body Bathing: Moderate assistance;Cueing for safety;Cueing for sequencing;Sit to/from stand   Upper Body Dressing : Set up;Sitting   Lower Body Dressing: Moderate assistance;Cueing for safety;Cueing for sequencing;Sit to/from stand   Toilet Transfer: Minimal assistance;Cueing for safety;Cueing for sequencing;Comfort height toilet   Toileting- Clothing Manipulation and Hygiene: Minimal assistance;Cueing for safety;Cueing for sequencing;Sit to/from stand   Tub/ Banker: Moderate assistance;Cueing for safety;Cueing for sequencing   Functional mobility during ADLs: Minimal assistance;Rolling walker       Vision   Vision Assessment?: No apparent visual deficits     Perception Perception Perception Tested?: No   Praxis Praxis Praxis tested?: Not tested    Pertinent Vitals/Pain Pain Assessment: Faces Faces Pain Scale: Hurts a little bit Pain Location: abdomen, LLE Pain Descriptors / Indicators: Discomfort;Grimacing;Guarding Pain Intervention(s): Monitored during session;Limited activity within patient's tolerance;Repositioned     Hand Dominance     Extremity/Trunk Assessment Upper Extremity Assessment Upper Extremity Assessment: Generalized weakness   Lower Extremity Assessment Lower Extremity Assessment: Defer to PT evaluation       Communication Communication Communication: No difficulties   Cognition Arousal/Alertness: Awake/alert Behavior During Therapy: WFL for tasks assessed/performed Overall Cognitive Status: Within Functional Limits  for tasks assessed                                      General Comments       Exercises     Shoulder Instructions      Home Living Family/patient expects to be discharged to:: Private residence Living Arrangements: Spouse/significant other Available Help at Discharge: Family;Other (Comment)(daughter in from out of town and can assist as needed) Type of Home: House Home Access: Stairs to enter [employer] of Steps: 2   [place]: One level     Bathroom Shower/Tub: Occupational psychologist: Standard Bathroom Accessibility: Yes How Accessible: Accessible via walker Home Equipment: [place] - 2 wheels;Bedside commode;Other (comment);Cane - single point;Crutches          Prior Functioning/Environment Level of Independence: Independent        Comments: however reported was becoming more difficult prior to ED stay        OT Problem List: Decreased strength;Decreased range of motion;Decreased activity tolerance;Impaired balance (sitting and/or standing);Decreased safety awareness;Decreased knowledge of use of DME or AE;Pain;Increased edema      OT Treatment/Interventions: Self-care/ADL training;DME and/or AE instruction;Therapeutic activities;Patient/family education;Balance training    OT Goals(Current goals can be found in the care plan section) Acute Rehab OT Goals Patient Stated Goal: Return home OT Goal Formulation: With patient Time For Goal Achievement: 07/27/19 Potential to Achieve Goals: Good ADL Goals Pt Will Perform Lower Body Dressing: with min guard assist;sit to/from stand Pt Will Transfer to Toilet: with min guard assist;ambulating;regular height toilet Pt Will Perform Tub/Shower Transfer: with min guard assist;ambulating;shower seat  OT Frequency: Min 2X/week   Barriers to D/C:            Co-evaluation              AM-PAC OT "6 Clicks" Daily Activity     Outcome Measure Help from another person eating meals?: None Help from another person  taking care of personal grooming?: A Little Help from another person toileting, which includes using toliet, bedpan, or urinal?: A Little Help from another person bathing (including washing, rinsing, drying)?: A Little Help from another person to put on and taking off regular upper body clothing?: A Lot Help from another person to put on and taking off regular lower body clothing?: A Little 6 Click Score: 18   End of Session Equipment Utilized During Treatment: Gait belt;Rolling walker  Activity Tolerance: Patient limited by fatigue Patient left: in bed;with call bell/phone within reach;with bed alarm set  OT Visit Diagnosis: Unsteadiness on feet (R26.81);Muscle weakness (generalized) (M62.81)                Time: [id number] OT Time Calculation (min): 34 min Charges:  OT General Charges $OT Visit: 1 Visit OT Evaluation $OT Eval Low Complexity: 1 Low OT Treatments $Self Care/Home Management : 8-22 mins  Joeseph Amor OTR/L  Acute Rehab Services  [phone] office number [phone] pager number   Joeseph Amor 07/13/2019, 3:47 PM

## 2019-07-13 NOTE — Progress Notes (Signed)
PROGRESS NOTE  Barbara Thomas PTW:[id number] DOB: 1950-11-28 DOA: 07/07/2019 PCP: Ann Held, DO  HPI/Recap of past 24 hours:  She is moved out of icu, bp/heart rate has improved Blood glucose better controlled Still no bm ( no bm for a week), poor appetite, denies ab pain, no n/v, no fever Family at bedside  Assessment/Plan: Principal Problem:   Venous thromboembolism confirmed by diagnostic testing Active Problems:   Obesity (BMI 30-39.9)   OSA (obstructive sleep apnea)   Hyperlipidemia LDL goal <100   Diabetes mellitus type 2 in obese (HCC)   Nausea   Acute respiratory failure with hypoxia (HCC)  Acute IVC filter occlusion with bilateral lower extremity DVT S/p Placement of bilateral lower extremity lytic catheter 50 cm treatment length from the filter to the mid femoral vein Now postoperative day 2 from IVC filter removal and venous thrombectomy Retroperitoneal bleeding/required pressor support and PRBC transfusion Currently on heparin drip Plan per vascular surgery  Acute blood loss anemia -Status post PRBC transfusion -management per vascular surgery  Diabetes, patient report was never treated previously -A1c 11.2 -started on insulin since admission, continue adjust insulin -Diabetes and diet education Will need to discharge on insulin and metformin lipid panel, ldl 63, hdl 36, triglyceride 160, on fish oil at home  Hypokalemia: replace k  Severe constipation, started on sennokot bid/ miralax bid, add on suppository    Obesity/OSA on nightly CPAP at home Body mass index is 31.82 kg/m.    DVT Prophylaxis: Anticoagulated as above  Code Status: Full  Family Communication: family at bedside daily  Disposition Plan:    Patient came from: Home  Anticipated d/c place: CIR  Barriers to d/c OR conditions which need to be met to effect  a safe d/c: Not ready to discharge, needs vascular surgery clearance  Consultants:  Vascular surgery  Procedures:  As above  Antibiotics:  None    Objective: BP 137/66 (BP Location: Left Arm)   Pulse 97   Temp 98.3 F (36.8 C) (Oral)   Resp 20   Ht '5\' 11"'  (1.803 m)   Wt 117.3 kg   SpO2 94%   BMI 36.07 kg/m   Intake/Output Summary (Last 24 hours) at 07/13/2019 0717 Last data filed at 07/13/2019 7001 Gross per 24 hour  Intake 271.18 ml  Output 4025 ml  Net -3753.82 ml   Filed Weights   07/07/19 0150 07/07/19 1612 07/12/19 0600  Weight: 101.6 kg 103.5 kg 117.3 kg    Exam: Patient is examined daily including today on 07/13/2019, exams remain the same as of yesterday except that has changed    General:  NAD  Cardiovascular: Slightly sinus tachycardia , improved   Respiratory: CTABL  Abdomen: Soft/ND/NT, positive BS  Musculoskeletal: Right lower extremity edema  Neuro: alert, oriented   Data Reviewed: Basic Metabolic Panel: Recent Labs  Lab 07/09/19 2023 07/10/19 0457 07/11/19 0432 07/12/19 0433 07/13/19 0246  NA 138 140 137 140 139  K 4.2 3.9 3.5 3.6 3.2*  CL 106 112* 108 109 105  CO2 24 13* 23 18* 26  GLUCOSE 302* 264* 201* 180* 179*  BUN '13 16 11 10 ' 7*  CREATININE 0.79 0.91 0.72 0.63 0.67  CALCIUM 6.9* 7.1* 7.7* 8.1* 8.3*  MG  --   --  1.8  --  1.9   Liver Function Tests: Recent Labs  Lab 07/07/19 0834  AST 16  ALT 28  ALKPHOS 122  BILITOT 1.1  PROT 7.5  ALBUMIN 4.1  No results for input(s): LIPASE, AMYLASE in the last 168 hours. No results for input(s): AMMONIA in the last 168 hours. CBC: Recent Labs  Lab 07/07/19 0834 07/08/19 0217 07/11/19 1641 07/11/19 2136 07/12/19 0738 07/12/19 1829 07/13/19 0246  WBC 10.2   < > 9.8 9.8 9.3 7.4 6.8  NEUTROABS 7.6  --   --   --   --   --   --   HGB 13.3   < > 9.6* 10.1* 10.8* 10.2* 9.3*  HCT 41.2   < > 29.2* 30.9* 31.1* 30.5* 28.1*  MCV 86.9   < > 86.4 87.0 82.7 87.1 86.5  PLT  163   < > 104* 112* 109* 110* 121*   < > = values in this interval not displayed.   Cardiac Enzymes:   No results for input(s): CKTOTAL, CKMB, CKMBINDEX, TROPONINI in the last 168 hours. BNP (last 3 results) No results for input(s): BNP in the last 8760 hours.  ProBNP (last 3 results) No results for input(s): PROBNP in the last 8760 hours.  CBG: Recent Labs  Lab 07/11/19 2136 07/12/19 0559 07/12/19 1232 07/12/19 1634 07/13/19 0612  GLUCAP 198* 159* 242* 190* 145*    Recent Results (from the past 240 hour(s))  SARS CORONAVIRUS 2 (TAT 6-24 HRS) Nasopharyngeal Nasopharyngeal Swab     Status: None   Collection Time: 07/07/19  3:33 PM   Specimen: Nasopharyngeal Swab  Result Value Ref Range Status   SARS Coronavirus 2 NEGATIVE NEGATIVE Final    Comment: (NOTE) SARS-CoV-2 target nucleic acids are NOT DETECTED. The SARS-CoV-2 RNA is generally detectable in upper and lower respiratory specimens during the acute phase of infection. Negative results do not preclude SARS-CoV-2 infection, do not rule out co-infections with other pathogens, and should not be used as the sole basis for treatment or other patient management decisions. Negative results must be combined with clinical observations, patient history, and epidemiological information. The expected result is Negative. Fact Sheet for Patients: SugarRoll.be Fact Sheet for Healthcare Providers: https://www.woods-mathews.com/ This test is not yet approved or cleared by the [place] FDA and  has been authorized for detection and/or diagnosis of SARS-CoV-2 by FDA under an Emergency Use Authorization (EUA). This EUA will remain  in effect (meaning this test can be used) for the duration of the COVID-19 declaration under Section 56 4(b)(1) of the Act, 21 U.S.C. section 360bbb-3(b)(1), unless the authorization is terminated or revoked sooner. Performed at [facility], [place]  [address], [place], [place] [address]   MRSA PCR Screening     Status: None   Collection Time: 07/08/19  5:40 PM   Specimen: Nasal Mucosa; Nasopharyngeal  Result Value Ref Range Status   MRSA by PCR NEGATIVE NEGATIVE Final    Comment:        The GeneXpert MRSA Assay (FDA approved for NASAL specimens only), is one component of a comprehensive MRSA colonization surveillance program. It is not intended to diagnose MRSA infection nor to guide or monitor treatment for MRSA infections. Performed at [facility], [place] [address], [place], [place] [address]      Studies: No results found.  Scheduled Meds: . bisacodyl  10 mg Rectal Daily  . Chlorhexidine Gluconate Cloth  6 each Topical Daily  . feeding supplement (GLUCERNA SHAKE)  237 mL Oral TID BM  . insulin aspart  0-15 Units Subcutaneous TID WC  . insulin aspart  0-5 Units Subcutaneous QHS  . insulin aspart  3 Units Subcutaneous  TID WC  . insulin glargine  10 Units Subcutaneous BID  . living well with diabetes book   Does not apply Once  . montelukast  10 mg Oral QHS  . polyethylene glycol  17 g Oral Daily  . potassium chloride  40 mEq Oral Q4H  . scopolamine  1 patch Transdermal Q72H  . senna-docusate  1 tablet Oral BID  . simvastatin  40 mg Oral QHS  . sodium chloride flush  3 mL Intravenous Q12H  . sodium chloride flush  3 mL Intravenous Q12H    Continuous Infusions: . sodium chloride Stopped (07/10/19 1213)  . heparin 900 Units/hr (07/12/19 1727)  . phenylephrine (NEO-SYNEPHRINE) Adult infusion Stopped (07/10/19 0934)     Time spent: 68mns I have personally reviewed and interpreted on  07/13/2019 daily labs, tele strips, imagings as discussed above under date review session and assessment and plans.  I reviewed all nursing notes, pharmacy notes, consultant notes,  vitals, pertinent old records  I have discussed plan of care as described above with RN , patient and family on 07/13/2019   FFlorencia ReasonsMD, PhD,  FACP  [facility]  Available via Epic secure chat 7am-7pm for nonurgent issues Please page for urgent issues, pager number available through [email or web address] .   07/13/2019, 7:17 AM  LOS: 6 days

## 2019-07-13 NOTE — Progress Notes (Signed)
[place] for Heparin per [facility]VS  Indication: DVT  Patient Measurements: Height: 5\' 11"  (180.3 cm) Weight: 117.3 kg (258 lb 9.6 oz) IBW/kg (Calculated) : 70.8 Heparin Dosing Weight: 92.4 kg  Vital Signs: Temp: 97.5 F (36.4 C) (05/01 0937) Temp Source: Oral (05/01 0937) BP: 143/64 (05/01 0937) Pulse Rate: 93 (05/01 0937)  Labs: Recent Labs    07/11/19 0432 07/11/19 0915 07/11/19 1641 07/11/19 2136 07/12/19 0433 07/12/19 0738 07/12/19 0738 07/12/19 1829 07/13/19 0246  HGB 7.2*  --    < >   < >  --  10.8*   < > 10.2* 9.3*  HCT 21.3*  --    < >   < >  --  31.1*  --  30.5* 28.1*  PLT 99*  --    < >   < >  --  109*  --  110* 121*  LABPROT  --  13.4  --   --   --   --   --   --   --   INR  --  1.1  --   --   --   --   --   --   --   HEPARINUNFRC <0.10*  --    < >  --  <0.10*  --   --  <0.10* <0.10*  CREATININE 0.72  --   --   --  0.63  --   --   --  0.67   < > = values in this interval not displayed.    Estimated Creatinine Clearance: 96.3 mL/min (by C-G formula based on SCr of 0.67 mg/dL).  Assessment: 69 year old female s/p mechanical thrombectomy of IVC, iliac, and femoral viens bilaterally with IVC filter removal post operative on IV heparin. CT scan demonstrates retroperitoneal hematoma and patient received 2 units PRBCs 4/29 with improvement in Hg. Pharmacy consulted to help dose heparin.   Heparin level remains undetectable this morning on heparin rate of 900 units/hr. Hg decreased form 10.2 to 9.3 (s/p 2 units of PRBC 4/29), plts 121 (low but stable). No active bleed issues reported.   Goal of Therapy:  Heparin level 0.2 to 0.3 -- MD dosing units/ml Monitor platelets by anticoagulation protocol: Yes   Plan:  Increase heparin drip to1150 units/hr with no bolus per VSS request Obtain 6 hour heparin level Monitor daily heparin level and CBC, s/sx bleeding If pt can tolerate full dose anticoag, f/u plant to switch to PO  anticoag   Sherren Kerns, PharmD PGY1 Acute Care Pharmacy Resident Clinical Pharmacist 07/13/2019 10:11 AM

## 2019-07-13 NOTE — Progress Notes (Signed)
ANTICOAGULATION CONSULT NOTE  Pharmacy Consult for Heparin per VVS  Indication: DVT  Patient Measurements: Height: 5\' 11"  (180.3 cm) Weight: 117.3 kg (258 lb 9.6 oz) IBW/kg (Calculated) : 70.8 Heparin Dosing Weight: 92.4 kg  Vital Signs: Temp: 98.6 F (37 C) (05/01 1141) Temp Source: Oral (05/01 1141) BP: 145/68 (05/01 1141) Pulse Rate: 91 (05/01 1141)  Labs: Recent Labs    07/11/19 0432 07/11/19 0915 07/11/19 1641 07/11/19 2136 07/12/19 0433 07/12/19 0433 07/12/19 0738 07/12/19 1829 07/13/19 0246 07/13/19 1556  HGB 7.2*  --    < >   < >  --    < > 10.8* 10.2* 9.3*  --   HCT 21.3*  --    < >   < >  --   --  31.1* 30.5* 28.1*  --   PLT 99*  --    < >   < >  --   --  109* 110* 121*  --   LABPROT  --  13.4  --   --   --   --   --   --   --   --   INR  --  1.1  --   --   --   --   --   --   --   --   HEPARINUNFRC <0.10*  --    < >  --  <0.10*   < >  --  <0.10* <0.10* 0.52  CREATININE 0.72  --   --   --  0.63  --   --   --  0.67  --    < > = values in this interval not displayed.    Estimated Creatinine Clearance: 96.3 mL/min (by C-G formula based on SCr of 0.67 mg/dL).  Assessment: 69 year old female s/p mechanical thrombectomy of IVC, iliac, and femoral viens bilaterally with IVC filter removal post operative on IV heparin. CT scan demonstrates retroperitoneal hematoma and patient received 2 units PRBCs 4/29 with improvement in Hg. Pharmacy consulted to help dose heparin.   Heparin level is now slightly supratherapeutic at 0.52 on 1150 units/hr of IV heparin. RN reports no s/s of overt bleeding   Goal of Therapy:  Heparin level 0.3-0.5 units/ml Monitor platelets by anticoagulation protocol: Yes   Plan:  Decrease heparin infusion slightly to 1100. No boluses per VSS request Obtain 6 hour heparin level Monitor daily heparin level and CBC, s/sx bleeding If pt can tolerate full dose anticoag, f/u plant to switch to [place], PharmD., BCPS,  BCCCP Clinical Pharmacist Clinical phone for 07/13/19 until 11pm: [phone] If after 11pm, please refer to [facility] for unit-specific pharmacist

## 2019-07-14 LAB — CBC
HCT: 31.3 % — ABNORMAL LOW (ref 36.0–46.0)
Hemoglobin: 10.2 g/dL — ABNORMAL LOW (ref 12.0–15.0)
MCH: 29.1 pg (ref 26.0–34.0)
MCHC: 32.6 g/dL (ref 30.0–36.0)
MCV: 89.4 fL (ref 80.0–100.0)
Platelets: 162 10*3/uL (ref 150–400)
RBC: 3.5 MIL/uL — ABNORMAL LOW (ref 3.87–5.11)
RDW: 18 % — ABNORMAL HIGH (ref 11.5–15.5)
WBC: 9 10*3/uL (ref 4.0–10.5)
nRBC: 0.6 % — ABNORMAL HIGH (ref 0.0–0.2)

## 2019-07-14 LAB — GLUCOSE, CAPILLARY
Glucose-Capillary: 156 mg/dL — ABNORMAL HIGH (ref 70–99)
Glucose-Capillary: 140 mg/dL — ABNORMAL HIGH (ref 70–99)
Glucose-Capillary: 211 mg/dL — ABNORMAL HIGH (ref 70–99)
Glucose-Capillary: 193 mg/dL — ABNORMAL HIGH (ref 70–99)
Glucose-Capillary: 225 mg/dL — ABNORMAL HIGH (ref 70–99)

## 2019-07-14 LAB — HEPARIN LEVEL (UNFRACTIONATED)
Heparin Unfractionated: 0.33 IU/mL (ref 0.30–0.70)
Heparin Unfractionated: 0.33 IU/mL (ref 0.30–0.70)
Heparin Unfractionated: 0.45 IU/mL (ref 0.30–0.70)

## 2019-07-14 LAB — MAGNESIUM: Magnesium: 1.9 mg/dL (ref 1.7–2.4)

## 2019-07-14 LAB — BASIC METABOLIC PANEL
Anion gap: 9 (ref 5–15)
BUN: 6 mg/dL — ABNORMAL LOW (ref 8–23)
CO2: 24 mmol/L (ref 22–32)
Calcium: 8.4 mg/dL — ABNORMAL LOW (ref 8.9–10.3)
Chloride: 104 mmol/L (ref 98–111)
Creatinine, Ser: 0.68 mg/dL (ref 0.44–1.00)
GFR calc Af Amer: >60 mL/min (ref >60)
GFR calc non Af Amer: >60 mL/min (ref >60)
Glucose, Bld: 169 mg/dL — ABNORMAL HIGH (ref 70–99)
Potassium: 3.8 mmol/L (ref 3.5–5.1)
Sodium: 137 mmol/L (ref 135–145)

## 2019-07-14 MED ORDER — POLYETHYLENE GLYCOL 3350 17 G PO PACK
17.0000 g | PACK | Freq: Two times a day (BID) | ORAL | Status: DC
  Filled 2019-07-14 (×2): qty 1

## 2019-07-14 MED ORDER — POTASSIUM CHLORIDE CRYS ER 20 MEQ PO TBCR
40.0000 meq | EXTENDED_RELEASE_TABLET | Freq: Once | ORAL | Status: AC
  Administered 2019-07-14: 12:00:00 40 meq via ORAL
  Filled 2019-07-14: qty 2

## 2019-07-14 NOTE — Progress Notes (Addendum)
ANTICOAGULATION CONSULT NOTE  Pharmacy Consult for Heparin per VVS  Indication: DVT  Patient Measurements: Height: 5\' 11"  (180.3 cm) Weight: 117.3 kg (258 lb 9.6 oz) IBW/kg (Calculated) : 70.8 Heparin Dosing Weight: 92.4 kg  Vital Signs: Temp: 98.4 F (36.9 C) (05/02 0849) Temp Source: Oral (05/02 0849) BP: 143/67 (05/02 0849) Pulse Rate: 95 (05/02 0849)  Labs: Recent Labs    07/12/19 0433 07/12/19 0738 07/12/19 1829 07/12/19 1829 07/13/19 0246 07/13/19 1556 07/13/19 2319 07/14/19 0030 07/14/19 0734  HGB  --    < > 10.2*   < > 9.3*  --   --  10.2*  --   HCT  --    < > 30.5*  --  28.1*  --   --  31.3*  --   PLT  --    < > 110*  --  121*  --   --  162  --   HEPARINUNFRC <0.10*   < > <0.10*   < > <0.10*   < > 0.51 0.45 0.33  CREATININE 0.63  --   --   --  0.67  --   --  0.68  --    < > = values in this interval not displayed.    Estimated Creatinine Clearance: 96.3 mL/min (by C-G formula based on SCr of 0.68 mg/dL).  Assessment: 69 year old female s/p mechanical thrombectomy of IVC, iliac, and femoral viens bilaterally with IVC filter removal post operative on IV heparin. CT scan demonstrates retroperitoneal hematoma and patient received 2 units PRBCs 4/29 with improvement in Hg. Pharmacy consulted to help dose heparin.   Repeat heparin level is therapeutic at 0.33 on 1100 units/hr of IV heparin. RN reports no s/s of overt bleeding or problems with infusion. Hg/Hct and plts all stable.  Confirmatory heparin level exactly the same at 0.33 on rate of 1100 units/hr. No new bleeding concerns.  Goal of Therapy:  Heparin level 0.3-0.5 units/ml Monitor platelets by anticoagulation protocol: Yes   Plan:  Continue heparin infusion at 1100. Monitor daily heparin level and CBC, s/sx bleeding If pt can tolerate full dose anticoag, f/u plant to switch to PO anticoag   Sherren Kerns, PharmD PGY1 Acute Care Pharmacy Resident Clinical phone for 07/13/19 until 11pm: [phone] If  after 11pm, please refer to [facility] for unit-specific pharmacist

## 2019-07-14 NOTE — Progress Notes (Signed)
HCPAP in use.

## 2019-07-14 NOTE — Progress Notes (Addendum)
ANTICOAGULATION CONSULT NOTE  Pharmacy Consult for Heparin per VVS  Indication: DVT  Patient Measurements: Height: 5\' 11"  (180.3 cm) Weight: 117.3 kg (258 lb 9.6 oz) IBW/kg (Calculated) : 70.8 Heparin Dosing Weight: 92.4 kg  Vital Signs: Temp: 98.1 F (36.7 C) (05/02 0502) Temp Source: Oral (05/02 0502) BP: 142/90 (05/02 0502) Pulse Rate: 84 (05/02 0502)  Labs: Recent Labs    07/11/19 0915 07/11/19 1641 07/12/19 0433 07/12/19 0738 07/12/19 1829 07/12/19 1829 07/13/19 0246 07/13/19 0246 07/13/19 1556 07/13/19 2319 07/14/19 0030  HGB  --    < >  --    < > 10.2*   < > 9.3*  --   --   --  10.2*  HCT  --    < >  --    < > 30.5*  --  28.1*  --   --   --  31.3*  PLT  --    < >  --    < > 110*  --  121*  --   --   --  162  LABPROT 13.4  --   --   --   --   --   --   --   --   --   --   INR 1.1  --   --   --   --   --   --   --   --   --   --   HEPARINUNFRC  --    < > <0.10*   < > <0.10*   < > <0.10*   < > 0.52 0.51 0.45  CREATININE  --   --  0.63  --   --   --  0.67  --   --   --  0.68   < > = values in this interval not displayed.    Estimated Creatinine Clearance: 96.3 mL/min (by C-G formula based on SCr of 0.68 mg/dL).  Assessment: 69 year old female s/p mechanical thrombectomy of IVC, iliac, and femoral viens bilaterally with IVC filter removal post operative on IV heparin. CT scan demonstrates retroperitoneal hematoma and patient received 2 units PRBCs 4/29 with improvement in Hg. Pharmacy consulted to help dose heparin.   Heparin level is now supratherapeutic at 0.45 on 1100 units/hr of IV heparin. RN reports no s/s of overt bleeding or problems with infusion. Hg/Hct and plts all stable.  Goal of Therapy:  Heparin level 0.3-0.5 units/ml Monitor platelets by anticoagulation protocol: Yes   Plan:  Continue heparin infusion at 1100. Obtain 6 hour confirmatory heparin level Monitor daily heparin level and CBC, s/sx bleeding If pt can tolerate full dose anticoag, f/u  plant to switch to PO anticoag   Sherren Kerns, PharmD PGY1 Acute Care Pharmacy Resident Clinical phone for 07/13/19 until 11pm: [phone] If after 11pm, please refer to [facility] for unit-specific pharmacist

## 2019-07-14 NOTE — Progress Notes (Signed)
ANTICOAGULATION CONSULT NOTE - Follow Up Consult  Pharmacy Consult for heparin Indication: DVT  Labs: Recent Labs    07/11/19 0432 07/11/19 0915 07/11/19 1641 07/11/19 2136 07/12/19 0433 07/12/19 0433 07/12/19 0738 07/12/19 1829 07/12/19 1829 07/13/19 0246 07/13/19 1556 07/13/19 2319  HGB 7.2*  --    < >   < >  --    < > 10.8* 10.2*  --  9.3*  --   --   HCT 21.3*  --    < >   < >  --   --  31.1* 30.5*  --  28.1*  --   --   PLT 99*  --    < >   < >  --   --  109* 110*  --  121*  --   --   LABPROT  --  13.4  --   --   --   --   --   --   --   --   --   --   INR  --  1.1  --   --   --   --   --   --   --   --   --   --   HEPARINUNFRC <0.10*  --    < >  --  <0.10*   < >  --  <0.10*   < > <0.10* 0.52 0.51  CREATININE 0.72  --   --   --  0.63  --   --   --   --  0.67  --   --    < > = values in this interval not displayed.    Assessment/Plan:  69yo female therapeutic on heparin. Will continue gtt at current rate and confirm stable with am labs.   Wynona Neat, PharmD, BCPS  07/14/2019,12:17 AM

## 2019-07-14 NOTE — Progress Notes (Addendum)
Progress Note    07/14/2019 8:20 AM 5 Days Post-Op  Subjective:  She says she initially felt well upon awakening this morning, however since insulin administration she is now extremely nauseated.  Continues to complain of mild lower abdominal pain.  She did have a small bowel movement last night  Was able to sit up in bed and stand take a few steps with minimal assistance yesterday.  Vitals:   07/13/19 2335 07/14/19 0502  BP: (!) 135/56 (!) 142/90  Pulse: 93 84  Resp: 20 20  Temp: 99.5 F (37.5 C) 98.1 F (36.7 C)  SpO2: 94% 96%   Tmax 99.5 at 2335  Physical Exam: Cardiac: Rate and rhythm are regular Lungs: Nonlabored Extremities: Thigh-high compression stockings in place.  Both feet are warm.  She has active range of motion and intact sensation. Abdomen: Nondistended, soft and pliable.  Normoactive bowel sounds.  Mild tenderness to palpation in the right lower aspect of her abdomen without peritoneal signs.  CBC    Component Value Date/Time   WBC 9.0 07/14/2019 0030   RBC 3.50 (L) 07/14/2019 0030   HGB 10.2 (L) 07/14/2019 0030   HGB 14.1 03/13/2013 1028   HCT 31.3 (L) 07/14/2019 0030   HCT 43.5 03/13/2013 1028   PLT 162 07/14/2019 0030   PLT 225 03/13/2013 1028   MCV 89.4 07/14/2019 0030   MCV 87 03/13/2013 1028   MCH 29.1 07/14/2019 0030   MCHC 32.6 07/14/2019 0030   RDW 18.0 (H) 07/14/2019 0030   RDW 14.2 03/13/2013 1028   LYMPHSABS 1.8 07/07/2019 0834   LYMPHSABS 1.3 03/13/2013 1028   MONOABS 0.7 07/07/2019 0834   EOSABS 0.1 07/07/2019 0834   EOSABS 0.2 03/13/2013 1028   BASOSABS 0.1 07/07/2019 0834   BASOSABS 0.0 03/13/2013 1028    BMET    Component Value Date/Time   NA 137 07/14/2019 0030   K 3.8 07/14/2019 0030   CL 104 07/14/2019 0030   CO2 24 07/14/2019 0030   GLUCOSE 169 (H) 07/14/2019 0030   BUN 6 (L) 07/14/2019 0030   CREATININE 0.68 07/14/2019 0030   CALCIUM 8.4 (L) 07/14/2019 0030   GFRNONAA >60 07/14/2019 0030   GFRAA >60 07/14/2019  0030     Intake/Output Summary (Last 24 hours) at 07/14/2019 0820 Last data filed at 07/14/2019 [id number] Gross per 24 hour  Intake 658.9 ml  Output 4370 ml  Net -3711.1 ml    HOSPITAL MEDICATIONS Scheduled Meds: . bisacodyl  10 mg Rectal Daily  . Chlorhexidine Gluconate Cloth  6 each Topical Daily  . feeding supplement (GLUCERNA SHAKE)  237 mL Oral TID BM  . insulin aspart  0-15 Units Subcutaneous TID WC  . insulin aspart  0-5 Units Subcutaneous QHS  . insulin aspart  3 Units Subcutaneous TID WC  . insulin glargine  10 Units Subcutaneous BID  . living well with diabetes book   Does not apply Once  . montelukast  10 mg Oral QHS  . polyethylene glycol  17 g Oral Daily  . scopolamine  1 patch Transdermal Q72H  . senna-docusate  1 tablet Oral BID  . simvastatin  40 mg Oral QHS  . sodium chloride flush  3 mL Intravenous Q12H  . sodium chloride flush  3 mL Intravenous Q12H   Continuous Infusions: . sodium chloride Stopped (07/10/19 1213)  . heparin 1,100 Units/hr (07/13/19 2031)   PRN Meds:.sodium chloride, acetaminophen **OR** acetaminophen, bisacodyl, cyclobenzaprine, hydrALAZINE, ketorolac, metoprolol tartrate, midazolam, ondansetron (ZOFRAN) IV, ondansetron (ZOFRAN)  IV, oxyCODONE-acetaminophen, phenol, sodium chloride flush, zolpidem  Assessment:  69 y.o. female is s/p: bilateral lower extremity thrombolysis through popliteal vein access and subsequent angio vac with retrieval of thrombus from her vena cava and removal of old vena cava filter.  Extremely nauseated after insulin administration.  Heparin infusion therapeutic.  Was out of bed and ambulated short distance yesterday  Hemoglobin is up today and she has had good diuresis with over 4 L urine output. 5 Days Post-Op  Plan: -Check fingerstick blood sugar now and give Zofran. -She was on aspirin 81 mg prior to admission.  Once we begin oral anticoagulation, will consider restarting.  Unclear of the indication for this  therapy. -DVT prophylaxis:  Heparin infusion -She is requesting Rx for lift chair   Risa Grill, PA-C [facility] [phone] 07/14/2019  8:20 AM   I have seen and evaluated the patient. I agree with the PA note as documented above.   69 year old female now status post bilateral lower extremity thrombolysis through popliteal vein access and subsequent angio vac with retrieval of thrombus from her vena cava and removal of old vena cava filter.  Hemoglobin has been stable over past 24 hours and is actually increased from 9.3 to 10.2 today.  She is now therapeutic on heparin and appreciate pharmacy assistance.  We will hold her DOAC until closer to discharge.  Still having some abdominal pain with limited p.o. intake, nausea, and slow mobility.  Likely from RP hematoma.  I do not think she is ready for discharge at this time.  Try to mobilize today.  Her legs do look better after another dose of Lasix yesterday and she is wearing her compression socks.   Marty Heck, MD [facility] [place] Office: [place]

## 2019-07-14 NOTE — Progress Notes (Signed)
PROGRESS NOTE  Barbara Thomas OHY:[id number] DOB: 10/29/1950 DOA: 07/07/2019 PCP: Ann Held, DO  Brief summary:  Barbara Thomas is a 69 y.o. female with medical history significant of OSA on CPAP; HLD; DM; and h/o DVT/PE presenting with RLE pain and swelling from knee to foot.     drove to [place] for a wedding and then drove back to ER due to leg pain. She had a clot in the past when she broke her ankle.  She was sent home from CIR with a cast.  She got "bad" blood clots in both legs and lungs and a filter was placed.  That was in 2011 and she has had no problems since.  She was on blood thinners for maybe 4-5 years, until she saw Dr. Marin Olp and he took her off.  She has taken 81 mg ASA religiously since then.    She was very thirsty at the rehearsal dinner.  She drank a lot of sweet tea.  In April 2020 she had an A1c of 7.2 and she was not treated.    She broke her toe when she ran into a door.  She is wearing a hard shoe.   HPI/Recap of past 24 hours:  C/o right lower quadrant ab pain, feeling nauseous , no appetite, no fever Family at bedside  Assessment/Plan: Principal Problem:   Venous thromboembolism confirmed by diagnostic testing Active Problems:   Obesity (BMI 30-39.9)   OSA (obstructive sleep apnea)   Hyperlipidemia LDL goal <100   Diabetes mellitus type 2 in obese (HCC)   Nausea   Acute respiratory failure with hypoxia (HCC)  Acute IVC filter occlusion with bilateral lower extremity DVT -S/p Placement of bilateral lower extremity lytic catheter 50 cm treatment length from the filter to the mid femoral vein -Then  IVC filter removal and venous thrombectomy -Retroperitoneal bleeding/required pressor support and PRBC transfusion -Currently on heparin drip Plan per vascular surgery  Acute blood loss anemia -Status post PRBC transfusion -management per vascular surgery  Diabetes, patient report was never treated previously -A1c 11.2 -started on insulin  since admission, continue adjust insulin -Diabetes and diet education -Will need to discharge on insulin and metformin -lipid panel, ldl 63, hdl 36, triglyceride 160, on fish oil at home  Hypokalemia: replace k  Severe constipation, no bm for a week, started on sennokot bid/ miralax bid, add on suppository , increase activity kub ordered on 5/1 due to c/p ab pain, on acute findings.    Obesity/OSA on nightly CPAP at home Body mass index is 31.82 kg/m.    DVT Prophylaxis: Anticoagulated as above  Code Status: Full  Family Communication: family at bedside daily  Disposition Plan:    Patient came from: Home  Anticipated d/c place: CIR  Barriers to d/c OR conditions which need to be met to effect a safe d/c: needs vascular surgery clearance  Consultants:  Vascular surgery  Procedures:  As above  Antibiotics:  None    Objective: BP (!) 142/90 (BP Location: Left Arm)   Pulse 84   Temp 98.1 F (36.7 C) (Oral)   Resp 20   Ht _0  (1.803 m)   Wt 117.3 kg   SpO2 96%   BMI 36.07 kg/m   Intake/Output Summary (Last 24 hours) at 07/14/2019 0706 Last data filed at 07/14/2019 0618 Gross per 24 hour  Intake 658.9 ml  Output 4370 ml  Net -3711.1 ml   Filed Weights   07/07/19 0150 07/07/19 1612 07/12/19  0600  Weight: 101.6 kg 103.5 kg 117.3 kg    Exam: Patient is examined daily including today on 07/14/2019, exams remain the same as of yesterday except that has changed    General:  NAD  Cardiovascular: Slightly sinus tachycardia , improved   Respiratory: CTABL  Abdomen:  Right lower quadrant tenderness, no guarding, no rebound, Soft, positive BS  Musculoskeletal: Right lower extremity edema improving  Neuro: alert, oriented   Data Reviewed: Basic Metabolic Panel: Recent Labs  Lab 07/10/19 0457 07/11/19 0432 07/12/19 0433  07/13/19 0246 07/14/19 0030  NA 140 137 140 139 137  K 3.9 3.5 3.6 3.2* 3.8  CL 112* 108 109 105 104  CO2 13* 23 18* 26 24  GLUCOSE 264* 201* 180* 179* 169*  BUN _0 7* 6*  CREATININE 0.91 0.72 0.63 0.67 0.68  CALCIUM 7.1* 7.7* 8.1* 8.3* 8.4*  MG  --  1.8  --  1.9 1.9   Liver Function Tests: Recent Labs  Lab 07/07/19 0834  AST 16  ALT 28  ALKPHOS 122  BILITOT 1.1  PROT 7.5  ALBUMIN 4.1   No results for input(s): LIPASE, AMYLASE in the last 168 hours. No results for input(s): AMMONIA in the last 168 hours. CBC: Recent Labs  Lab 07/07/19 0834 07/08/19 0217 07/11/19 2136 07/12/19 0738 07/12/19 1829 07/13/19 0246 07/14/19 0030  WBC 10.2   < > 9.8 9.3 7.4 6.8 9.0  NEUTROABS 7.6  --   --   --   --   --   --   HGB 13.3   < > 10.1* 10.8* 10.2* 9.3* 10.2*  HCT 41.2   < > 30.9* 31.1* 30.5* 28.1* 31.3*  MCV 86.9   < > 87.0 82.7 87.1 86.5 89.4  PLT 163   < > 112* 109* 110* 121* 162   < > = values in this interval not displayed.   Cardiac Enzymes:   No results for input(s): CKTOTAL, CKMB, CKMBINDEX, TROPONINI in the last 168 hours. BNP (last 3 results) No results for input(s): BNP in the last 8760 hours.  ProBNP (last 3 results) No results for input(s): PROBNP in the last 8760 hours.  CBG: Recent Labs  Lab 07/13/19 0612 07/13/19 1138 07/13/19 1732 07/13/19 2132 07/14/19 0616  GLUCAP 145* 162* 197* 147* 156*    Recent Results (from the past 240 hour(s))  SARS CORONAVIRUS 2 (TAT 6-24 HRS) Nasopharyngeal Nasopharyngeal Swab     Status: None   Collection Time: 07/07/19  3:33 PM   Specimen: Nasopharyngeal Swab  Result Value Ref Range Status   SARS Coronavirus 2 NEGATIVE NEGATIVE Final    Comment: (NOTE) SARS-CoV-2 target nucleic acids are NOT DETECTED. The SARS-CoV-2 RNA is generally detectable in upper and lower respiratory specimens during the acute phase of infection. Negative results do not preclude SARS-CoV-2 infection, do not rule out co-infections  with other pathogens, and should not be used as the sole basis for treatment or other patient management decisions. Negative results must be combined with clinical observations, patient history, and epidemiological information. The expected result is Negative. Fact Sheet for Patients: SugarRoll.be Fact Sheet for Healthcare Providers: https://www.woods-mathews.com/ This test is not yet approved or cleared by the [place] FDA and  has been authorized for detection and/or diagnosis of SARS-CoV-2 by FDA under an Emergency Use Authorization (EUA). This EUA will remain  in effect (meaning this test can be used) for the duration of the COVID-19 declaration under Section 56 4(b)(1) of the Act,  21 U.S.C. section 360bbb-3(b)(1), unless the authorization is terminated or revoked sooner. Performed at [facility], [place] [address], [place], [place] [address]   MRSA PCR Screening     Status: None   Collection Time: 07/08/19  5:40 PM   Specimen: Nasal Mucosa; Nasopharyngeal  Result Value Ref Range Status   MRSA by PCR NEGATIVE NEGATIVE Final    Comment:        The GeneXpert MRSA Assay (FDA approved for NASAL specimens only), is one component of a comprehensive MRSA colonization surveillance program. It is not intended to diagnose MRSA infection nor to guide or monitor treatment for MRSA infections. Performed at [facility], [place] [address], [place], [place] [address]      Studies: DG Abd 1 View  Result Date: 07/13/2019 CLINICAL DATA:  Abdominal pain EXAM: ABDOMEN - 1 VIEW COMPARISON:  June 19, 2015 FINDINGS: The bowel gas pattern is normal. No radio-opaque calculi or other significant radiographic abnormality are seen. The previously noted IVC filter has been removed. IMPRESSION: Negative. Electronically Signed   By: Constance Holster M.D.   On: 07/13/2019 18:43    Scheduled Meds: . bisacodyl  10 mg Rectal Daily  . Chlorhexidine  Gluconate Cloth  6 each Topical Daily  . feeding supplement (GLUCERNA SHAKE)  237 mL Oral TID BM  . insulin aspart  0-15 Units Subcutaneous TID WC  . insulin aspart  0-5 Units Subcutaneous QHS  . insulin aspart  3 Units Subcutaneous TID WC  . insulin glargine  10 Units Subcutaneous BID  . living well with diabetes book   Does not apply Once  . montelukast  10 mg Oral QHS  . polyethylene glycol  17 g Oral Daily  . scopolamine  1 patch Transdermal Q72H  . senna-docusate  1 tablet Oral BID  . simvastatin  40 mg Oral QHS  . sodium chloride flush  3 mL Intravenous Q12H  . sodium chloride flush  3 mL Intravenous Q12H    Continuous Infusions: . sodium chloride Stopped (07/10/19 1213)  . heparin 1,100 Units/hr (07/13/19 2031)     Time spent: 103mns I have personally reviewed and interpreted on  07/14/2019 daily labs, tele strips, imagings as discussed above under date review session and assessment and plans.  I reviewed all nursing notes, pharmacy notes, consultant notes,  vitals, pertinent old records  I have discussed plan of care as described above with RN , patient and family on 07/14/2019   FFlorencia ReasonsMD, PhD, FACP  [facility]  Available via Epic secure chat 7am-7pm for nonurgent issues Please page for urgent issues, pager number available through [email or web address] .   07/14/2019, 7:06 AM  LOS: 7 days

## 2019-07-14 NOTE — Progress Notes (Signed)
HCPAP set up and ready for use.

## 2019-07-15 LAB — BASIC METABOLIC PANEL
Anion gap: 9 (ref 5–15)
BUN: 8 mg/dL (ref 8–23)
CO2: 24 mmol/L (ref 22–32)
Calcium: 8.5 mg/dL — ABNORMAL LOW (ref 8.9–10.3)
Chloride: 102 mmol/L (ref 98–111)
Creatinine, Ser: 0.69 mg/dL (ref 0.44–1.00)
GFR calc Af Amer: >60 mL/min (ref >60)
GFR calc non Af Amer: >60 mL/min (ref >60)
Glucose, Bld: 166 mg/dL — ABNORMAL HIGH (ref 70–99)
Potassium: 4.2 mmol/L (ref 3.5–5.1)
Sodium: 135 mmol/L (ref 135–145)

## 2019-07-15 LAB — CBC
HCT: 33.8 % — ABNORMAL LOW (ref 36.0–46.0)
Hemoglobin: 10.8 g/dL — ABNORMAL LOW (ref 12.0–15.0)
MCH: 28.7 pg (ref 26.0–34.0)
MCHC: 32 g/dL (ref 30.0–36.0)
MCV: 89.9 fL (ref 80.0–100.0)
Platelets: 218 10*3/uL (ref 150–400)
RBC: 3.76 MIL/uL — ABNORMAL LOW (ref 3.87–5.11)
RDW: 18.1 % — ABNORMAL HIGH (ref 11.5–15.5)
WBC: 9 10*3/uL (ref 4.0–10.5)
nRBC: 0.2 % (ref 0.0–0.2)

## 2019-07-15 LAB — HEPARIN LEVEL (UNFRACTIONATED): Heparin Unfractionated: 0.41 IU/mL (ref 0.30–0.70)

## 2019-07-15 LAB — GLUCOSE, CAPILLARY
Glucose-Capillary: 137 mg/dL — ABNORMAL HIGH (ref 70–99)
Glucose-Capillary: 211 mg/dL — ABNORMAL HIGH (ref 70–99)
Glucose-Capillary: 173 mg/dL — ABNORMAL HIGH (ref 70–99)
Glucose-Capillary: 155 mg/dL — ABNORMAL HIGH (ref 70–99)

## 2019-07-15 LAB — MAGNESIUM: Magnesium: 2 mg/dL (ref 1.7–2.4)

## 2019-07-15 MED ORDER — LIVING WELL WITH DIABETES BOOK
Freq: Once | Status: AC
  Filled 2019-07-15: qty 1

## 2019-07-15 MED ORDER — INSULIN NPH (HUMAN) (ISOPHANE) 100 UNIT/ML ~~LOC~~ SUSP
12.0000 [IU] | Freq: Two times a day (BID) | SUBCUTANEOUS | Status: DC
  Administered 2019-07-15 – 2019-07-16 (×2): 12 [IU] via SUBCUTANEOUS
  Filled 2019-07-15: qty 10

## 2019-07-15 MED ORDER — METOCLOPRAMIDE HCL 5 MG/ML IJ SOLN
5.0000 mg | Freq: Three times a day (TID) | INTRAMUSCULAR | Status: AC
  Administered 2019-07-15 – 2019-07-16 (×3): 5 mg via INTRAVENOUS
  Filled 2019-07-15 (×3): qty 2

## 2019-07-15 MED ORDER — INSULIN NPH (HUMAN) (ISOPHANE) 100 UNIT/ML ~~LOC~~ SUSP: 10.0000 [IU] | Freq: Two times a day (BID) | SUBCUTANEOUS | Status: DC

## 2019-07-15 NOTE — Progress Notes (Signed)
Occupational Therapy Treatment Patient Details Name: Barbara Thomas MRN: [id number] DOB: [birth year] Today's Date: 07/15/2019    History of present illness 69 y.o. F admitted on 07/07/2019 with acute IVC filter occlusion, BLE DVT. Pt s/p mechanical thrombectomy of IVC, illiac, and femoral veins bilaterally with IVC filter removal performed  07/08/2019. PMHx OSA on CPAP; HLD; DM, h/o DVT/PE, cancer, arthritis   OT comments  Pt making good progress towards OT goals this session. Session focus on functional mobility with RW and shower transfer to 3n1. Overall, pt requires MIN A - MIN guard for community level functional mobility with RW. Pt completed simulated shower transfer to walkin shower to 3n1 with MIN A. Pts dtr working on being able to stay with her if she can get her covid  vaccine here in [place] as she is currently scheduled to receive her second dose in [place] and would need to return there on Thursday, otherwise husband is available to assist pt as needed. Updated DC and DME recs as indicated below and collaborated with OTR on changes to POC. Will continue to follow acutely per POC.    Follow Up Recommendations  Home health OT;Supervision/Assistance - 24 hour    Equipment Recommendations  None recommended by OT;Other (comment)(DME needs are met)    Recommendations for Other Services      Precautions / Restrictions Precautions Precautions: None Restrictions Weight Bearing Restrictions: No       Mobility Bed Mobility Overal bed mobility: Modified Independent Bed Mobility: Sit to Supine     Supine to sit: Modified independent (Device/Increase time);HOB elevated Sit to supine: Modified independent (Device/Increase time);HOB elevated   General bed mobility comments: Incr time d/t pain  Transfers Overall transfer level: Needs assistance Equipment used: Rolling walker (2 wheeled) Transfers: Sit to/from Stand Sit to Stand: Min assist         General transfer comment:  Assist to bring hips up     Balance Overall balance assessment: Needs assistance Sitting-balance support: Feet supported;No upper extremity supported Sitting balance-Leahy Scale: Fair     Standing balance support: Bilateral upper extremity supported Standing balance-Leahy Scale: Poor Standing balance comment: walker and supervision for static standing                           ADL either performed or assessed with clinical judgement   ADL Overall ADL's : Needs assistance/impaired                     Lower Body Dressing: Minimal assistance;Sitting/lateral leans Lower Body Dressing Details (indicate cue type and reason): reports ABD pain when reaching to feet; MINA to pull socks up from sitting in recliner. Pt reports reacher at home and familar with sock aid although pt reports husband will likely help with LB dressing at home Toilet Transfer: Min guard;Ambulation;RW Toilet Transfer Details (indicate cue type and reason): simulated via functional mobility with RW and minguard for safety     Tub/ Shower Transfer: Minimal assistance;Ambulation;3 in 1;Rolling walker Tub/Shower Transfer Details (indicate cue type and reason): MIN A for simulated shower transfer to 3n1; [place] for balance and overall technique Functional mobility during ADLs: Min guard;Rolling walker General ADL Comments: pt continues to present with ABD pain, generalized weakness and nausea limting pts ability to participate in ADLs. session focus on shower transfer to walkin shower and functional mobility with RW. pt complete household distance functional mobility with RW and min guard assist. pt daughter working  on being able to stay with pt ( dtr may have to return thursday to recieve 2nd covid shot- working on logistics of that currently). education on getting up once an hour at home increase blood flow and maintain functional independence     Vision       Perception     Praxis      Cognition  Arousal/Alertness: Awake/alert Behavior During Therapy: WFL for tasks assessed/performed Overall Cognitive Status: Within Functional Limits for tasks assessed                                          Exercises     Shoulder Instructions       General Comments HR increase to 135 bpm during functional mobility    Pertinent Vitals/ Pain       Pain Assessment: Faces Faces Pain Scale: Hurts a little bit Pain Location: calves Pain Descriptors / Indicators: Grimacing;Tingling;Sore Pain Intervention(s): Monitored during session  Home Living                                          Prior Functioning/Environment              Frequency  Min 2X/week        Progress Toward Goals  OT Goals(current goals can now be found in the care plan section)  Progress towards OT goals: Progressing toward goals  Acute Rehab OT Goals Patient Stated Goal: Return home OT Goal Formulation: With patient Time For Goal Achievement: 07/27/19 Potential to Achieve Goals: Good  Plan Discharge plan needs to be updated    Co-evaluation                 AM-PAC OT "6 Clicks" Daily Activity     Outcome Measure   Help from another person eating meals?: None Help from another person taking care of personal grooming?: None Help from another person toileting, which includes using toliet, bedpan, or urinal?: A Little Help from another person bathing (including washing, rinsing, drying)?: A Little Help from another person to put on and taking off regular upper body clothing?: None Help from another person to put on and taking off regular lower body clothing?: A Little 6 Click Score: 21    End of Session Equipment Utilized During Treatment: Rolling walker;Other (comment)(3n1)  OT Visit Diagnosis: Unsteadiness on feet (R26.81);Muscle weakness (generalized) (M62.81)   Activity Tolerance Patient tolerated treatment well   Patient Left in bed;with call  bell/phone within reach;with bed alarm set;with family/visitor present   Nurse Communication          Time: 1435-1501 OT Time Calculation (min): 26 min  Charges: OT General Charges $OT Visit: 1 Visit OT Treatments $Self Care/Home Management : 23-37 mins   }Marquavious Nazar Clois Dupes., COTA/L Acute Rehabilitation Services [phone] [place] 07/15/2019, 4:08 PM

## 2019-07-15 NOTE — Care Management (Signed)
Per Reather Converse W/[facility] Help Desk PH# [phone]  Co-pay amount for Levemir Flexpen 10 unit daily for a 30 day supply $365.00.  Co-pay amount for Levemir Flexpen 10 unit daily for a 90 day supply $435.00 (Mail order).   No PA required Deductible not met Tier 3  [facility]ail Pharmacy [facility]

## 2019-07-15 NOTE — Progress Notes (Signed)
Inpatient Rehab Admissions Coordinator:   Met with patient and daughter at bedside to discuss potential CIR admit. Pt. Ambulating well with therapy and recs have been changed to home with home health. Pt. And daughter in agreement to discharge home. CIR will sign off.   Clemens Catholic, [place], [place] Admissions Coordinator  [phone] ([place]) [phone] (office)

## 2019-07-15 NOTE — Progress Notes (Addendum)
Progress Note    07/15/2019 7:17 AM 6 Days Post-Op  Subjective:  Still has complaints of nausea; says she feels full and doesn't want to eat.  Says she did not walk yesterday b/c her legs hurt.  Sat on the side of the bed.  Complains of pain on the right side of the abdomen.    Afebrile HR 80's-100's NSR [id number] systolic 0000000 RA  Vitals:   07/14/19 2337 07/15/19 0429  BP: 124/61 (!) 135/56  Pulse: 93 97  Resp: 19 20  Temp: 98.3 F (36.8 C) 98.5 F (36.9 C)  SpO2: 94% 96%    Physical Exam: Cardiac:  regular Lungs:  Non labored Extremities:  Palpable DP pulses bilaterally Abdomen:  Tenderness to right side of abdomen; +BS present; +BM yesterday; +flatus  CBC    Component Value Date/Time   WBC 9.0 07/15/2019 0312   RBC 3.76 (L) 07/15/2019 0312   HGB 10.8 (L) 07/15/2019 0312   HGB 14.1 03/13/2013 1028   HCT 33.8 (L) 07/15/2019 0312   HCT 43.5 03/13/2013 1028   PLT 218 07/15/2019 0312   PLT 225 03/13/2013 1028   MCV 89.9 07/15/2019 0312   MCV 87 03/13/2013 1028   MCH 28.7 07/15/2019 0312   MCHC 32.0 07/15/2019 0312   RDW 18.1 (H) 07/15/2019 0312   RDW 14.2 03/13/2013 1028   LYMPHSABS 1.8 07/07/2019 0834   LYMPHSABS 1.3 03/13/2013 1028   MONOABS 0.7 07/07/2019 0834   EOSABS 0.1 07/07/2019 0834   EOSABS 0.2 03/13/2013 1028   BASOSABS 0.1 07/07/2019 0834   BASOSABS 0.0 03/13/2013 1028    BMET    Component Value Date/Time   NA 135 07/15/2019 0312   K 4.2 07/15/2019 0312   CL 102 07/15/2019 0312   CO2 24 07/15/2019 0312   GLUCOSE 166 (H) 07/15/2019 0312   BUN 8 07/15/2019 0312   CREATININE 0.69 07/15/2019 0312   CALCIUM 8.5 (L) 07/15/2019 0312   GFRNONAA >60 07/15/2019 0312   GFRAA >60 07/15/2019 0312    INR    Component Value Date/Time   INR 1.1 07/11/2019 0915     Intake/Output Summary (Last 24 hours) at 07/15/2019 0717 Last data filed at 07/15/2019 0428 Gross per 24 hour  Intake 440 ml  Output 2000 ml  Net -1560 ml     Assessment:  69  y.o. female is s/p:  bilateral lower extremity thrombolysis through popliteal vein access and subsequent angiovacwith retrieval of thrombus from her vena cava and removal of old vena cava filter.  6 Days Post-Op  Plan: -pt continues to be quite nauseated this am; she is passing flatus and had BM yesterday; abdominal film on 5/1 was negative.  Will d/w Dr. Donzetta Matters. -acute blood loss anemia is improving -legs still painful to stand-did not ambulate yesterday due to this.  She does have compression socks on  -DVT prophylaxis:  Heparin gtt -husband bought pt a lift chair-will consult CM to determine how to file this with insurance as well as disposition needs.    Leontine Locket, PA-C [facility] [phone] 07/15/2019 7:17 AM  I agree with the above.  I have seen and examined the patient.  She did very well with ambulation today.  She continues to have nausea which is likely secondary to a compressive effect from her RP hematoma.  I suspect this will resolve soon.  Given her diabetes, the addition of Reglan may help as she may also have a component of gastroparesis.  If she  continues to improve, she may be ready for discharge soon.  She will be converted to Xarelto prior to discharge.  Heparin will continue at theraputic rate until then.  Her Hb remains stable and is improving.  Annamarie Major

## 2019-07-15 NOTE — Progress Notes (Signed)
Patient resting on home CPAP machine at this time. Nasal pillows, home setting on 7.0 cm H20, 21% Fio2.

## 2019-07-15 NOTE — Progress Notes (Signed)
ANTICOAGULATION CONSULT NOTE - Follow Up Consult  Pharmacy Consult for Heparin Indication: DVT  Allergies  Allergen Reactions  . Penicillins Rash  . Morphine And Related Nausea And Vomiting    Patient Measurements: Height: 5\' 11"  (180.3 cm) Weight: 117.3 kg (258 lb 9.6 oz) IBW/kg (Calculated) : 70.8 Heparin Dosing Weight: 92.4 kg  Vital Signs: Temp: 98.3 F (36.8 C) (05/03 1105) Temp Source: Oral (05/03 1105) BP: 124/59 (05/03 1105) Pulse Rate: 88 (05/03 1105)  Labs: Recent Labs    07/13/19 0246 07/13/19 1556 07/14/19 0030 07/14/19 0030 07/14/19 0734 07/14/19 1403 07/15/19 0312  HGB 9.3*  --  10.2*  --   --   --  10.8*  HCT 28.1*  --  31.3*  --   --   --  33.8*  PLT 121*  --  162  --   --   --  218  HEPARINUNFRC <0.10*   < > 0.45   < > 0.33 0.33 0.41  CREATININE 0.67  --  0.68  --   --   --  0.69   < > = values in this interval not displayed.    Estimated Creatinine Clearance: 96.3 mL/min (by C-G formula based on SCr of 0.69 mg/dL).   Assessment:  69 year old female s/p thrombolysis and mechanical thrombectomy of IVC, iliac, and femoral viens bilaterally with IVC filter removal and subsequent retroperitoneal hematoma on 4/27. Patient received PRBCs with improvement in Hgb. Pharmacy consulted to help dose heparin. Initially on low dose   Initially targeting low heparin levels of 0.2-0.3 but increased to low therapeutic goal (0.3-0.5) on 5/1. No heparin boluses.   Heparin level remains therapeutic (0.41) on 1100 units/hr. No bleeding reported. CBC now stable.    Goal of Therapy:  Heparin level 0.3-0.5 units/ml Monitor platelets by anticoagulation protocol: Yes   Plan:   Continue heparin drip at 1100 units/hr.  Daily heparin level and CBC.  Noted plan for conversion to oral anticoagulation closer to discharge.  Monitor for any bleeding.  Arty Baumgartner, [place] Phone: [phone] 07/15/2019,11:52 AM

## 2019-07-15 NOTE — Progress Notes (Signed)
Barbara Thomas  [id number] DOB: 1950-07-12 DOA: 07/07/2019 PCP: Ann Held, DO    Brief Narrative:  69 year old with a history of OSA on CPAP, HLD, DM, and prior DVT/PE who presented with right lower extremity pain and swelling from the knee down to the foot.  She drove to [place] for a family event after which she began to experience this pain and swelling.  She reports her prior clotting issues were in 2011 and associated with a broken ankle.  She states she was previously on blood thinners for period of 4 to 5 years with Dr. Marin Olp took her off of them.  Subjective: Complains of ongoing unrelenting nausea, right abdominal/flank pain, and constipation.  Denies chest pain or shortness of breath.  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 Status post placement bilateral lower extremity thrombolysis through popliteal vein access and subsequent angio VAC with retrieval of thrombus from vena cava and removal of old vena cava filter - complicated by retroperitoneal bleeding requiring pressure support and blood transfusion -vascular surgery following and directing care of this issue  Acute blood loss anemia Status post transfusion - hemoglobin stable/improving presently  DM 2 -uncontrolled - previously untreated A1c 11.2 - insulin started this admission -CBG variable but reasonably controlled at present -transition to intended home regimen and continue diabetic education  Hypokalemia Corrected with supplementation  Severe constipation Trial of scheduled Reglan to address constipation and nausea  Obesity - Body mass index is 36.07 kg/m.   DVT prophylaxis: Heparin drip Code Status: FULL CODE Family Communication: Spoke with patient and daughter at bedside Status is: Inpatient  Remains inpatient appropriate because:Ongoing active pain requiring inpatient pain management   Dispo: The patient is from: Home              Anticipated  d/c is to: Home              Anticipated d/c date is: 1 day              Patient currently is not medically stable to d/c.   Consultants:  Vascular surgery  Objective: Blood pressure (!) 149/63, pulse (!) 107, temperature 98.3 F (36.8 C), temperature source Oral, resp. rate 19, height 5\' 11"  (1.803 m), weight 117.3 kg, SpO2 96 %.  Intake/Output Summary (Last 24 hours) at 07/15/2019 1036 Last data filed at 07/15/2019 1000 Gross per 24 hour  Intake 320 ml  Output 2100 ml  Net -1780 ml   Filed Weights   07/07/19 0150 07/07/19 1612 07/12/19 0600  Weight: 101.6 kg 103.5 kg 117.3 kg    Examination: General: No acute respiratory distress Lungs: Clear to auscultation bilaterally without wheezes or crackles Cardiovascular: Regular rate and rhythm without murmur gallop or rub normal S1 and S2 Abdomen: Tender diffusely but worse on the right, no rebound, soft, bowel sounds positive Extremities: No significant cyanosis, clubbing, or edema bilateral lower extremities  CBC: Recent Labs  Lab 07/13/19 0246 07/14/19 0030 07/15/19 0312  WBC 6.8 9.0 9.0  HGB 9.3* 10.2* 10.8*  HCT 28.1* 31.3* 33.8*  MCV 86.5 89.4 89.9  PLT 121* 162 [id number]   Basic Metabolic Panel: Recent Labs  Lab 07/13/19 0246 07/14/19 0030 07/15/19 0312  NA 139 137 135  K 3.2* 3.8 4.2  CL 105 104 102  CO2 26 24 24   GLUCOSE 179* 169* 166*  BUN 7* 6* 8  CREATININE 0.67 0.68 0.69  CALCIUM 8.3* 8.4* 8.5*  MG 1.9 1.9  2.0   GFR: Estimated Creatinine Clearance: 96.3 mL/min (by C-G formula based on SCr of 0.69 mg/dL).  Liver Function Tests: No results for input(s): AST, ALT, ALKPHOS, BILITOT, PROT, ALBUMIN in the last 168 hours. No results for input(s): LIPASE, AMYLASE in the last 168 hours. No results for input(s): AMMONIA in the last 168 hours.  Coagulation Profile: Recent Labs  Lab 07/11/19 0915  INR 1.1    HbA1C: Hgb A1c MFr Bld  Date/Time Value Ref Range Status  07/07/2019 08:34 AM 11.2 (H) 4.8 - 5.6  % Final    Comment:    (NOTE) Pre diabetes:          5.7%-6.4% Diabetes:              >6.4% Glycemic control for   <7.0% adults with diabetes   06/21/2018 09:14 AM 7.2 (H) 4.6 - 6.5 % Final    Comment:    Glycemic Control Guidelines for People with Diabetes:Non Diabetic:  <6%Goal of Therapy: <7%Additional Action Suggested:  >8%     CBG: Recent Labs  Lab 07/14/19 0834 07/14/19 1131 07/14/19 1646 07/14/19 2124 07/15/19 0638  GLUCAP 140* 211* 193* 225* 137*    Recent Results (from the past 240 hour(s))  SARS CORONAVIRUS 2 (TAT 6-24 HRS) Nasopharyngeal Nasopharyngeal Swab     Status: None   Collection Time: 07/07/19  3:33 PM   Specimen: Nasopharyngeal Swab  Result Value Ref Range Status   SARS Coronavirus 2 NEGATIVE NEGATIVE Final    Comment: (NOTE) SARS-CoV-2 target nucleic acids are NOT DETECTED. The SARS-CoV-2 RNA is generally detectable in upper and lower respiratory specimens during the acute phase of infection. Negative results do not preclude SARS-CoV-2 infection, do not rule out co-infections with other pathogens, and should not be used as the sole basis for treatment or other patient management decisions. Negative results must be combined with clinical observations, patient history, and epidemiological information. The expected result is Negative. Fact Sheet for Patients: SugarRoll.be Fact Sheet for Healthcare Providers: https://www.woods-mathews.com/ This test is not yet approved or cleared by the [place] FDA and  has been authorized for detection and/or diagnosis of SARS-CoV-2 by FDA under an Emergency Use Authorization (EUA). This EUA will remain  in effect (meaning this test can be used) for the duration of the COVID-19 declaration under Section 56 4(b)(1) of the Act, 21 U.S.C. section 360bbb-3(b)(1), unless the authorization is terminated or revoked sooner. Performed at [facility], [place] [address]  [place], [place] [address]   MRSA PCR Screening     Status: None   Collection Time: 07/08/19  5:40 PM   Specimen: Nasal Mucosa; Nasopharyngeal  Result Value Ref Range Status   MRSA by PCR NEGATIVE NEGATIVE Final    Comment:        The GeneXpert MRSA Assay (FDA approved for NASAL specimens only), is one component of a comprehensive MRSA colonization surveillance program. It is not intended to diagnose MRSA infection nor to guide or monitor treatment for MRSA infections. Performed at [facility], [place] [address] [place],  [address]      Scheduled Meds: . Chlorhexidine Gluconate Cloth  6 each Topical Daily  . feeding supplement (GLUCERNA SHAKE)  237 mL Oral TID BM  . insulin aspart  0-15 Units Subcutaneous TID WC  . insulin aspart  0-5 Units Subcutaneous QHS  . insulin glargine  10 Units Subcutaneous BID  . living well with diabetes book   Does not apply Once  .  montelukast  10 mg Oral QHS  . polyethylene glycol  17 g Oral BID  . scopolamine  1 patch Transdermal Q72H  . senna-docusate  1 tablet Oral BID  . simvastatin  40 mg Oral QHS  . sodium chloride flush  3 mL Intravenous Q12H  . sodium chloride flush  3 mL Intravenous Q12H   Continuous Infusions: . sodium chloride Stopped (07/10/19 1213)  . heparin 1,100 Units/hr (07/13/19 2031)     LOS: 8 days   Cherene Altes, MD [facility] Office  [phone] Pager - Text Page per Amion  If 7PM-7AM, please contact night-coverage per Amion 07/15/2019, 10:36 AM

## 2019-07-15 NOTE — Progress Notes (Signed)
Physical Therapy Treatment Patient Details Name: Barbara Thomas MRN: [id number] DOB: 01-24-1951 Today's Date: 07/15/2019    History of Present Illness 69 y.o. F admitted on 07/07/2019 with acute IVC filter occlusion, BLE DVT. Pt s/p mechanical thrombectomy of IVC, illiac, and femoral veins bilaterally with IVC filter removal performed  07/08/2019. PMHx OSA on CPAP; HLD; DM, h/o DVT/PE, cancer, arthritis    PT Comments    Pt with great improvement with mobility. Feel pt will be able to return home. Pt's daughter from [place] able to stay until Thursday with patient and then needs to return to [place] to get her second [place] shot. She asked if she could get second dose locally and then she could stay longer with her mother.  I emailed that question to the [place] vaccine email and am waiting for reply.   Follow Up Recommendations  Home health PT;Supervision for mobility/OOB     Equipment Recommendations  None recommended by PT    Recommendations for Other Services       Precautions / Restrictions Precautions Precautions: None Restrictions Weight Bearing Restrictions: No    Mobility  Bed Mobility Overal bed mobility: Modified Independent Bed Mobility: Supine to Sit     Supine to sit: Modified independent (Device/Increase time);HOB elevated     General bed mobility comments: Incr time  Transfers Overall transfer level: Needs assistance Equipment used: Rolling walker (2 wheeled) Transfers: Sit to/from Stand Sit to Stand: Min assist         General transfer comment: Assist to bring hips up   Ambulation/Gait Ambulation/Gait assistance: Supervision Gait Distance (Feet): 300 Feet Assistive device: Rolling walker (2 wheeled) Gait Pattern/deviations: Step-through pattern;Decreased stride length Gait velocity: decr Gait velocity interpretation: 1.31 - 2.62 ft/sec, indicative of limited community ambulator General Gait Details: supervision for safety   Stairs              Wheelchair Mobility    Modified Rankin (Stroke Patients Only)       Balance Overall balance assessment: Needs assistance Sitting-balance support: Feet supported;No upper extremity supported Sitting balance-Leahy Scale: Fair     Standing balance support: Bilateral upper extremity supported Standing balance-Leahy Scale: Poor Standing balance comment: walker and supervision for static standing                            Cognition Arousal/Alertness: Awake/alert Behavior During Therapy: WFL for tasks assessed/performed Overall Cognitive Status: Within Functional Limits for tasks assessed                                        Exercises      General Comments General comments (skin integrity, edema, etc.): VSS      Pertinent Vitals/Pain Pain Assessment: Faces Faces Pain Scale: Hurts little more Pain Location: bilateral calves Pain Descriptors / Indicators: Grimacing;Tingling;Sore Pain Intervention(s): Limited activity within patient's tolerance;Monitored during session    Home Living                      Prior Function            PT Goals (current goals can now be found in the care plan section) Acute Rehab PT Goals Patient Stated Goal: Return home Progress towards PT goals: Progressing toward goals    Frequency    Min 3X/week  PT Plan Discharge plan needs to be updated    Co-evaluation              AM-PAC PT "6 Clicks" Mobility   Outcome Measure  Help needed turning from your back to your side while in a flat bed without using bedrails?: A Little Help needed moving from lying on your back to sitting on the side of a flat bed without using bedrails?: A Little Help needed moving to and from a bed to a chair (including a wheelchair)?: A Little Help needed standing up from a chair using your arms (e.g., wheelchair or bedside chair)?: A Little Help needed to walk in hospital room?: A Little Help  needed climbing 3-5 steps with a railing? : A Little 6 Click Score: 18    End of Session Equipment Utilized During Treatment: Gait belt Activity Tolerance: Patient tolerated treatment well Patient left: in chair;with family/visitor present;with call bell/phone within reach Nurse Communication: Mobility status PT Visit Diagnosis: Other abnormalities of gait and mobility (R26.89);Pain Pain - Right/Left: (bilateral ) Pain - part of body: Leg     Time: 1221-1248 PT Time Calculation (min) (ACUTE ONLY): 27 min  Charges:  $Gait Training: 23-37 mins                     [place] Pager [phone] Office [place] 07/15/2019, 3:47 PM

## 2019-07-15 NOTE — Progress Notes (Addendum)
Inpatient Diabetes Program Recommendations  AACE/ADA: New Consensus Statement on Inpatient Glycemic Control (2015)  Target Ranges:  Prepandial:   less than 140 mg/dL      Peak postprandial:   less than 180 mg/dL (1-2 hours)      Critically ill patients:  140 - 180 mg/dL   Lab Results  Component Value Date   GLUCAP 137 (H) 07/15/2019   HGBA1C 11.2 (H) 07/07/2019    Review of Glycemic Control Results for Barbara Thomas, BALLES" (MRN [id number]) as of 07/15/2019 09:52  Ref. Range 07/14/2019 08:34 07/14/2019 11:31 07/14/2019 16:46 07/14/2019 21:24 07/15/2019 06:38  Glucose-Capillary Latest Ref Range: 70 - 99 mg/dL 140 (H) 211 (H) 193 (H) 225 (H) 137 (H)   Diabetes history: DM 2 NOTE: Patient admitted with venous thromboembolism. Noted consult for Diabetes Coordinator for new DM dx. Per chart, patient has never been told she had DM. A1C 6.9% on 11/27/17 and 7.2% on 06/21/18. Initial lab glucose on 07/07/19 was 427 mg/dl and current A1C 11.2% on 07/06/19.  Current orders for Inpatient glycemic control:  Lantus 10 units bid Novolog 0-15 units tid + hs  Inpatient Diabetes Program Recommendations:  Levemir seems preferred by insurance. Benefits check submitted for Levemir flexpen versus vial and syringe. Levemir is still tier 3.   Pt may need 70/30 from [employer] if too expensive. Based on glucose spikes after meals, 70/30 may be a viable option.  Benefits check came through Levemir would still be between $365 (30 days supply)-$435 (90 day supply). Consider ReliOn Novolin (N) NPH flexpen 10 units bid (order # [id number]) at time of d/c.   Will speak with pt on vial and syringe versus insulin pen from [facility].   Spoke with pt and daughter at bedside regarding insulin at time of d/c. Pt still experiencing nausea and not able to have good PO intake at this time. Would recommend NPH insulin pen at time of d/c as pt thinks its easier to operate. Would hold off on metformin as it potentially has GI side  effects.  Discussed the type of insulin pt may be on at time of d/c and when to take it. Showed pt and daughter the insulin pen and walked through how to operate it. Also discussed s/s of hypoglycemia and treatment. Pt feels overwhelmed however. Pt daughter will be home a few days and will help. Also gave my phone number for extra support if needed at home.   D/c: ReliOn Novolin (N) NPH flexpen 10 units bid (order # [id number]) Insulin pen needles order # ([id number]) Blood glucose meter kit order # [id number]  Thanks,  Tama Headings RN, MSN, BC-ADM Inpatient Diabetes Coordinator Team Pager [phone] (8a-5p)

## 2019-07-15 NOTE — TOC Progression Note (Addendum)
Transition of Care (TOC) - Progression Note  Marvetta Gibbons RN, BSN Transitions of Care Unit 4E- RN Case Manager [phone]   Patient Details  Name: Barbara Thomas MRN: [id number] Date of Birth: 05-06-1950  Transition of Care [facility]) CM/SW Contact  Dahlia Client, Romeo Rabon, RN Phone Number: 07/15/2019, 2:42 PM  Clinical Narrative:    Spoke with Vascular PA regarding lift chair- DME order provided- printed order given to pt/daughter and explained how she would need to submit to insurance along with receipt for lift chair in order to get reimbursed for chair- also explained that Medicare only covers the "mechanical" part of the chair- pt and daughter voiced understanding.   TOC to continue to follow for transition of care needs      Expected Discharge Plan and Services                                                 Social Determinants of Health (SDOH) Interventions    Readmission Risk Interventions No flowsheet data found.

## 2019-07-16 LAB — CBC
HCT: 34.4 % — ABNORMAL LOW (ref 36.0–46.0)
Hemoglobin: 11.1 g/dL — ABNORMAL LOW (ref 12.0–15.0)
MCH: 29.3 pg (ref 26.0–34.0)
MCHC: 32.3 g/dL (ref 30.0–36.0)
MCV: 90.8 fL (ref 80.0–100.0)
Platelets: 250 10*3/uL (ref 150–400)
RBC: 3.79 MIL/uL — ABNORMAL LOW (ref 3.87–5.11)
RDW: 17.6 % — ABNORMAL HIGH (ref 11.5–15.5)
WBC: 9.7 10*3/uL (ref 4.0–10.5)
nRBC: 0 % (ref 0.0–0.2)

## 2019-07-16 LAB — GLUCOSE, CAPILLARY
Glucose-Capillary: 131 mg/dL — ABNORMAL HIGH (ref 70–99)
Glucose-Capillary: 218 mg/dL — ABNORMAL HIGH (ref 70–99)

## 2019-07-16 LAB — HEPARIN LEVEL (UNFRACTIONATED): Heparin Unfractionated: 0.17 IU/mL — ABNORMAL LOW (ref 0.30–0.70)

## 2019-07-16 MED ORDER — OXYCODONE-ACETAMINOPHEN 5-325 MG PO TABS: 1.0000 | ORAL_TABLET | ORAL | 0 refills | Status: AC | PRN

## 2019-07-16 MED ORDER — PEN NEEDLES 32G X 5 MM MISC: 1 refills | Status: DC

## 2019-07-16 MED ORDER — NOVOLIN N FLEXPEN RELION 100 UNIT/ML ~~LOC~~ SUPN: PEN_INJECTOR | SUBCUTANEOUS | 1 refills | Status: DC

## 2019-07-16 MED ORDER — RIVAROXABAN 20 MG PO TABS: 20.0000 mg | ORAL_TABLET | Freq: Every day | ORAL | Status: DC

## 2019-07-16 MED ORDER — BLOOD GLUCOSE MONITOR KIT: PACK | 0 refills | Status: AC

## 2019-07-16 MED ORDER — INSULIN NPH (HUMAN) (ISOPHANE) 100 UNIT/ML ~~LOC~~ SUSP: 12.0000 [IU] | Freq: Two times a day (BID) | SUBCUTANEOUS | 0 refills | Status: DC

## 2019-07-16 MED ORDER — RIVAROXABAN (XARELTO) VTE STARTER PACK (15 & 20 MG)
ORAL_TABLET | ORAL | 0 refills | Status: DC
Start: 2019-07-16 — End: 2019-08-19

## 2019-07-16 MED ORDER — ONDANSETRON HCL 4 MG PO TABS: 4.0000 mg | ORAL_TABLET | Freq: Every day | ORAL | 0 refills | Status: DC | PRN

## 2019-07-16 MED ORDER — RIVAROXABAN 15 MG PO TABS
15.0000 mg | ORAL_TABLET | Freq: Two times a day (BID) | ORAL | Status: DC
  Administered 2019-07-16: 15 mg via ORAL
  Filled 2019-07-16: qty 1

## 2019-07-16 MED FILL — XARELTO STARTER PACK: 15 & 20 | 30 days supply | Qty: 51 | Fill #0

## 2019-07-16 NOTE — Progress Notes (Addendum)
ANTICOAGULATION CONSULT NOTE - Initial Consult  Pharmacy Consult for heparin>> rivaroxaban Indication: DVT   Patient Measurements: Height: 5\' 11"  (180.3 cm) Weight: 117.3 kg (258 lb 9.6 oz) IBW/kg (Calculated) : 70.8  Vital Signs: Temp: 98.2 F (36.8 C) (05/04 0735) Temp Source: Oral (05/04 0735) BP: 143/63 (05/04 0735) Pulse Rate: 98 (05/04 0735)  Labs: Recent Labs    07/14/19 0030 07/14/19 0030 07/14/19 0734 07/14/19 1403 07/15/19 0312 07/16/19 0339  HGB 10.2*   < >  --   --  10.8* 11.1*  HCT 31.3*  --   --   --  33.8* 34.4*  PLT 162  --   --   --  218 250  HEPARINUNFRC 0.45  --    < > 0.33 0.41 0.17*  CREATININE 0.68  --   --   --  0.69  --    < > = values in this interval not displayed.    Estimated Creatinine Clearance: 96.3 mL/min (by C-G formula based on SCr of 0.69 mg/dL).   Assessment: 69 yo W with extensive IVC and femoral iliac thrombus provoked by broken toe and long car ride s/p thrombolytics 4/26 and thrombectomy and removal of IVC filter 4/27. Also with history of PE and BLLE DVTs in 2011 on warfarin until 2015-2016. Patient has subtherapeutic heparin levels 4/27-4/30  due to retroperitoneal hematoma after VVS procedures requiring 4 units RBC this admission. On 5/4, patient lost IV access for heparin and pharmacy was consulted for rivaroxaban.   Patient with good renal function. Will start rivaroxaban 15mg  BID for 11 more days then decrease to 20mg  daily.     Plan:  Rivaroxaban 15mg  BID until 5/14 then decrease to 20mg  daily on 5/15 Monitor CBC and signs/symptoms of bleeding  Education done, AVS in   Benetta Spar, PharmD, BCPS, [facility] Clinical Pharmacist  Please check AMION for all [place] phone numbers After 10:00 PM, call [place] [phone]

## 2019-07-16 NOTE — Progress Notes (Signed)
PIV consult: Arrived to room, vascular MD requested I hold off on starting new IV. Requested RN re-enter consult if plan changes.

## 2019-07-16 NOTE — Care Management Important Message (Signed)
Important Message  Patient Details  Name: Barbara Thomas MRN: [id number] Date of Birth: 28-Dec-1950   Medicare Important Message Given:  Yes     Shelda Altes 07/16/2019, 10:01 AM

## 2019-07-16 NOTE — Plan of Care (Signed)
  Problem: Clinical Measurements: Goal: Ability to maintain clinical measurements within normal limits will improve Outcome: Progressing   Problem: Clinical Measurements: Goal: Cardiovascular complication will be avoided Outcome: Not Progressing   Problem: Nutrition: Goal: Adequate nutrition will be maintained Outcome: Not Progressing

## 2019-07-16 NOTE — Progress Notes (Signed)
Occupational Therapy Treatment Patient Details Name: Barbara Thomas MRN: [id number] DOB: 1950-12-29 Today's Date: 07/16/2019    History of present illness 69 y.o. F admitted on 07/07/2019 with acute IVC filter occlusion, BLE DVT. Pt s/p mechanical thrombectomy of IVC, illiac, and femoral veins bilaterally with IVC filter removal performed  07/08/2019. PMHx OSA on CPAP; HLD; DM, h/o DVT/PE, cancer, arthritis   OT comments  Pt making good/consistent progress with functional goals. Pt sup - min guard A with ADL mobility sing RW and min A with LB ADLs. Educated pt, her husband and daughter on LB ADL A/E for home use (reacher, sock aid, LH bath sponge, TED hose donner). Pt will have assist at home from her husband and daughter visiting from [place]. Pt to possibly  d/c home this afternoon  Follow Up Recommendations  Home health OT;Supervision/Assistance - 24 hour    Equipment Recommendations  Other (comment)(ADL A/E kit)    Recommendations for Other Services      Precautions / Restrictions Precautions Precautions: None       Mobility Bed Mobility               General bed mobility comments: pt in recliner  Transfers Overall transfer level: Needs assistance Equipment used: Rolling walker (2 wheeled) Transfers: Sit to/from Stand Sit to Stand: Supervision;Min guard              Balance Overall balance assessment: Needs assistance Sitting-balance support: Feet supported;No upper extremity supported Sitting balance-Leahy Scale: Fair     Standing balance support: Bilateral upper extremity supported;During functional activity Standing balance-Leahy Scale: Poor                             ADL either performed or assessed with clinical judgement   ADL Overall ADL's : Needs assistance/impaired     Grooming: Wash/dry hands;Wash/dry face;Supervision/safety;Modified independent;Standing;With caregiver independent assisting   Upper Body Bathing: Set up;Sitting;With  caregiver independent assisting Upper Body Bathing Details (indicate cue type and reason): simulated Lower Body Bathing: Moderate assistance;Minimal assistance;Sit to/from stand;With caregiver independent assisting       Lower Body Dressing: Minimal assistance;Sitting/lateral leans;With caregiver independent assisting   Toilet Transfer: Supervision/safety;Ambulation;RW   Toileting- Clothing Manipulation and Hygiene: Supervision/safety;Sit to/from stand;With caregiver independent assisting       Functional mobility during ADLs: Supervision/safety;Rolling walker;Caregiver able to provide necessary level of assistance General ADL Comments: educated pt, her husband and daughter on LB ADL A/E for home use (reacher, sock aid, LH bath sponge, TED hose donner)     Vision Baseline Vision/History: Wears glasses Patient Visual Report: No change from baseline     Perception     Praxis      Cognition Arousal/Alertness: Awake/alert Behavior During Therapy: WFL for tasks assessed/performed Overall Cognitive Status: Within Functional Limits for tasks assessed                                          Exercises     Shoulder Instructions       General Comments      Pertinent Vitals/ Pain       Pain Assessment: 0-10 Pain Score: 6  Pain Location: R great toe Pain Descriptors / Indicators: Throbbing Pain Intervention(s): Monitored during session;Repositioned  Home Living  Prior Functioning/Environment              Frequency  Min 2X/week        Progress Toward Goals  OT Goals(current goals can now be found in the care plan section)  Progress towards OT goals: Progressing toward goals     Plan Discharge plan remains appropriate    Co-evaluation                 AM-PAC OT "6 Clicks" Daily Activity     Outcome Measure   Help from another person eating meals?: None Help from another  person taking care of personal grooming?: None Help from another person toileting, which includes using toliet, bedpan, or urinal?: A Little Help from another person bathing (including washing, rinsing, drying)?: A Little Help from another person to put on and taking off regular upper body clothing?: None Help from another person to put on and taking off regular lower body clothing?: A Little 6 Click Score: 21    End of Session Equipment Utilized During Treatment: Rolling walker;Gait belt(3 in 1)  OT Visit Diagnosis: Unsteadiness on feet (R26.81);Muscle weakness (generalized) (M62.81)   Activity Tolerance Patient tolerated treatment well   Patient Left in chair;with call bell/phone within reach;with family/visitor present   Nurse Communication          Time: 5041-3643 OT Time Calculation (min): 32 min  Charges: OT Treatments $Self Care/Home Management : 8-22 mins $Therapeutic Activity: 8-22 mins     Britt Bottom 07/16/2019, 1:28 PM

## 2019-07-16 NOTE — Progress Notes (Signed)
ANTICOAGULATION CONSULT NOTE - Follow Up Consult  Pharmacy Consult for heparin Indication: DVT  Labs: Recent Labs    07/14/19 0030 07/14/19 0030 07/14/19 0734 07/14/19 1403 07/15/19 0312 07/16/19 0339  HGB 10.2*   < >  --   --  10.8* 11.1*  HCT 31.3*  --   --   --  33.8* 34.4*  PLT 162  --   --   --  218 250  HEPARINUNFRC 0.45  --    < > 0.33 0.41 0.17*  CREATININE 0.68  --   --   --  0.69  --    < > = values in this interval not displayed.    Assessment: 69yo female subtherapeutic on heparin after several levels at goal; no gtt issues/pauses or signs of bleeding per RN.  Goal of Therapy:  Heparin level 0.3-0.5 units/ml   Plan:  Will increase heparin gtt conservatively by 1 unit/kgABW/hr to 1200 units/hr and check level in 6 hours.    Wynona Neat, PharmD, BCPS  07/16/2019,5:04 AM

## 2019-07-16 NOTE — TOC Transition Note (Signed)
Transition of Care [facility]) - CM/SW Discharge Note Marvetta Gibbons RN, BSN Transitions of Care Unit 4E- RN Case Manager [phone]    Patient Details  Name: Barbara Thomas MRN: [id number] Date of Birth: 09/08/1950  Transition of Care [name]) CM/SW Contact:  Dawayne Patricia, RN Phone Number: 07/16/2019, 12:58 PM   Clinical Narrative:    Pt stable for transition home today with family, orders placed for HHPT/OT, CM spoke with pt/spouse and daughter at the bedside- list provided for choice Per CMS guidelines from medicare.gov website with star ratings (copy placed in shadow chart)- per their choice they would like to use [name] as first choice and if unable to accept [name] as second choice. Per pt she has all needed DME at home, order for lift chair provided to pt yesterday for reimbursement needs.  Discussed Xarelto needs- pt has not met her deductible and may have to pay out of pocket cost (which was provided) until deductible met- provided pt/spouse with info on [facility], TOC pharmacy to fill meds for discharge and will provide pt with 30 day free of Xarelto with drug coupon.  Call made to [name] with [name] for HHPT/OT referral- Butch Penny able to accept referral for needed [name] services. [name] to f/u post discharge.    Final next level of care: Home w Home Health Services Barriers to Discharge: No Barriers Identified   Patient Goals and CMS Choice Patient states their goals for this hospitalization and ongoing recovery are:: "to get back to normal and live life like I want to" CMS Medicare.gov Compare Post Acute Care list provided to:: Patient Choice offered to / list presented to : Patient, Spouse, Adult Children  Discharge Placement               Home with [name]        Discharge Plan and Services   Discharge Planning Services: CM Consult Post Acute Care Choice: Home Health          DME Arranged: N/A DME Agency: NA       HH Arranged: PT, OT [place] Agency: [place]  ([facility]) Date HH Agency Contacted: 07/16/19 Time [place]: 1257 Representative spoke with at [place]: [place] ([place]) Interventions     Readmission Risk Interventions Readmission Risk Prevention Plan 07/16/2019  Post Dischage Appt Complete  Medication Screening Complete  Transportation Screening Complete  Some recent data might be hidden

## 2019-07-16 NOTE — Discharge Instructions (Signed)
Dietitian email: [email or web address]  Carbohydrate Counting For People With Diabetes  Foods with carbohydrates make your blood glucose level go up. Learning how to count carbohydrates can help you control your blood glucose levels. First, identify the foods you eat that contain carbohydrates. Then, using the Foods with Carbohydrates chart, determine about how much carbohydrates are in your meals and snacks. Make sure you are eating foods with fiber, protein, and healthy fat along with your carbohydrate foods. Foods with Carbohydrates The following table shows carbohydrate foods that have about 15 grams of carbohydrate each. Using measuring cups, spoons, or a food scale when you first begin learning about carbohydrate counting can help you learn about the portion sizes you typically eat. The following foods have 15 grams carbohydrate each:  Grains . 1 slice bread (1 ounce)  . 1 small tortilla (6-inch size)  .  large bagel (1 ounce)  . 1/3 cup pasta or rice (cooked)  .  hamburger or hot dog bun ( ounce)  .  cup cooked cereal  .  to  cup ready-to-eat cereal  . 2 taco shells (5-inch size) Fruit . 1 small fresh fruit ( to 1 cup)  .  medium banana  . 17 small grapes (3 ounces)  . 1 cup melon or berries  .  cup canned or frozen fruit  . 2 tablespoons dried fruit (blueberries, cherries, cranberries, raisins)  .  cup unsweetened fruit juice  Starchy Vegetables .  cup cooked beans, peas, corn, potatoes/sweet potatoes  .  large baked potato (3 ounces)  . 1 cup acorn or butternut squash  Snack Foods . 3 to 6 crackers  . 8 potato chips or 13 tortilla chips ( ounce to 1 ounce)  . 3 cups popped popcorn  Dairy . 3/4 cup (6 ounces) nonfat plain yogurt, or yogurt with sugar-free sweetener  . 1 cup milk  . 1 cup plain rice, soy, coconut or flavored almond milk Sweets and Desserts .  cup ice cream or frozen yogurt  . 1 tablespoon jam, jelly, pancake syrup, table sugar, or  honey  . 2 tablespoons light pancake syrup  . 1 inch square of frosted cake or 2 inch square of unfrosted cake  . 2 small cookies (2/3 ounce each) or  large cookie  Sometimes you'll have to estimate carbohydrate amounts if you don't know the exact recipe. One cup of mixed foods like soups can have 1 to 2 carbohydrate servings, while some casseroles might have 2 or more servings of carbohydrate. Foods that have less than 20 calories in each serving can be counted as "free" foods. Count 1 cup raw vegetables, or  cup cooked non-starchy vegetables as "free" foods. If you eat 3 or more servings at one meal, then count them as 1 carbohydrate serving.  Foods without Carbohydrates  Not all foods contain carbohydrates. Meat, some dairy, fats, non-starchy vegetables, and many beverages don't contain carbohydrate. So when you count carbohydrates, you can generally exclude chicken, pork, beef, fish, seafood, eggs, tofu, cheese, butter, sour cream, avocado, nuts, seeds, olives, mayonnaise, water, black coffee, unsweetened tea, and zero-calorie drinks. Vegetables with no or low carbohydrate include green beans, cauliflower, tomatoes, and onions. How much carbohydrate should I eat at each meal?  Carbohydrate counting can help you plan your meals and manage your weight. Following are some starting points for carbohydrate intake at each meal. Work with your registered dietitian nutritionist to find the best range that works for your blood glucose and weight.  To Lose Weight To Maintain Weight  Women 2 - 3 carb servings 3 - 4 carb servings  Men 3 - 4 carb servings 4 - 5 carb servings  Checking your blood glucose after meals will help you know if you need to adjust the timing, type, or number of carbohydrate servings in your meal plan. Achieve and keep a healthy body weight by balancing your food intake and physical activity.  Tips How should I plan my meals?  Plan for half the food on your plate to include  non-starchy vegetables, like salad greens, broccoli, or carrots. Try to eat 3 to 5 servings of non-starchy vegetables every day. Have a protein food at each meal. Protein foods include chicken, fish, meat, eggs, or beans (note that beans contain carbohydrate). These two food groups (non-starchy vegetables and proteins) are low in carbohydrate. If you fill up your plate with these foods, you will eat less carbohydrate but still fill up your stomach. Try to limit your carbohydrate portion to  of the plate.  What fats are healthiest to eat?  Diabetes increases risk for heart disease. To help protect your heart, eat more healthy fats, such as olive oil, nuts, and avocado. Eat less saturated fats like butter, cream, and high-fat meats, like bacon and sausage. Avoid trans fats, which are in all foods that list "partially hydrogenated oil" as an ingredient. What should I drink?  Choose drinks that are not sweetened with sugar. The healthiest choices are water, carbonated or seltzer waters, and tea and coffee without added sugars.  Sweet drinks will make your blood glucose go up very quickly. One serving of soda or energy drink is  cup. It is best to drink these beverages only if your blood glucose is low.  Artificially sweetened, or diet drinks, typically do not increase your blood glucose if they have zero calories in them. Read labels of beverages, as some diet drinks do have carbohydrate and will raise your blood glucose. Label Reading Tips Read Nutrition Facts labels to find out how many grams of carbohydrate are in a food you want to eat. Don't forget: sometimes serving sizes on the label aren't the same as how much food you are going to eat, so you may need to calculate how much carbohydrate is in the food you are serving yourself.   Carbohydrate Counting for People with Diabetes Sample 1-Day Menu  Breakfast  cup yogurt, low fat, low sugar (1 carbohydrate serving)   cup cereal, ready-to-eat,  unsweetened (1 carbohydrate serving)  1 cup strawberries (1 carbohydrate serving)   cup almonds ( carbohydrate serving)  Lunch 1, 5 ounce can chunk light tuna  2 ounces cheese, low fat cheddar  6 whole wheat crackers (1 carbohydrate serving)  1 small apple (1 carbohydrate servings)   cup carrots ( carbohydrate serving)   cup snap peas  1 cup 1% milk (1 carbohydrate serving)   Evening Meal Stir fry made with: 3 ounces chicken  1 cup brown rice (3 carbohydrate servings)   cup broccoli ( carbohydrate serving)   cup green beans   cup onions  1 tablespoon olive oil  2 tablespoons teriyaki sauce ( carbohydrate serving)  Evening Snack 1 extra small banana (1 carbohydrate serving)  1 tablespoon peanut butter   Carbohydrate Counting for People with Diabetes Vegan Sample 1-Day Menu  Breakfast 1 cup cooked oatmeal (2 carbohydrate servings)   cup blueberries (1 carbohydrate serving)  2 tablespoons flaxseeds  1 cup soymilk fortified with calcium and  vitamin D  1 cup coffee  Lunch 2 slices whole wheat bread (2 carbohydrate servings)   cup baked tofu   cup lettuce  2 slices tomato  2 slices avocado   cup baby carrots ( carbohydrate serving)  1 orange (1 carbohydrate serving)  1 cup soymilk fortified with calcium and vitamin D   Evening Meal Burrito made with: 1 6-inch corn tortilla (1 carbohydrate serving)  1 cup refried vegetarian beans (2 carbohydrate servings)   cup chopped tomatoes   cup lettuce   cup salsa  1/3 cup brown rice (1 carbohydrate serving)  1 tablespoon olive oil for rice   cup zucchini   Evening Snack 6 small whole grain crackers (1 carbohydrate serving)  2 apricots ( carbohydrate serving)   cup unsalted peanuts ( carbohydrate serving)    Carbohydrate Counting for People with Diabetes Vegetarian (Lacto-Ovo) Sample 1-Day Menu  Breakfast 1 cup cooked oatmeal (2 carbohydrate servings)   cup blueberries (1 carbohydrate serving)  2 tablespoons  flaxseeds  1 egg  1 cup 1% milk (1 carbohydrate serving)  1 cup coffee  Lunch 2 slices whole wheat bread (2 carbohydrate servings)  2 ounces low-fat cheese   cup lettuce  2 slices tomato  2 slices avocado   cup baby carrots ( carbohydrate serving)  1 orange (1 carbohydrate serving)  1 cup unsweetened tea  Evening Meal Burrito made with: 1 6-inch corn tortilla (1 carbohydrate serving)   cup refried vegetarian beans (1 carbohydrate serving)   cup tomatoes   cup lettuce   cup salsa  1/3 cup brown rice (1 carbohydrate serving)  1 tablespoon olive oil for rice   cup zucchini  1 cup 1% milk (1 carbohydrate serving)  Evening Snack 6 small whole grain crackers (1 carbohydrate serving)  2 apricots ( carbohydrate serving)   cup unsalted peanuts ( carbohydrate serving)    Copyright 2020  Academy of [employer]. All rights reserved.  Using Nutrition Labels: Carbohydrate  . Serving Size  . Look at the serving size. All the information on the label is based on this portion. Randol Kern Per Container  . The number of servings contained in the package. . Guidelines for Carbohydrate  . Look at the total grams of carbohydrate in the serving size.  . 1 carbohydrate choice = 15 grams of carbohydrate. Range of Carbohydrate Grams Per Choice  Carbohydrate Grams/Choice Carbohydrate Choices  6-10   11-20 1  21-25 1  26-35 2  36-40 2  41-50 3  51-55 3  56-65 4  66-70 4  71-80 5    Copyright 2020  Academy of [employer] and [employer]etics. All rights reserved.   Catheter-Directed Thrombolysis, Care After This sheet gives you information about how to care for yourself after your procedure. Your health care provider may also give you more specific instructions. If you have problems or questions, contact your health care provider. What can I expect after the procedure? After the procedure, it is common to have mild pain around your incision. Follow these instructions  at home: Incision care   Follow instructions from your health care provider about how to take care of your incision. Make sure you: ? Wash your hands with soap and water before and after you change your bandage (dressing). If soap and water are not available, use hand sanitizer. ? Change your dressing as told by your health care provider.  Keep the incision area clean and dry.  Do not take baths, swim, or use  a hot tub until your health care provider approves. Ask your health care provider if you may take showers. You may only be allowed to take sponge baths.  Check your incision area every day for signs of infection. Check for: ? Redness, swelling, or more pain. ? Fluid or blood. ? Warmth. ? Pus or a bad smell. Medicines  Take over-the-counter and prescription medicines only as told by your health care provider.  If you are taking blood thinners: ? Talk with your health care provider before you take any medicines that contain aspirin or NSAIDs, such as ibuprofen. These medicines increase your risk for dangerous bleeding. ? Take your medicine exactly as told, at the same time every day. ? Avoid activities that could cause injury or bruising, and follow instructions about how to prevent falls. ? Wear a medical alert bracelet or carry a card that lists what medicines you take. Activity   Do not drive until your health care provider approves.  Return to your normal activities as told by your health care provider. Ask your health care provider what activities are safe for you. General instructions   Raise (elevate) your legs above the level of your heart when sitting or lying down. You can do this by putting pillows under your legs.  Wear compression stockings as told by your health care provider. These stockings help to prevent blood clots and reduce swelling in your legs.  Drink enough fluid to keep your urine pale yellow.  Do not use any products that contain nicotine or  tobacco, such as cigarettes, e-cigarettes, and chewing tobacco. If you need help quitting, ask your health care provider.  Keep all follow-up visits as told by your health care provider. This is important. Contact a health care provider if:  You have redness, swelling, or more pain around your incision.  You have fluid or blood coming from your incision.  Your incision feels warm to the touch.  You have pus or a bad smell coming from your incision.  You have chills or a fever.  You bleed or bruise easily.  You have blood in your urine or stool. Get help right away if:  You have any symptoms of a stroke. "BE FAST" is an easy way to remember the main warning signs of a stroke: ? B - Balance. Signs are dizziness, sudden trouble walking, or loss of balance. ? E - Eyes. Signs are trouble seeing or a sudden change in vision. ? F - Face. Signs are sudden weakness or numbness of the face, or the face or eyelid drooping on one side. ? A - Arms. Signs are weakness or numbness in an arm. This happens suddenly and usually on one side of the body. ? S - Speech. Signs are sudden trouble speaking, slurred speech, or trouble understanding what people say. ? T - Time. Time to call emergency services. Write down what time symptoms started.  You have other signs of a stroke, such as: ? A sudden, severe headache with no known cause. ? Nausea or vomiting. ? Seizure.  You have bleeding that does not stop after you apply pressure with your hands for several minutes.  You have chest pain.  You have difficulty breathing.  You cough up blood.  You have redness, warmth, swelling, and pain in an arm or leg. These symptoms may represent a serious problem that is an emergency. Do not wait to see if the symptoms will go away. Get medical help right away. Call your  local emergency services (911 in the [place] Do not drive yourself to the hospital. Summary  After the procedure, it is common to have mild  pain around your incision.  Follow instructions from your health care provider about how to take care of your incision.  If you are taking blood thinners, talk with your health care provider before you take any medicines that contain aspirin or NSAIDs. Follow instructions about how to prevent falls.  Contact your health care provider if you have signs of an infected incision, such as redness, swelling, or more pain.  Get help right away if you have signs of stroke. BE FAST is an easy way to remember the warning signs of stroke. This information is not intended to replace advice given to you by your health care provider. Make sure you discuss any questions you have with your health care provider. Document Revised: 09/10/2018 Document Reviewed: 09/10/2018 Elsevier Patient Education  [place].  ========================================================== Information on my medicine - XARELTO (rivaroxaban)  This medication education was reviewed with me or my healthcare representative as part of my discharge preparation.   WHY WAS XARELTO PRESCRIBED FOR YOU? Xarelto was prescribed to treat blood clots that may have been found in the veins of your legs (deep vein thrombosis) or in your lungs (pulmonary embolism) and to reduce the risk of them occurring again.  What do you need to know about Xarelto? The starting dose is one 15 mg tablet taken TWICE daily with food for the FIRST 21 DAYS then on (enter date)  5/15  the dose is changed to one 20 mg tablet taken ONCE A DAY with your evening meal.  DO NOT stop taking Xarelto without talking to the health care provider who prescribed the medication.  Refill your prescription for 20 mg tablets before you run out.  After discharge, you should have regular check-up appointments with your healthcare provider that is prescribing your Xarelto.  In the future your dose may need to be changed if your kidney function changes by a significant  amount.  What do you do if you miss a dose? If you are taking Xarelto TWICE DAILY and you miss a dose, take it as soon as you remember. You may take two 15 mg tablets (total 30 mg) at the same time then resume your regularly scheduled 15 mg twice daily the next day.  If you are taking Xarelto ONCE DAILY and you miss a dose, take it as soon as you remember on the same day then continue your regularly scheduled once daily regimen the next day. Do not take two doses of Xarelto at the same time.   Important Safety Information Xarelto is a blood thinner medicine that can cause bleeding. You should call your healthcare provider right away if you experience any of the following: ? Bleeding from an injury or your nose that does not stop. ? Unusual colored urine (red or dark brown) or unusual colored stools (red or black). ? Unusual bruising for unknown reasons. ? A serious fall or if you hit your head (even if there is no bleeding).  Some medicines may interact with Xarelto and might increase your risk of bleeding while on Xarelto. To help avoid this, consult your healthcare provider or pharmacist prior to using any new prescription or non-prescription medications, including herbals, vitamins, non-steroidal anti-inflammatory drugs (NSAIDs) and supplements.  This website has more information on Xarelto: [email or web address] =========================================================== Deep Vein Thrombosis    Deep vein thrombosis (DVT) is a  condition in which a blood clot forms in a deep vein, such as a lower leg, thigh, or arm vein. A clot is blood that has thickened into a gel or solid. This condition is dangerous. It can lead to serious and even life-threatening complications if the clot travels to the lungs and causes a blockage (pulmonary embolism). It can also damage veins in the leg. This can result in leg pain, swelling, discoloration, and sores (post-thrombotic syndrome).  What are the  causes? This condition may be caused by:  A slowdown of blood flow.  Damage to a vein.  A condition that causes blood to clot more easily, such as an inherited clotting disorder.   What increases the risk? The following factors may make you more likely to develop this condition: 1. Being overweight. 2. Being older, especially over age 3. 33. Sitting or lying down for more than four hours. 4. Being in the hospital. 5. Lack of physical activity (sedentary lifestyle). 6. Pregnancy, being in childbirth, or having recently given birth. 7. Taking medicines that contain estrogen, such as medicines to prevent pregnancy. 8. Smoking. 9. A history of any of the following: ? Blood clots or a blood clotting disease. ? Peripheral vascular disease. ? Inflammatory bowel disease. ? Cancer. ? Heart disease. ? Genetic conditions that affect how your blood clots, such as Factor V Leiden mutation. ? Neurological diseases that affect your legs (leg paresis). ? A recent injury, such as a car accident. ? Major or lengthy surgery. ? A central line placed inside a large vein.  What are the signs or symptoms? Symptoms of this condition include:  Swelling, pain, or tenderness in an arm or leg.  Warmth, redness, or discoloration in an arm or leg. If the clot is in your leg, symptoms may be more noticeable or worse when you stand or walk. Some people may not develop any symptoms.  How is this diagnosed? This condition is diagnosed with: 1. A medical history and physical exam. 2. Tests, such as: ? Blood tests. These are done to check how well your blood clots. ? Ultrasound. This is done to check for clots. ? Venogram. For this test, contrast dye is injected into a vein and X-rays are taken to check for any clots  How is this treated? Treatment for this condition depends on:  The cause of your DVT.  Your risk for bleeding or developing more clots.  Any other medical conditions that you  have. Treatment may include: 1. Taking a blood thinner (anticoagulant). This type of medicine prevents clots from forming. It may be taken by mouth, injected under the skin, or injected through an IV (catheter). 2. Injecting clot-dissolving medicines into the affected vein (catheter-directed thrombolysis). 3. Having surgery. Surgery may be done to: ? Remove the clot. ? Place a filter in a large vein to catch blood clots before they reach the lungs. Some treatments may be continued for up to six months.  Follow these instructions at home: If you are taking blood thinners: 1. Take the medicine exactly as told by your health care provider. Some blood thinners need to be taken at the same time every day. Do not skip a dose. 2. Talk with your health care provider before you take any medicines that contain aspirin or NSAIDs. These medicines increase your risk for dangerous bleeding. 3. Ask your health care provider about foods and drugs that could change the way the medicine works (may interact). Avoid those things if your health care  provider tells you to do so. 4. Blood thinners can cause easy bruising and may make it difficult to stop bleeding. Because of this: ? Be very careful when using knives, scissors, or other sharp objects. ? Use an electric razor instead of a blade. ? Avoid activities that could cause injury or bruising, and follow instructions about how to prevent falls. 5. Wear a medical alert bracelet or carry a card that lists what medicines you take.  General instructions  Take over-the-counter and prescription medicines only as told by your health care provider.  Return to your normal activities as told by your health care provider. Ask your health care provider what activities are safe for you.  Wear compression stockings if recommended by your health care provider.  Keep all follow-up visits as told by your health care provider. This is important.  How is this  prevented? To lower your risk of developing this condition again: 1. For 30 or more minutes every day, do an activity that: ? Involves moving your arms and legs. ? Increases your heart rate. 2. When traveling for longer than four hours: ? Exercise your arms and legs every hour. ? Drink plenty of water. ? Avoid drinking alcohol. 3. Avoid sitting or lying for a long time without moving your legs. 4. If you have surgery or you are hospitalized, ask about ways to prevent blood clots. These may include taking frequent walks or using anticoagulants. 5. Stay at a healthy weight. 6. If you are a woman who is older than age 68, avoid unnecessary use of medicines that contain estrogen, such as some birth control pills. 7. Do not use any products that contain nicotine or tobacco, such as cigarettes and e-cigarettes. This is especially important if you take estrogen medicines. If you need help quitting, ask your health care provider.  Contact a health care provider if:  You miss a dose of your blood thinner.  Your menstrual period is heavier than usual.  You have unusual bruising.  Get help right away if: 1. You have: ? New or increased pain, swelling, or redness in an arm or leg. ? Numbness or tingling in an arm or leg. ? Shortness of breath. ? Chest pain. ? A rapid or irregular heartbeat. ? A severe headache or confusion. ? A cut that will not stop bleeding. 2. There is blood in your vomit, stool, or urine. 3. You have a serious fall or accident, or you hit your head. 4. You feel light-headed or dizzy. 5. You cough up blood.  These symptoms may represent a serious problem that is an emergency. Do not wait to see if the symptoms will go away. Get medical help right away. Call your local emergency services (911 in the [place] Do not drive yourself to the hospital. Summary  Deep vein thrombosis (DVT) is a condition in which a blood clot forms in a deep vein, such as a lower leg, thigh, or  arm vein.  Symptoms can include swelling, warmth, pain, and redness in your leg or arm.  This condition may be treated with a blood thinner (anticoagulant medicine), medicine that is injected to dissolve blood clots,compression stockings, or surgery.  If you are prescribed blood thinners, take them exactly as told. This information is not intended to replace advice given to you by your health care provider. Make sure you discuss any questions you have with your health care provider. Document Revised: 02/10/2017 Document Reviewed: 07/29/2016 Elsevier Patient Education  [place].

## 2019-07-16 NOTE — Progress Notes (Addendum)
  Progress Note    07/16/2019 7:16 AM 7 Days Post-Op  Subjective:  Says she had a rough night with nausea and felt it may have been due to walking a lot yesterday but feels better today.   Says she wants to go home.  Says she has had a BM everyday  afebrile  Vitals:   07/15/19 2348 07/16/19 0507  BP: (!) 123/54 140/64  Pulse: 92 96  Resp: (!) 23 17  Temp: 98.5 F (36.9 C) 98 F (36.7 C)  SpO2: 93% 98%    Physical Exam: General:  No distress Lungs:  Non labored Extremities:  BLE with compression socks in place.  Bilateral calves are soft Abdomen:  Soft, NT/ND; +BS  CBC    Component Value Date/Time   WBC 9.7 07/16/2019 0339   RBC 3.79 (L) 07/16/2019 0339   HGB 11.1 (L) 07/16/2019 0339   HGB 14.1 03/13/2013 1028   HCT 34.4 (L) 07/16/2019 0339   HCT 43.5 03/13/2013 1028   PLT 250 07/16/2019 0339   PLT 225 03/13/2013 1028   MCV 90.8 07/16/2019 0339   MCV 87 03/13/2013 1028   MCH 29.3 07/16/2019 0339   MCHC 32.3 07/16/2019 0339   RDW 17.6 (H) 07/16/2019 0339   RDW 14.2 03/13/2013 1028   LYMPHSABS 1.8 07/07/2019 0834   LYMPHSABS 1.3 03/13/2013 1028   MONOABS 0.7 07/07/2019 0834   EOSABS 0.1 07/07/2019 0834   EOSABS 0.2 03/13/2013 1028   BASOSABS 0.1 07/07/2019 0834   BASOSABS 0.0 03/13/2013 1028    BMET    Component Value Date/Time   NA 135 07/15/2019 0312   K 4.2 07/15/2019 0312   CL 102 07/15/2019 0312   CO2 24 07/15/2019 0312   GLUCOSE 166 (H) 07/15/2019 0312   BUN 8 07/15/2019 0312   CREATININE 0.69 07/15/2019 0312   CALCIUM 8.5 (L) 07/15/2019 0312   GFRNONAA >60 07/15/2019 0312   GFRAA >60 07/15/2019 0312    INR    Component Value Date/Time   INR 1.1 07/11/2019 0915     Intake/Output Summary (Last 24 hours) at 07/16/2019 0716 Last data filed at 07/15/2019 2056 Gross per 24 hour  Intake 243 ml  Output 600 ml  Net -357 ml     Assessment:  69 y.o. 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  Wants to go home and feels she will be okay as long as she goes with nausea medication.  -her daughter is scheduled for her 2nd covid shot in [place] on Friday-pt states her daughter could stay longer with her if it can be arranged to be given here.  Hopeful this can happen.  Will defer to primary team. -PT/OT recommending HHPT/OT-face to face orders and HH orders placed.  Also ordered HHRN due to new medication regimen -DVT prophylaxis:  Heparin gtt-transition to Xarelto prior to d/c -f/u with Dr. Trula Slade in a few weeks   Leontine Locket, PA-C [facility] [phone] 07/16/2019 7:16 AM   Agree with the above. Patient lost IV access, will start Xarelto this am OK to go home Will get new compression stockings  [facility] Isaiah Cianci

## 2019-07-16 NOTE — Discharge Summary (Addendum)
Physician Discharge Summary  [name] OMV:[id number] DOB: 1950-07-09 DOA: 07/07/2019  PCP: Ann Held, DO  Admit date: 07/07/2019 Discharge date: 07/16/2019  Admitted From: Home Discharge disposition: Home   Recommendations for Outpatient Follow-Up:   1. Patient has been started on insulin for her hemoglobin A1c of greater than 11 will need close follow-up with PCP and adjustment of medications 2. Patient is also been started on Xarelto at the recommendations of vascular surgery   Discharge Diagnosis:   Principal Problem:   Venous thromboembolism confirmed by diagnostic testing Active Problems:   Obesity (BMI 30-39.9)   OSA (obstructive sleep apnea)   Hyperlipidemia LDL goal <100   Diabetes mellitus type 2 in obese (HCC)   Nausea   Acute respiratory failure with hypoxia ([place])    Discharge Condition: Improved.  Diet recommendation: Carbohydrate-modified  Wound care: None.  Code status: Full.   History of Present Illness:   Barbara Thomas is a 69 y.o. female with medical history significant of OSA on CPAP; HLD; DM; and h/o DVT/PE presenting with RLE pain and swelling from knee to foot.  She reports that she broke her toe last Sunday.  She wasn't moving around as much and then drove to [place], [place] on Thursday.  They drove around for several hours looking at the area.  Her leg was swollen and red and kept getting worse.  They barely made it through the rehearsal dinner and so skipped the wedding and drove straight to the hospital.     She had a clot in the past when she broke her ankle.  She was sent home from CIR with a cast.  She got "bad" blood clots in both legs and lungs and a filter was placed.  That was in 2011 and she has had no problems since.  She was on blood thinners for maybe 4-5 years, until she saw Dr. Marin Olp and he took her off.  She has taken 81 mg ASA religiously since then.    She was very thirsty at the rehearsal dinner.  She drank a  lot of sweet tea.  In April 2020 she had an A1c of 7.2 and she was not treated.    She broke her toe when she ran into a door.  She is wearing a hard shoe.   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  Patient was also given support stockings  Acute blood loss anemia Status post transfusion - hemoglobin stable  DM 2 -uncontrolled - previously untreated A1c 11.2 - insulin started this admission  -Patient will be discharged on NPH that she will pick up from [place] due to high cost of insulin through her insurance that she has not met her deductible  Hypokalemia Corrected with supplementation  Severe constipation -Encourage bowel regimen  Obesity - Body mass index is 36.07 kg/m.    Medical Consultants:   Vascular   Discharge Exam:   Vitals:   07/16/19 0735 07/16/19 1120  BP: (!) 143/63 (!) 135/45  Pulse: 98 90  Resp: 14 20  Temp: 98.2 F (36.8 C) 98 F (36.7 C)  SpO2:  97% 98%   Vitals:   07/15/19 2348 07/16/19 0507 07/16/19 0735 07/16/19 1120  BP: (!) 123/54 140/64 (!) 143/63 (!) 135/45  Pulse: 92 96 98 90  Resp: (!) _0 Temp: 98.5 F (36.9 C) 98 F (36.7 C) 98.2 F (36.8 C) 98 F (36.7 C)  TempSrc: Oral Oral Oral Oral  SpO2: 93% 98% 97% 98%  Weight:      Height:        General exam: Appears calm and comfortable.   The results of significant diagnostics from this hospitalization (including imaging, microbiology, ancillary and laboratory) are listed below for reference.     Procedures and Diagnostic Studies:    PERIPHERAL VASCULAR CATHETERIZATION  Result Date: 07/08/2019 Patient name: Vennela Jutte MRN: [id number] DOB: 10-04-1950 Sex: female 07/08/2019 Pre-operative Diagnosis: Acute IVC filter occlusion with bilateral lower extremity DVT Post-operative diagnosis:  Same Surgeon:  Eda Paschal. Donzetta Matters, MD Procedure Performed: 1.  Ultrasound-guided cannulation right small saphenous vein 2.  Ultrasound-guided cannulation left small saphenous vein 3.  Central venogram 4.  Placement of bilateral lower extremity lytic catheter 50 cm treatment length from the filter to the mid femoral vein 5.  Moderate sedation with fentanyl and Versed for 29 minutes Indications: 66 female with history of IVC filter placement.  This is now occluded by CT and duplex has bilateral lower extremity DVT is indicated for lysis with possible filter removal the following day Findings: The left small saphenous vein was patent and compressible this was cannulated and wire was placed centrally.  The right small saphenous vein was occluded we are able to cannulate it and placed a wire centrally.  IVC above the filter was patent and to 50 cm treatment length catheters were placed from the femoral vein to the IVC filter  Procedure:  The patient was identified in the holding area and taken to room 8.  The patient was then placed prone on the procedural table sterilely prepped and draped her bilateral popliteal fossae and timeout was called.  We then cannulated the left small saphenous vein with micropuncture needle followed by wire sheath and placed a 5 French sheath.  We were able to cross centrally to the super filter IVC using very catheter Glidewire advantage confirmed intraluminal access with venogram.  50 cm treatment length catheter was then placed from the filter to the femoral vein and flushed.  Similarly on the left side we cannulated the small saphenous vein which was not compressible we were able to get in with micropuncture needle and wire and sheath.   5 French sheath was placed over Glidewire advantage.  We used very catheter Glidewire advantage to traverse centrally confirmed intraluminal access above the filter.  Another 50 cm treatment length catheter was placed.  This was all done with patient heparinized.  We placed Dermabond at the size of the sheath and placed arm fusion wires in both catheters and flushed them.  These will be hooked to TPA overnight patient will remain heparinized.  She tolerated procedure without any complication. Contrast:  15cc Brandon C. Donzetta Matters, MD [facility] Office: [phone] Pager: [phone]  VAS [place] IVC/ILIAC (VENOUS ONLY)  Result Date: 07/07/2019 IVC/ILIAC STUDY Indications: Acute DVT throughout right lower extremity Other Factors: Lupus, bladder cancer with resection and stenting. History of DVT                and PE. Vascular Interventions: History of IVC filter placed in [place] in 2012 post PE. Limitations:  Air/bowel gas and body habitus.  Comparison Study: No prior study on file Performing Technologist: Sharion Dove RVS Supporting Technologist: Maudry Mayhew RDMS, RVT, RDCS  Examination Guidelines: A complete evaluation includes B-mode imaging, spectral Doppler, color Doppler, and power Doppler as needed of all accessible portions of each vessel. Bilateral testing is considered an integral part of a complete examination. Limited examinations for reoccurring indications may be performed as noted.  IVC/Iliac Findings: +--------+------+--------+--------+   IVC   PatentThrombusComments +--------+------+--------+--------+ IVC Prox       acute           +--------+------+--------+--------+  +-------------------+---------+-----------+---------+-----------+--------+         CIV        RT-PatentRT-ThrombusLT-PatentLT-ThrombusComments +-------------------+---------+-----------+---------+-----------+--------+ Common Iliac Prox              acute                                 +-------------------+---------+-----------+---------+-----------+--------+ Common Iliac Mid               acute                                +-------------------+---------+-----------+---------+-----------+--------+ Common Iliac Distal            acute               acute            +-------------------+---------+-----------+---------+-----------+--------+  +-------------------------+---------+-----------+---------+-----------+--------+            EIV           RT-PatentRT-ThrombusLT-PatentLT-ThrombusComments +-------------------------+---------+-----------+---------+-----------+--------+ External Iliac Vein Prox             acute               acute            +-------------------------+---------+-----------+---------+-----------+--------+ External Iliac Vein Mid              acute               acute            +-------------------------+---------+-----------+---------+-----------+--------+ External Iliac Vein                  acute               acute            Distal                                                                    +-------------------------+---------+-----------+---------+-----------+--------+   Summary: IVC/Iliac: There is evidence of acute thrombus involving the IVC. There is evidence of acute thrombus involving the right common iliac vein. There is evidence of acute thrombus involving the left common iliac vein. There is evidence of acute thrombus involving the right external iliac vein. There is evidence of acute thrombus involving the left external iliac vein.  *See table(s) above for measurements and observations.  Electronically signed by Harold Barban MD on 07/07/2019 at 11:39:53 AM.    Final    DG Foot Complete Right  Result Date: 07/07/2019 CLINICAL DATA:  Pain, fractured small toe a week  ago, increased pain and swelling EXAM: RIGHT FOOT COMPLETE - 3+ VIEW COMPARISON:  None. FINDINGS: Suspect a very subtle nondisplaced  fracture of the base of the right fifth proximal phalanx. No other evident fracture. Joint spaces are preserved. Diffuse soft tissue edema about the forefoot. IMPRESSION: 1. Suspect a very subtle nondisplaced fracture of the base of the right fifth proximal phalanx, in keeping with reported fracture. 2.  Diffuse soft tissue edema about the forefoot. Electronically Signed   By: Eddie Candle M.D.   On: 07/07/2019 10:42   VAS [place] LOWER EXTREMITY VENOUS (DVT) (ONLY [facility] & [facility])  Result Date: 07/07/2019  Lower Venous DVTStudy Indications: Pain, Swelling, and History of DVT. Recent car trip to [place].  Risk Factors: Cancer bladder cancer with stenting and resection in 2015 DVT Bilateral femoral veins in 2012 lupus, IVC filter placed in 01/2011 post PE, in [place]. Comparison Study: Prior study from 02/23/13 is available for comparison Performing Technologist: Sharion Dove RVS  Examination Guidelines: A complete evaluation includes B-mode imaging, spectral Doppler, color Doppler, and power Doppler as needed of all accessible portions of each vessel. Bilateral testing is considered an integral part of a complete examination. Limited examinations for reoccurring indications may be performed as noted. The reflux portion of the exam is performed with the patient in reverse Trendelenburg.  +---------+---------------+---------+-----------+----------+--------------+ RIGHT    CompressibilityPhasicitySpontaneityPropertiesThrombus Aging +---------+---------------+---------+-----------+----------+--------------+ CFV      None           No       No                   Acute          +---------+---------------+---------+-----------+----------+--------------+ SFJ      None                                         Acute          +---------+---------------+---------+-----------+----------+--------------+ FV Prox  None                                         Acute           +---------+---------------+---------+-----------+----------+--------------+ FV Mid   None                                         Acute          +---------+---------------+---------+-----------+----------+--------------+ FV DistalNone                                         Acute          +---------+---------------+---------+-----------+----------+--------------+ PFV      None                                         Acute          +---------+---------------+---------+-----------+----------+--------------+ POP      None           No       No  Acute          +---------+---------------+---------+-----------+----------+--------------+ PTV      None                                         Acute          +---------+---------------+---------+-----------+----------+--------------+ PERO     None                                         Acute          +---------+---------------+---------+-----------+----------+--------------+ Gastroc  None                                         Acute          +---------+---------------+---------+-----------+----------+--------------+ GSV      Full                                                        +---------+---------------+---------+-----------+----------+--------------+   +----+---------------+---------+-----------+----------+--------------+ LEFTCompressibilityPhasicitySpontaneityPropertiesThrombus Aging +----+---------------+---------+-----------+----------+--------------+ CFV Full           No       No                   sluggish flow  +----+---------------+---------+-----------+----------+--------------+     Summary: RIGHT: - Findings consistent with acute deep vein thrombosis involving the right common femoral vein, SF junction, right femoral vein, right proximal profunda vein, right popliteal vein, right posterior tibial veins, right peroneal veins, and right gastrocnemius veins.  LEFT: - No  evidence of common femoral vein obstruction.  *See table(s) above for measurements and observations. Electronically signed by Harold Barban MD on 07/07/2019 at 11:40:10 AM.    Final    CT Angio Abd/Pel W and/or Wo Contrast  Result Date: 07/07/2019 CLINICAL DATA:  Leg vein DVT suspected EXAM: CTA ABDOMEN AND PELVIS WITHOUT AND WITH CONTRAST TECHNIQUE: Multidetector CT imaging of the abdomen and pelvis was performed using the standard protocol during bolus administration of intravenous contrast. Multiplanar reconstructed images and MIPs were obtained and reviewed to evaluate the vascular anatomy. CONTRAST:  149m OMNIPAQUE IOHEXOL 350 MG/ML SOLN COMPARISON:  11/21/2013 FINDINGS: VASCULAR Scattered aortic atherosclerosis. Normal contour and caliber of the abdominal aorta. Standard branching pattern of the abdominal aorta with solitary bilateral renal arteries. There is a Recovery type IVC filter in the infrarenal IVC. The IVC at the level of the filter and inferiorly and bilateral common iliac veins, as well as included portions of the right internal and external iliac and superior portions of the right femoral veins are somewhat expansile appearing and non-opacified on 120 second delayed phase imaging. Review of the MIP images confirms the above findings. NON-VASCULAR Lower chest: No acute abnormality. Hepatobiliary: No solid liver abnormality is seen. Hepatic steatosis. No gallstones, gallbladder wall thickening, or biliary dilatation. Pancreas: Unremarkable. No pancreatic ductal dilatation or surrounding inflammatory changes. Spleen: Normal in size without significant abnormality. Adrenals/Urinary Tract: Adrenal glands are unremarkable. Kidneys are normal, without renal calculi, solid lesion, or hydronephrosis. Bladder is unremarkable. Stomach/Bowel: Stomach is within normal  limits. Appendix appears normal. No evidence of bowel wall thickening, distention, or inflammatory changes. Lymphatic:  No enlarged  abdominal or pelvic lymph nodes. Reproductive: Status post hysterectomy. Other: No abdominal wall hernia or abnormality. No abdominopelvic ascites. Musculoskeletal: No acute or significant osseous findings. IMPRESSION: 1. There is a Recovery type IVC filter in the infrarenal IVC. The IVC at the level of the filter and inferiorly and bilateral common iliac veins, as well as included portions of the right internal and external iliac and superior portions of the right femoral veins are somewhat expansile appearing and non-opacified on 120 second delayed phase imaging. Findings are consistent with acute appearing central venous and deep venous thrombosis of the IVC, bilateral common iliac veins, and the included more distal right iliac and femoral veins. 2.  Hepatic steatosis. 3.  Aortic Atherosclerosis (ICD10-I70.0). Electronically Signed   By: Eddie Candle M.D.   On: 07/07/2019 11:49     Labs:   Basic Metabolic Panel: Recent Labs  Lab 07/11/19 0432 07/11/19 0432 07/12/19 0433 07/12/19 0433 07/13/19 0246 07/13/19 0246 07/14/19 0030 07/15/19 0312  NA 137  --  140  --  139  --  137 135  K 3.5   < > 3.6   < > 3.2*   < > 3.8 4.2  CL 108  --  109  --  105  --  104 102  CO2 23  --  18*  --  26  --  24 24  GLUCOSE 201*  --  180*  --  179*  --  169* 166*  BUN 11  --  10  --  7*  --  6* 8  CREATININE 0.72  --  0.63  --  0.67  --  0.68 0.69  CALCIUM 7.7*  --  8.1*  --  8.3*  --  8.4* 8.5*  MG 1.8  --   --   --  1.9  --  1.9 2.0   < > = values in this interval not displayed.   GFR Estimated Creatinine Clearance: 96.3 mL/min (by C-G formula based on SCr of 0.69 mg/dL). Liver Function Tests: No results for input(s): AST, ALT, ALKPHOS, BILITOT, PROT, ALBUMIN in the last 168 hours. No results for input(s): LIPASE, AMYLASE in the last 168 hours. No results for input(s): AMMONIA in the last 168 hours. Coagulation profile Recent Labs  Lab 07/11/19 0915  INR 1.1    CBC: Recent Labs  Lab  07/12/19 1829 07/13/19 0246 07/14/19 0030 07/15/19 0312 07/16/19 0339  WBC 7.4 6.8 9.0 9.0 9.7  HGB 10.2* 9.3* 10.2* 10.8* 11.1*  HCT 30.5* 28.1* 31.3* 33.8* 34.4*  MCV 87.1 86.5 89.4 89.9 90.8  PLT 110* 121* 162 218 250   Cardiac Enzymes: No results for input(s): CKTOTAL, CKMB, CKMBINDEX, TROPONINI in the last 168 hours. BNP: Invalid input(s): POCBNP CBG: Recent Labs  Lab 07/15/19 1104 07/15/19 1639 07/15/19 2139 07/16/19 0615 07/16/19 1054  GLUCAP 211* 173* 155* 131* 218*   D-Dimer No results for input(s): DDIMER in the last 72 hours. Hgb A1c No results for input(s): HGBA1C in the last 72 hours. Lipid Profile No results for input(s): CHOL, HDL, LDLCALC, TRIG, CHOLHDL, LDLDIRECT in the last 72 hours. Thyroid function studies No results for input(s): TSH, T4TOTAL, T3FREE, THYROIDAB in the last 72 hours.  Invalid input(s): FREET3 Anemia work up No results for input(s): VITAMINB12, FOLATE, FERRITIN, TIBC, IRON, RETICCTPCT in the last 72 hours. Microbiology Recent Results (from the past 240 hour(s))  SARS CORONAVIRUS 2 (TAT 6-24 HRS) Nasopharyngeal Nasopharyngeal Swab     Status: None   Collection Time: 07/07/19  3:33 PM   Specimen: Nasopharyngeal Swab  Result Value Ref Range Status   SARS Coronavirus 2 NEGATIVE NEGATIVE Final    Comment: (NOTE) SARS-CoV-2 target nucleic acids are NOT DETECTED. The SARS-CoV-2 RNA is generally detectable in upper and lower respiratory specimens during the acute phase of infection. Negative results do not preclude SARS-CoV-2 infection, do not rule out co-infections with other pathogens, and should not be used as the sole basis for treatment or other patient management decisions. Negative results must be combined with clinical observations, patient history, and epidemiological information. The expected result is Negative. Fact Sheet for Patients: SugarRoll.be Fact Sheet for Healthcare  Providers: https://www.woods-mathews.com/ This test is not yet approved or cleared by the [place] FDA and  has been authorized for detection and/or diagnosis of SARS-CoV-2 by FDA under an Emergency Use Authorization (EUA). This EUA will remain  in effect (meaning this test can be used) for the duration of the COVID-19 declaration under Section 56 4(b)(1) of the Act, 21 U.S.C. section 360bbb-3(b)(1), unless the authorization is terminated or revoked sooner. Performed at [facility], [place] [address], [place], [place] [address]   MRSA PCR Screening     Status: None   Collection Time: 07/08/19  5:40 PM   Specimen: Nasal Mucosa; Nasopharyngeal  Result Value Ref Range Status   MRSA by PCR NEGATIVE NEGATIVE Final    Comment:        The GeneXpert MRSA Assay (FDA approved for NASAL specimens only), is one component of a comprehensive MRSA colonization surveillance program. It is not intended to diagnose MRSA infection nor to guide or monitor treatment for MRSA infections. Performed at [facility], [place] [address] [place], [place] [address]      Discharge Instructions:   Discharge Instructions    Diet Carb Modified   Complete by: As directed    Increase activity slowly   Complete by: As directed      Allergies as of 07/16/2019      Reactions   Penicillins Rash   Morphine And Related Nausea And Vomiting      Medication List    STOP taking these medications   aspirin 81 MG chewable tablet     TAKE these medications   blood glucose meter kit and supplies Kit Dispense based on patient and insurance preference. Use up to four times daily as directed. (FOR ICD-9 250.00, 250.01).   CALCIUM PO Take 1,200 mg by mouth 2 (two) times daily.   cholecalciferol 1000 units tablet Commonly known as: VITAMIN D Take 1 tablet (1,000 Units total) by mouth daily. What changed:   how much to take  when to take this   co-enzyme Q-10 30 MG capsule Take  100 mg by mouth daily.   FISH OIL PO Take 1,000 mg by mouth daily.   glucosamine-chondroitin 500-400 MG tablet Take 1 tablet by mouth daily.   montelukast 10 MG tablet Commonly known as: SINGULAIR TAKE 1 TABLET AT BEDTIME   multivitamin tablet Take 1 tablet by mouth daily.   NovoLIN N FlexPen ReliOn 100 UNIT/ML Kiwkpen Generic drug: Insulin NPH (Human) (Isophane) Inject 12 units into the skin 2 times daily at 8AM and 10PM   ondansetron 4 MG tablet Commonly known as: Zofran Take 1 tablet (4 mg total) by mouth daily as needed for nausea or vomiting.   oxyCODONE-acetaminophen 5-325  MG tablet Commonly known as: PERCOCET/ROXICET Take 1 tablet by mouth every 4 (four) hours as needed for up to 3 days for moderate pain.   Pen Needles 32G X 5 MM Misc Use as directed with insulin pen   Rivaroxaban Stater Pack (15 mg and 20 mg) Commonly known as: XARELTO STARTER PACK Follow package directions: Take one 64m tablet by mouth twice a day. On day 22, switch to one 214mtablet once a day. Take with food.   simvastatin 40 MG tablet Commonly known as: ZOCOR TAKE 1 TABLET AT BEDTIME            Durable Medical Equipment  (From admission, onward)         Start     Ordered   07/15/19 1354  For home use only DME Other see comment  Once    Comments: Lift chair for limited mobility and retroperitoneal hematoma  Question:  Length of Need  Answer:  Lifetime   07/15/19 1355         Follow-up Information    BrSerafina MitchellMD In 6 weeks.   Specialties: Vascular Surgery, Cardiology Why: Office will call you to arrange your appt (sent) Contact information: [id number], [place] Follow up in 1 week(s).   Specialty: Family Medicine Contact information: [phone]. [phone]      [facility] Follow up.   Specialty: [place]: HHPT/OT  arranged- they will contact you within 48 hr post discharge to set up visits           Time coordinating discharge: 35 min  Signed:  JeGeradine GirtO  [facility] 07/16/2019, 4:06 PM

## 2019-07-16 NOTE — Progress Notes (Signed)
Discharge AVS meds take and those due reviewed with pt. Follow up appointments and when to call MD reviewed. All questions and concerns addressed. No further questions at this time. D/c IV and TELE, CCMD notified. D/C home per orders. Adler Alton N Deloma Spindle, RN  

## 2019-07-17 ENCOUNTER — Telehealth: Payer: Self-pay | Admitting: *Deleted

## 2019-07-17 NOTE — Telephone Encounter (Signed)
1st attempt. Unable to reach patient.   

## 2019-07-18 DIAGNOSIS — K449 Diaphragmatic hernia without obstruction or gangrene: Secondary | ICD-10-CM | POA: Diagnosis not present

## 2019-07-18 DIAGNOSIS — I700 Atherosclerosis of aorta: Secondary | ICD-10-CM | POA: Diagnosis not present

## 2019-07-18 DIAGNOSIS — E669 Obesity, unspecified: Secondary | ICD-10-CM | POA: Diagnosis not present

## 2019-07-18 DIAGNOSIS — Z7901 Long term (current) use of anticoagulants: Secondary | ICD-10-CM | POA: Diagnosis not present

## 2019-07-18 DIAGNOSIS — Z95828 Presence of other vascular implants and grafts: Secondary | ICD-10-CM | POA: Diagnosis not present

## 2019-07-18 DIAGNOSIS — J9601 Acute respiratory failure with hypoxia: Secondary | ICD-10-CM | POA: Diagnosis not present

## 2019-07-18 DIAGNOSIS — T82598D Other mechanical complication of other cardiac and vascular devices and implants, subsequent encounter: Secondary | ICD-10-CM | POA: Diagnosis not present

## 2019-07-18 DIAGNOSIS — Z85828 Personal history of other malignant neoplasm of skin: Secondary | ICD-10-CM | POA: Diagnosis not present

## 2019-07-18 DIAGNOSIS — Z87442 Personal history of urinary calculi: Secondary | ICD-10-CM | POA: Diagnosis not present

## 2019-07-18 DIAGNOSIS — Z86711 Personal history of pulmonary embolism: Secondary | ICD-10-CM | POA: Diagnosis not present

## 2019-07-18 DIAGNOSIS — D62 Acute posthemorrhagic anemia: Secondary | ICD-10-CM | POA: Diagnosis not present

## 2019-07-18 DIAGNOSIS — E1165 Type 2 diabetes mellitus with hyperglycemia: Secondary | ICD-10-CM | POA: Diagnosis not present

## 2019-07-18 DIAGNOSIS — K5730 Diverticulosis of large intestine without perforation or abscess without bleeding: Secondary | ICD-10-CM | POA: Diagnosis not present

## 2019-07-18 DIAGNOSIS — K5900 Constipation, unspecified: Secondary | ICD-10-CM | POA: Diagnosis not present

## 2019-07-18 DIAGNOSIS — I82402 Acute embolism and thrombosis of unspecified deep veins of left lower extremity: Secondary | ICD-10-CM | POA: Diagnosis not present

## 2019-07-18 DIAGNOSIS — I82401 Acute embolism and thrombosis of unspecified deep veins of right lower extremity: Secondary | ICD-10-CM | POA: Diagnosis not present

## 2019-07-18 DIAGNOSIS — S92502D Displaced unspecified fracture of left lesser toe(s), subsequent encounter for fracture with routine healing: Secondary | ICD-10-CM | POA: Diagnosis not present

## 2019-07-18 DIAGNOSIS — Z86718 Personal history of other venous thrombosis and embolism: Secondary | ICD-10-CM | POA: Diagnosis not present

## 2019-07-18 NOTE — Telephone Encounter (Signed)
I have made two attempts and have been unable to reach patient. I have mailed the patient a letter requesting they call the office to schedule a hospital follow up appointment.   

## 2019-07-19 ENCOUNTER — Telehealth: Payer: Self-pay

## 2019-07-19 NOTE — Telephone Encounter (Signed)
Patient's Physical therapist Mrs. Ross  from [facility] called in to get verbal orders for Physical therapy 2X a week for 3 weeks 1X time a week for 4 weeks. Please call Mrs. Ross at  [phone] Faythe Ghee to leave a message.

## 2019-07-19 NOTE — Telephone Encounter (Signed)
Barbara Thomas has not seen patient in over a year. Okay to give verbal? Please advise

## 2019-07-19 NOTE — Telephone Encounter (Signed)
Please advise 

## 2019-07-19 NOTE — Telephone Encounter (Signed)
If she has not seen a PCP in more than a year I would leave it up to her.  She is coming back to the office soon.

## 2019-07-22 ENCOUNTER — Other Ambulatory Visit: Payer: Self-pay | Admitting: *Deleted

## 2019-07-22 NOTE — Telephone Encounter (Signed)
Called and left VM with verbal. Advised that we haven't seen patient in over a year and she needs a OV. Attempted to call patient but phone kept ringing.

## 2019-07-22 NOTE — Patient Outreach (Signed)
[facility]) Care Management  07/22/2019  Karyne Cunniff [birth year] [id number]   EMMI-GENERAL DISCHARGE-RESOLVED NO NEEDS RED ON EMMI ALERT Day # 1 & 4 Date: 5/6 & 5/9 Red Alert Reason: follow up appointment [place] interest  Outreach #1  RN spoke with pt concerning the above emmi. Pt verified the follow up appointment has been resolved. Pt states concerning the lost of interest is more "generlized" where she just does not fell like talking to anyone due to her ongoing recovery. States she is aware of the symptoms she is having at this time reported by her provider however just not feeling well at this time. Pt has been informed that her symptoms will pass and improve. RN strongly encouraged pt to contact her provider with any symptoms that worsen or chnges in her condition (pt with understanding). RN verified pt is not having any suicidcal thoughts or deep depression at this time just not feeling like "herself". Declined any counseling at this time however grateful.  Based upon today's discussion all emmi above resolved with no additional needs. Will close this case.  Raina Mina, RN Care Management Coordinator [place] Office [phone]

## 2019-07-22 NOTE — Telephone Encounter (Signed)
Ok to give verbal but pt needs ov

## 2019-07-24 DIAGNOSIS — E1165 Type 2 diabetes mellitus with hyperglycemia: Secondary | ICD-10-CM | POA: Diagnosis not present

## 2019-07-24 DIAGNOSIS — D62 Acute posthemorrhagic anemia: Secondary | ICD-10-CM | POA: Diagnosis not present

## 2019-07-24 DIAGNOSIS — I82402 Acute embolism and thrombosis of unspecified deep veins of left lower extremity: Secondary | ICD-10-CM | POA: Diagnosis not present

## 2019-07-24 DIAGNOSIS — I82401 Acute embolism and thrombosis of unspecified deep veins of right lower extremity: Secondary | ICD-10-CM | POA: Diagnosis not present

## 2019-07-24 DIAGNOSIS — T82598D Other mechanical complication of other cardiac and vascular devices and implants, subsequent encounter: Secondary | ICD-10-CM | POA: Diagnosis not present

## 2019-07-24 DIAGNOSIS — J9601 Acute respiratory failure with hypoxia: Secondary | ICD-10-CM | POA: Diagnosis not present

## 2019-07-26 ENCOUNTER — Other Ambulatory Visit: Payer: Self-pay

## 2019-07-26 ENCOUNTER — Encounter: Payer: Self-pay | Admitting: Family Medicine

## 2019-07-26 ENCOUNTER — Ambulatory Visit: Payer: Medicare Other | Admitting: Family Medicine

## 2019-07-26 VITALS — BP 110/60 | HR 111 | Temp 97.7°F | Resp 18 | Ht 71.0 in | Wt 211.2 lb

## 2019-07-26 DIAGNOSIS — I2694 Multiple subsegmental pulmonary emboli without acute cor pulmonale: Secondary | ICD-10-CM

## 2019-07-26 DIAGNOSIS — E1165 Type 2 diabetes mellitus with hyperglycemia: Secondary | ICD-10-CM | POA: Diagnosis not present

## 2019-07-26 DIAGNOSIS — F419 Anxiety disorder, unspecified: Secondary | ICD-10-CM

## 2019-07-26 DIAGNOSIS — E785 Hyperlipidemia, unspecified: Secondary | ICD-10-CM

## 2019-07-26 DIAGNOSIS — R110 Nausea: Secondary | ICD-10-CM

## 2019-07-26 MED ORDER — SERTRALINE HCL 50 MG PO TABS: 50.0000 mg | ORAL_TABLET | Freq: Every day | ORAL | 3 refills | Status: DC

## 2019-07-26 MED ORDER — RIVAROXABAN 20 MG PO TABS: 20.0000 mg | ORAL_TABLET | Freq: Every day | ORAL | 3 refills | Status: DC

## 2019-07-26 NOTE — Progress Notes (Signed)
Patient ID: Barbara Thomas, female    DOB: July 07, 1950  Age: 69 y.o. MRN: [id number]    Subjective:  Subjective  HPI,  [name] presents for f/u hosp   She was admitted 4/25-5/4 for mult PE and uncontrolled DM.    Pt had a broken toe and was sedentary.   She then drove to [facility] where she started having CP / SOB--- they left early and drove to [place] where she was dx PE,  And had hgba1c 11  After IVC filter removal she was found to have retroperitoneal hemorrhage   Review of Systems  Constitutional: Positive for fatigue. Negative for appetite change, diaphoresis and unexpected weight change.  Eyes: Negative for pain, redness and visual disturbance.  Respiratory: Negative for cough, chest tightness, shortness of breath and wheezing.   Cardiovascular: Negative for chest pain, palpitations and leg swelling.  Endocrine: Negative for cold intolerance, heat intolerance, polydipsia, polyphagia and polyuria.  Genitourinary: Negative for difficulty urinating, dysuria and frequency.  Neurological: Negative for dizziness, light-headedness, numbness and headaches.    History Past Medical History:  Diagnosis Date  . Allergy   . Arthritis   . Blood transfusion without reported diagnosis    1987  . Cancer (HCC)    Basal Cell Carcinoma  . Cataract   . Chronic kidney disease    kidney stone once  . H/O hiatal hernia   . History of basal cell carcinoma excision    NOSE  . History of benign bladder tumor   . History of DVT of lower extremity    11/ 2011  BILATERAL  POST FOOT SURGERY  . History of pulmonary embolus (PE)    12/ 2011   POST FOOT SURGERY  . Hyperlipidemia   . Left ureteral calculus   . Migraines   . OSA on CPAP    STUDY DONE 2012  . PONV (postoperative nausea and vomiting)    severe  . Sigmoid diverticulosis   . Wears glasses     She has a past surgical history that includes Hip pinning (Left, 1985); BENIGN RIGHT BREAST BX (12-06-2010); Insertion of vena cava filter (12/  2011); ORIF LEFT ANKLE FX (11/ 2011); Partial hip arthroplasty (Left, 1987); REVISION HIP HEMIARTHROPLASTY  (Left, 2004); Cystoscopy with retrograde pyelogram, ureteroscopy and stent placement (Left, 11/25/2013); Holmium laser application (Left, 07/14/7739); Vaginal hysterectomy (2005); Colonoscopy; Incontinence surgery; IVC Venography (N/A, 07/08/2019); and Thrombectomy iliac artery (Bilateral, 07/09/2019).   Her family history includes Atrial fibrillation in her mother; Cancer in her father; Dementia in her mother; Diabetes in her brother; Hypertension in her father and mother; Stroke in her paternal grandmother; Sudden death in her maternal grandmother; Transient ischemic attack in her mother.She reports that she has never smoked. She has never used smokeless tobacco. She reports that she does not drink alcohol or use drugs.  Current Outpatient Medications on File Prior to Visit  Medication Sig Dispense Refill  . blood glucose meter kit and supplies KIT Dispense based on patient and insurance preference. Use up to four times daily as directed. (FOR ICD-9 250.00, 250.01). 1 each 0  . CALCIUM PO Take 1,200 mg by mouth 2 (two) times daily.     . cholecalciferol (VITAMIN D) 1000 units tablet Take 1 tablet (1,000 Units total) by mouth daily. (Patient taking differently: Take 2,000 Units by mouth 2 (two) times daily. ) 90 tablet 0  . co-enzyme Q-10 30 MG capsule Take 100 mg by mouth daily.     Marland Kitchen glucosamine-chondroitin 500-400  MG tablet Take 1 tablet by mouth daily.     . Insulin NPH, Human,, Isophane, (NOVOLIN N FLEXPEN RELION) 100 UNIT/ML Kiwkpen Inject 12 units into the skin 2 times daily at 8AM and 10PM 15 mL 1  . Insulin Pen Needle (PEN NEEDLES) 32G X 5 MM MISC Use as directed with insulin pen 100 each 1  . montelukast (SINGULAIR) 10 MG tablet TAKE 1 TABLET AT BEDTIME (Patient taking differently: Take 10 mg by mouth at bedtime. ) 90 tablet 3  . Multiple Vitamin (MULTIVITAMIN) tablet Take 1 tablet by  mouth daily.      . Omega-3 Fatty Acids (FISH OIL PO) Take 1,000 mg by mouth daily.     . ondansetron (ZOFRAN) 4 MG tablet Take 1 tablet (4 mg total) by mouth daily as needed for nausea or vomiting. 30 tablet 0  . RIVAROXABAN (XARELTO) VTE STARTER PACK (15 & 20 MG TABLETS) Follow package directions: Take one 79m tablet by mouth twice a day. On day 22, switch to one 239mtablet once a day. Take with food. 51 each 0  . simvastatin (ZOCOR) 40 MG tablet TAKE 1 TABLET AT BEDTIME 30 tablet 1   No current facility-administered medications on file prior to visit.     Objective:  Objective  Physical Exam Vitals and nursing note reviewed.  Constitutional:      Appearance: She is well-developed.  HENT:     Head: Normocephalic and atraumatic.  Eyes:     Conjunctiva/sclera: Conjunctivae normal.  Neck:     Thyroid: No thyromegaly.     Vascular: No carotid bruit or JVD.  Cardiovascular:     Rate and Rhythm: Normal rate and regular rhythm.     Heart sounds: Normal heart sounds. No murmur.  Pulmonary:     Effort: Pulmonary effort is normal. No respiratory distress.     Breath sounds: Normal breath sounds. No wheezing or rales.  Chest:     Chest wall: No tenderness.  Musculoskeletal:     Cervical back: Normal range of motion and neck supple.  Neurological:     Mental Status: She is alert and oriented to person, place, and time.    BP 110/60 (BP Location: Left Arm, Patient Position: Sitting, Cuff Size: Normal)   Pulse (!) 111   Temp 97.7 F (36.5 C) (Temporal)   Resp 18   Ht _0  (1.803 m)   Wt 211 lb 3.2 oz (95.8 kg)   SpO2 95%   BMI 29.46 kg/m  Wt Readings from Last 3 Encounters:  07/26/19 211 lb 3.2 oz (95.8 kg)  07/12/19 258 lb 9.6 oz (117.3 kg)  11/27/17 229 lb 6.4 oz (104.1 kg)     Lab Results  Component Value Date   WBC 9.7 07/16/2019   HGB 11.1 (L) 07/16/2019   HCT 34.4 (L) 07/16/2019   PLT 250 07/16/2019   GLUCOSE 166 (H) 07/15/2019   CHOL 131 07/13/2019   TRIG  160 (H) 07/13/2019   HDL 36 (L) 07/13/2019   LDLDIRECT 111.0 11/27/2017   LDLCALC 63 07/13/2019   ALT 28 07/07/2019   AST 16 07/07/2019   NA 135 07/15/2019   K 4.2 07/15/2019   CL 102 07/15/2019   CREATININE 0.69 07/15/2019   BUN 8 07/15/2019   CO2 24 07/15/2019   TSH 1.98 08/25/2015   INR 1.1 07/11/2019   HGBA1C 11.2 (H) 07/07/2019    PERIPHERAL VASCULAR CATHETERIZATION  Result Date: 07/08/2019 Patient name: JuLeanny MoeckelRN: [id number]: [birth year]  Sex: female 07/08/2019 Pre-operative Diagnosis: Acute IVC filter occlusion with bilateral lower extremity DVT Post-operative diagnosis:  Same Surgeon:  Erlene Quan C. Donzetta Matters, MD Procedure Performed: 1.  Ultrasound-guided cannulation right small saphenous vein 2.  Ultrasound-guided cannulation left small saphenous vein 3.  Central venogram 4.  Placement of bilateral lower extremity lytic catheter 50 cm treatment length from the filter to the mid femoral vein 5.  Moderate sedation with fentanyl and Versed for 29 minutes Indications: 55 female with history of IVC filter placement.  This is now occluded by CT and duplex has bilateral lower extremity DVT is indicated for lysis with possible filter removal the following day Findings: The left small saphenous vein was patent and compressible this was cannulated and wire was placed centrally.  The right small saphenous vein was occluded we are able to cannulate it and placed a wire centrally.  IVC above the filter was patent and to 50 cm treatment length catheters were placed from the femoral vein to the IVC filter  Procedure:  The patient was identified in the holding area and taken to room 8.  The patient was then placed prone on the procedural table sterilely prepped and draped her bilateral popliteal fossae and timeout was called.  We then cannulated the left small saphenous vein with micropuncture needle followed by wire sheath and placed a 5 French sheath.  We were able to cross centrally to the super filter  IVC using very catheter Glidewire advantage confirmed intraluminal access with venogram.  50 cm treatment length catheter was then placed from the filter to the femoral vein and flushed.  Similarly on the left side we cannulated the small saphenous vein which was not compressible we were able to get in with micropuncture needle and wire and sheath.  5 French sheath was placed over Glidewire advantage.  We used very catheter Glidewire advantage to traverse centrally confirmed intraluminal access above the filter.  Another 50 cm treatment length catheter was placed.  This was all done with patient heparinized.  We placed Dermabond at the size of the sheath and placed arm fusion wires in both catheters and flushed them.  These will be hooked to TPA overnight patient will remain heparinized.  She tolerated procedure without any complication. Contrast:  15cc Brandon C. Donzetta Matters, MD [facility] Office: [phone] Pager: [phone]  VAS [place] IVC/ILIAC (VENOUS ONLY)  Result Date: 07/07/2019 IVC/ILIAC STUDY Indications: Acute DVT throughout right lower extremity Other Factors: Lupus, bladder cancer with resection and stenting. History of DVT                and PE. Vascular Interventions: History of IVC filter placed in [place] in 2012 post PE. Limitations: Air/bowel gas and body habitus.  Comparison Study: No prior study on file Performing Technologist: Sharion Dove RVS Supporting Technologist: Maudry Mayhew RDMS, RVT, RDCS  Examination Guidelines: A complete evaluation includes B-mode imaging, spectral Doppler, color Doppler, and power Doppler as needed of all accessible portions of each vessel. Bilateral testing is considered an integral part of a complete examination. Limited examinations for reoccurring indications may be performed as noted.  IVC/Iliac Findings: +--------+------+--------+--------+   IVC   PatentThrombusComments +--------+------+--------+--------+ IVC Prox        acute           +--------+------+--------+--------+  +-------------------+---------+-----------+---------+-----------+--------+         CIV        RT-PatentRT-ThrombusLT-PatentLT-ThrombusComments +-------------------+---------+-----------+---------+-----------+--------+ Common Iliac Prox  acute                                +-------------------+---------+-----------+---------+-----------+--------+ Common Iliac Mid               acute                                +-------------------+---------+-----------+---------+-----------+--------+ Common Iliac Distal            acute               acute            +-------------------+---------+-----------+---------+-----------+--------+  +-------------------------+---------+-----------+---------+-----------+--------+            EIV           RT-PatentRT-ThrombusLT-PatentLT-ThrombusComments +-------------------------+---------+-----------+---------+-----------+--------+ External Iliac Vein Prox             acute               acute            +-------------------------+---------+-----------+---------+-----------+--------+ External Iliac Vein Mid              acute               acute            +-------------------------+---------+-----------+---------+-----------+--------+ External Iliac Vein                  acute               acute            Distal                                                                    +-------------------------+---------+-----------+---------+-----------+--------+   Summary: IVC/Iliac: There is evidence of acute thrombus involving the IVC. There is evidence of acute thrombus involving the right common iliac vein. There is evidence of acute thrombus involving the left common iliac vein. There is evidence of acute thrombus involving the right external iliac vein. There is evidence of acute thrombus involving the left external iliac vein.  *See table(s) above for  measurements and observations.  Electronically signed by Harold Barban MD on 07/07/2019 at 11:39:53 AM.    Final    DG Foot Complete Right  Result Date: 07/07/2019 CLINICAL DATA:  Pain, fractured small toe a week ago, increased pain and swelling EXAM: RIGHT FOOT COMPLETE - 3+ VIEW COMPARISON:  None. FINDINGS: Suspect a very subtle nondisplaced fracture of the base of the right fifth proximal phalanx. No other evident fracture. Joint spaces are preserved. Diffuse soft tissue edema about the forefoot. IMPRESSION: 1. Suspect a very subtle nondisplaced fracture of the base of the right fifth proximal phalanx, in keeping with reported fracture. 2.  Diffuse soft tissue edema about the forefoot. Electronically Signed   By: Eddie Candle M.D.   On: 07/07/2019 10:42   VAS [place] LOWER EXTREMITY VENOUS (DVT) (ONLY [facility] & [facility])  Result Date: 07/07/2019  Lower Venous DVTStudy Indications: Pain, Swelling, and History of DVT. Recent car trip to [place].  Risk Factors: Cancer bladder cancer with stenting and resection in 2015 DVT Bilateral femoral  veins in 2012 lupus, IVC filter placed in 01/2011 post PE, in [place]. Comparison Study: Prior study from 02/23/13 is available for comparison Performing Technologist: Sharion Dove RVS  Examination Guidelines: A complete evaluation includes B-mode imaging, spectral Doppler, color Doppler, and power Doppler as needed of all accessible portions of each vessel. Bilateral testing is considered an integral part of a complete examination. Limited examinations for reoccurring indications may be performed as noted. The reflux portion of the exam is performed with the patient in reverse Trendelenburg.  +---------+---------------+---------+-----------+----------+--------------+ RIGHT    CompressibilityPhasicitySpontaneityPropertiesThrombus Aging +---------+---------------+---------+-----------+----------+--------------+ CFV      None           No       No                   Acute           +---------+---------------+---------+-----------+----------+--------------+ SFJ      None                                         Acute          +---------+---------------+---------+-----------+----------+--------------+ FV Prox  None                                         Acute          +---------+---------------+---------+-----------+----------+--------------+ FV Mid   None                                         Acute          +---------+---------------+---------+-----------+----------+--------------+ FV DistalNone                                         Acute          +---------+---------------+---------+-----------+----------+--------------+ PFV      None                                         Acute          +---------+---------------+---------+-----------+----------+--------------+ POP      None           No       No                   Acute          +---------+---------------+---------+-----------+----------+--------------+ PTV      None                                         Acute          +---------+---------------+---------+-----------+----------+--------------+ PERO     None                                         Acute          +---------+---------------+---------+-----------+----------+--------------+  Gastroc  None                                         Acute          +---------+---------------+---------+-----------+----------+--------------+ GSV      Full                                                        +---------+---------------+---------+-----------+----------+--------------+   +----+---------------+---------+-----------+----------+--------------+ LEFTCompressibilityPhasicitySpontaneityPropertiesThrombus Aging +----+---------------+---------+-----------+----------+--------------+ CFV Full           No       No                   sluggish flow   +----+---------------+---------+-----------+----------+--------------+     Summary: RIGHT: - Findings consistent with acute deep vein thrombosis involving the right common femoral vein, SF junction, right femoral vein, right proximal profunda vein, right popliteal vein, right posterior tibial veins, right peroneal veins, and right gastrocnemius veins.  LEFT: - No evidence of common femoral vein obstruction.  *See table(s) above for measurements and observations. Electronically signed by Harold Barban MD on 07/07/2019 at 11:40:10 AM.    Final    CT Angio Abd/Pel W and/or Wo Contrast  Result Date: 07/07/2019 CLINICAL DATA:  Leg vein DVT suspected EXAM: CTA ABDOMEN AND PELVIS WITHOUT AND WITH CONTRAST TECHNIQUE: Multidetector CT imaging of the abdomen and pelvis was performed using the standard protocol during bolus administration of intravenous contrast. Multiplanar reconstructed images and MIPs were obtained and reviewed to evaluate the vascular anatomy. CONTRAST:  177m OMNIPAQUE IOHEXOL 350 MG/ML SOLN COMPARISON:  11/21/2013 FINDINGS: VASCULAR Scattered aortic atherosclerosis. Normal contour and caliber of the abdominal aorta. Standard branching pattern of the abdominal aorta with solitary bilateral renal arteries. There is a Recovery type IVC filter in the infrarenal IVC. The IVC at the level of the filter and inferiorly and bilateral common iliac veins, as well as included portions of the right internal and external iliac and superior portions of the right femoral veins are somewhat expansile appearing and non-opacified on 120 second delayed phase imaging. Review of the MIP images confirms the above findings. NON-VASCULAR Lower chest: No acute abnormality. Hepatobiliary: No solid liver abnormality is seen. Hepatic steatosis. No gallstones, gallbladder wall thickening, or biliary dilatation. Pancreas: Unremarkable. No pancreatic ductal dilatation or surrounding inflammatory changes. Spleen: Normal in size  without significant abnormality. Adrenals/Urinary Tract: Adrenal glands are unremarkable. Kidneys are normal, without renal calculi, solid lesion, or hydronephrosis. Bladder is unremarkable. Stomach/Bowel: Stomach is within normal limits. Appendix appears normal. No evidence of bowel wall thickening, distention, or inflammatory changes. Lymphatic:  No enlarged abdominal or pelvic lymph nodes. Reproductive: Status post hysterectomy. Other: No abdominal wall hernia or abnormality. No abdominopelvic ascites. Musculoskeletal: No acute or significant osseous findings. IMPRESSION: 1. There is a Recovery type IVC filter in the infrarenal IVC. The IVC at the level of the filter and inferiorly and bilateral common iliac veins, as well as included portions of the right internal and external iliac and superior portions of the right femoral veins are somewhat expansile appearing and non-opacified on 120 second delayed phase imaging. Findings are consistent with acute appearing central venous and deep venous thrombosis of the IVC, bilateral common iliac veins, and the  included more distal right iliac and femoral veins. 2.  Hepatic steatosis. 3.  Aortic Atherosclerosis (ICD10-I70.0). Electronically Signed   By: Eddie Candle M.D.   On: 07/07/2019 11:49     Assessment & Plan:  Plan  I am having Emeline Gins "Bethena Roys" start on rivaroxaban and sertraline. I am also having her maintain her multivitamin, co-enzyme Q-10, CALCIUM PO, Omega-3 Fatty Acids (FISH OIL PO), glucosamine-chondroitin, cholecalciferol, simvastatin, montelukast, Rivaroxaban Stater Pack (15 mg and 20 mg), blood glucose meter kit and supplies, Pen Needles, NovoLIN N FlexPen ReliOn, and ondansetron.  Meds ordered this encounter  Medications  . rivaroxaban (XARELTO) 20 MG TABS tablet    Sig: Take 1 tablet (20 mg total) by mouth daily with supper.    Dispense:  90 tablet    Refill:  3  . sertraline (ZOLOFT) 50 MG tablet    Sig: Take 1 tablet (50 mg total) by  mouth daily.    Dispense:  30 tablet    Refill:  3    Problem List Items Addressed This Visit      Unprioritized   Anxiety    zoloft started  Take 1/2 tab for 8 days then inc to 1 po qd F/u 1 month or sooner prn       Relevant Medications   sertraline (ZOLOFT) 50 MG tablet   Hyperlipidemia LDL goal <100    Tolerating statin, encouraged heart healthy diet, avoid trans fats, minimize simple carbs and saturated fats. Increase exercise as tolerated      Relevant Medications   rivaroxaban (XARELTO) 20 MG TABS tablet   Nausea    She was told in hosp it could be from hematoma pressing on her stomach Pt has zofran She also has f/u with surgeon next month Consider GI for gastroparesis if no improvement       Pulmonary embolism (HCC)   Relevant Medications   rivaroxaban (XARELTO) 20 MG TABS tablet   Uncontrolled type 2 diabetes mellitus with hyperglycemia ([place]) - Primary    con't nph Refer to endo  Dm ed material given to pt  Samples of glucerna given to pt since her appetite is decreased and having nausea       Relevant Orders   Ambulatory referral to Endocrinology   Microalbumin / creatinine urine ratio      Follow-up: Return in about 3 months (around 10/26/2019), or anxiety.  Ann Held, DO

## 2019-07-26 NOTE — Assessment & Plan Note (Signed)
zoloft started  Take 1/2 tab for 8 days then inc to 1 po qd F/u 1 month or sooner prn

## 2019-07-26 NOTE — Assessment & Plan Note (Signed)
Tolerating statin, encouraged heart healthy diet, avoid trans fats, minimize simple carbs and saturated fats. Increase exercise as tolerated 

## 2019-07-26 NOTE — Patient Instructions (Addendum)
Carbohydrate Counting for Diabetes Mellitus, Adult  Carbohydrate counting is a method of keeping track of how many carbohydrates you eat. Eating carbohydrates naturally increases the amount of sugar (glucose) in the blood. Counting how many carbohydrates you eat helps keep your blood glucose within normal limits, which helps you manage your diabetes (diabetes mellitus). It is important to know how many carbohydrates you can safely have in each meal. This is different for every person. A diet and nutrition specialist (registered dietitian) can help you make a meal plan and calculate how many carbohydrates you should have at each meal and snack. Carbohydrates are found in the following foods:  Grains, such as breads and cereals.  Dried beans and soy products.  Starchy vegetables, such as potatoes, peas, and corn.  Fruit and fruit juices.  Milk and yogurt.  Sweets and snack foods, such as cake, cookies, candy, chips, and soft drinks. How do I count carbohydrates? There are two ways to count carbohydrates in food. You can use either of the methods or a combination of both. Reading "Nutrition Facts" on packaged food The "Nutrition Facts" list is included on the labels of almost all packaged foods and beverages in the [place] It includes:  The serving size.  Information about nutrients in each serving, including the grams (g) of carbohydrate per serving. To use the "Nutrition Facts":  Decide how many servings you will have.  Multiply the number of servings by the number of carbohydrates per serving.  The resulting number is the total amount of carbohydrates that you will be having. Learning standard serving sizes of other foods When you eat carbohydrate foods that are not packaged or do not include "Nutrition Facts" on the label, you need to measure the servings in order to count the amount of carbohydrates:  Measure the foods that you will eat with a food scale or measuring cup, if  needed.  Decide how many standard-size servings you will eat.  Multiply the number of servings by 15. Most carbohydrate-rich foods have about 15 g of carbohydrates per serving. ? For example, if you eat 8 oz (170 g) of strawberries, you will have eaten 2 servings and 30 g of carbohydrates (2 servings x 15 g = 30 g).  For foods that have more than one food mixed, such as soups and casseroles, you must count the carbohydrates in each food that is included. The following list contains standard serving sizes of common carbohydrate-rich foods. Each of these servings has about 15 g of carbohydrates:   hamburger bun or  English muffin.   oz (15 mL) syrup.   oz (14 g) jelly.  1 slice of bread.  1 six-inch tortilla.  3 oz (85 g) cooked rice or pasta.  4 oz (113 g) cooked dried beans.  4 oz (113 g) starchy vegetable, such as peas, corn, or potatoes.  4 oz (113 g) hot cereal.  4 oz (113 g) mashed potatoes or  of a large baked potato.  4 oz (113 g) canned or frozen fruit.  4 oz (120 mL) fruit juice.  4-6 crackers.  6 chicken nuggets.  6 oz (170 g) unsweetened dry cereal.  6 oz (170 g) plain fat-free yogurt or yogurt sweetened with artificial sweeteners.  8 oz (240 mL) milk.  8 oz (170 g) fresh fruit or one small piece of fruit.  24 oz (680 g) popped popcorn. Example of carbohydrate counting Sample meal  3 oz (85 g) chicken breast.  6 oz (170 g)   brown rice.  4 oz (113 g) corn.  8 oz (240 mL) milk.  8 oz (170 g) strawberries with sugar-free whipped topping. Carbohydrate calculation 1. Identify the foods that contain carbohydrates: ? Rice. ? Corn. ? Milk. ? Strawberries. 2. Calculate how many servings you have of each food: ? 2 servings rice. ? 1 serving corn. ? 1 serving milk. ? 1 serving strawberries. 3. Multiply each number of servings by 15 g: ? 2 servings rice x 15 g = 30 g. ? 1 serving corn x 15 g = 15 g. ? 1 serving milk x 15 g = 15 g. ? 1  serving strawberries x 15 g = 15 g. 4. Add together all of the amounts to find the total grams of carbohydrates eaten: ? 30 g + 15 g + 15 g + 15 g = 75 g of carbohydrates total. Summary  Carbohydrate counting is a method of keeping track of how many carbohydrates you eat.  Eating carbohydrates naturally increases the amount of sugar (glucose) in the blood.  Counting how many carbohydrates you eat helps keep your blood glucose within normal limits, which helps you manage your diabetes.  A diet and nutrition specialist (registered dietitian) can help you make a meal plan and calculate how many carbohydrates you should have at each meal and snack. This information is not intended to replace advice given to you by your health care provider. Make sure you discuss any questions you have with your health care provider. Document Revised: 09/22/2016 Document Reviewed: 08/12/2015 Elsevier Patient Education  2020 Elsevier Inc.  

## 2019-07-26 NOTE — Assessment & Plan Note (Signed)
con't nph Refer to endo  Dm ed material given to pt  Samples of glucerna given to pt since her appetite is decreased and having nausea

## 2019-07-26 NOTE — Assessment & Plan Note (Signed)
She was told in hosp it could be from hematoma pressing on her stomach Pt has zofran She also has f/u with surgeon next month Consider GI for gastroparesis if no improvement

## 2019-07-27 LAB — MICROALBUMIN / CREATININE URINE RATIO
Creatinine, Urine: 177 mg/dL (ref 20–275)
Microalb Creat Ratio: 10 mcg/mg creat (ref <30)
Microalb, Ur: 1.8 mg/dL

## 2019-07-29 DIAGNOSIS — J9601 Acute respiratory failure with hypoxia: Secondary | ICD-10-CM | POA: Diagnosis not present

## 2019-07-29 DIAGNOSIS — T82598D Other mechanical complication of other cardiac and vascular devices and implants, subsequent encounter: Secondary | ICD-10-CM | POA: Diagnosis not present

## 2019-07-29 DIAGNOSIS — I82402 Acute embolism and thrombosis of unspecified deep veins of left lower extremity: Secondary | ICD-10-CM | POA: Diagnosis not present

## 2019-07-29 DIAGNOSIS — I82401 Acute embolism and thrombosis of unspecified deep veins of right lower extremity: Secondary | ICD-10-CM | POA: Diagnosis not present

## 2019-07-29 DIAGNOSIS — E1165 Type 2 diabetes mellitus with hyperglycemia: Secondary | ICD-10-CM | POA: Diagnosis not present

## 2019-07-29 DIAGNOSIS — D62 Acute posthemorrhagic anemia: Secondary | ICD-10-CM | POA: Diagnosis not present

## 2019-07-31 DIAGNOSIS — D62 Acute posthemorrhagic anemia: Secondary | ICD-10-CM | POA: Diagnosis not present

## 2019-07-31 DIAGNOSIS — I82401 Acute embolism and thrombosis of unspecified deep veins of right lower extremity: Secondary | ICD-10-CM | POA: Diagnosis not present

## 2019-07-31 DIAGNOSIS — I82402 Acute embolism and thrombosis of unspecified deep veins of left lower extremity: Secondary | ICD-10-CM | POA: Diagnosis not present

## 2019-07-31 DIAGNOSIS — J9601 Acute respiratory failure with hypoxia: Secondary | ICD-10-CM | POA: Diagnosis not present

## 2019-07-31 DIAGNOSIS — E1165 Type 2 diabetes mellitus with hyperglycemia: Secondary | ICD-10-CM | POA: Diagnosis not present

## 2019-07-31 DIAGNOSIS — T82598D Other mechanical complication of other cardiac and vascular devices and implants, subsequent encounter: Secondary | ICD-10-CM | POA: Diagnosis not present

## 2019-08-06 DIAGNOSIS — J9601 Acute respiratory failure with hypoxia: Secondary | ICD-10-CM | POA: Diagnosis not present

## 2019-08-06 DIAGNOSIS — D62 Acute posthemorrhagic anemia: Secondary | ICD-10-CM | POA: Diagnosis not present

## 2019-08-06 DIAGNOSIS — I82402 Acute embolism and thrombosis of unspecified deep veins of left lower extremity: Secondary | ICD-10-CM | POA: Diagnosis not present

## 2019-08-06 DIAGNOSIS — I82401 Acute embolism and thrombosis of unspecified deep veins of right lower extremity: Secondary | ICD-10-CM | POA: Diagnosis not present

## 2019-08-06 DIAGNOSIS — T82598D Other mechanical complication of other cardiac and vascular devices and implants, subsequent encounter: Secondary | ICD-10-CM | POA: Diagnosis not present

## 2019-08-06 DIAGNOSIS — E1165 Type 2 diabetes mellitus with hyperglycemia: Secondary | ICD-10-CM | POA: Diagnosis not present

## 2019-08-08 DIAGNOSIS — T82598D Other mechanical complication of other cardiac and vascular devices and implants, subsequent encounter: Secondary | ICD-10-CM | POA: Diagnosis not present

## 2019-08-08 DIAGNOSIS — I82402 Acute embolism and thrombosis of unspecified deep veins of left lower extremity: Secondary | ICD-10-CM | POA: Diagnosis not present

## 2019-08-08 DIAGNOSIS — J9601 Acute respiratory failure with hypoxia: Secondary | ICD-10-CM | POA: Diagnosis not present

## 2019-08-08 DIAGNOSIS — I82401 Acute embolism and thrombosis of unspecified deep veins of right lower extremity: Secondary | ICD-10-CM | POA: Diagnosis not present

## 2019-08-08 DIAGNOSIS — E1165 Type 2 diabetes mellitus with hyperglycemia: Secondary | ICD-10-CM | POA: Diagnosis not present

## 2019-08-08 DIAGNOSIS — D62 Acute posthemorrhagic anemia: Secondary | ICD-10-CM | POA: Diagnosis not present

## 2019-08-10 ENCOUNTER — Emergency Department (HOSPITAL_COMMUNITY): Payer: Medicare Other

## 2019-08-10 ENCOUNTER — Other Ambulatory Visit: Payer: Self-pay

## 2019-08-10 ENCOUNTER — Encounter (HOSPITAL_COMMUNITY): Payer: Self-pay | Admitting: Emergency Medicine

## 2019-08-10 ENCOUNTER — Emergency Department (HOSPITAL_COMMUNITY)
Admission: EM | Admit: 2019-08-10 | Discharge: 2019-08-10 | Disposition: A | Discharge status: Home / Self Care | Payer: Medicare Other | Attending: Emergency Medicine | Admitting: Emergency Medicine

## 2019-08-10 DIAGNOSIS — Z7901 Long term (current) use of anticoagulants: Secondary | ICD-10-CM | POA: Insufficient documentation

## 2019-08-10 DIAGNOSIS — N23 Unspecified renal colic: Secondary | ICD-10-CM | POA: Diagnosis not present

## 2019-08-10 DIAGNOSIS — Z86718 Personal history of other venous thrombosis and embolism: Secondary | ICD-10-CM | POA: Diagnosis not present

## 2019-08-10 DIAGNOSIS — E119 Type 2 diabetes mellitus without complications: Secondary | ICD-10-CM | POA: Insufficient documentation

## 2019-08-10 DIAGNOSIS — Z794 Long term (current) use of insulin: Secondary | ICD-10-CM | POA: Diagnosis not present

## 2019-08-10 DIAGNOSIS — N134 Hydroureter: Secondary | ICD-10-CM | POA: Insufficient documentation

## 2019-08-10 DIAGNOSIS — N1330 Unspecified hydronephrosis: Secondary | ICD-10-CM | POA: Insufficient documentation

## 2019-08-10 DIAGNOSIS — Z86711 Personal history of pulmonary embolism: Secondary | ICD-10-CM | POA: Insufficient documentation

## 2019-08-10 DIAGNOSIS — Z85828 Personal history of other malignant neoplasm of skin: Secondary | ICD-10-CM | POA: Insufficient documentation

## 2019-08-10 DIAGNOSIS — K661 Hemoperitoneum: Secondary | ICD-10-CM | POA: Diagnosis not present

## 2019-08-10 DIAGNOSIS — R110 Nausea: Secondary | ICD-10-CM | POA: Diagnosis not present

## 2019-08-10 DIAGNOSIS — R1031 Right lower quadrant pain: Secondary | ICD-10-CM | POA: Diagnosis present

## 2019-08-10 LAB — URINALYSIS, ROUTINE W REFLEX MICROSCOPIC
Bilirubin Urine: NEGATIVE
Glucose, UA: NEGATIVE mg/dL
Ketones, ur: 5 mg/dL — AB
Leukocytes,Ua: NEGATIVE
Nitrite: NEGATIVE
Protein, ur: NEGATIVE mg/dL
RBC / HPF: >50 RBC/hpf — ABNORMAL HIGH (ref 0–5)
Specific Gravity, Urine: 1.009 (ref 1.005–1.030)
pH: 6 (ref 5.0–8.0)

## 2019-08-10 LAB — COMPREHENSIVE METABOLIC PANEL
ALT: 30 U/L (ref 0–44)
AST: 29 U/L (ref 15–41)
Albumin: 3.8 g/dL (ref 3.5–5.0)
Alkaline Phosphatase: 94 U/L (ref 38–126)
Anion gap: 11 (ref 5–15)
BUN: 18 mg/dL (ref 8–23)
CO2: 26 mmol/L (ref 22–32)
Calcium: 10 mg/dL (ref 8.9–10.3)
Chloride: 103 mmol/L (ref 98–111)
Creatinine, Ser: 0.98 mg/dL (ref 0.44–1.00)
GFR calc Af Amer: >60 mL/min (ref >60)
GFR calc non Af Amer: 60 mL/min — ABNORMAL LOW (ref >60)
Glucose, Bld: 122 mg/dL — ABNORMAL HIGH (ref 70–99)
Potassium: 4.6 mmol/L (ref 3.5–5.1)
Sodium: 140 mmol/L (ref 135–145)
Total Bilirubin: 1 mg/dL (ref 0.3–1.2)
Total Protein: 7.2 g/dL (ref 6.5–8.1)

## 2019-08-10 LAB — CBG MONITORING, ED: Glucose-Capillary: 114 mg/dL — ABNORMAL HIGH (ref 70–99)

## 2019-08-10 LAB — CBC
HCT: 42 % (ref 36.0–46.0)
Hemoglobin: 13.1 g/dL (ref 12.0–15.0)
MCH: 28.1 pg (ref 26.0–34.0)
MCHC: 31.2 g/dL (ref 30.0–36.0)
MCV: 90.1 fL (ref 80.0–100.0)
Platelets: 229 10*3/uL (ref 150–400)
RBC: 4.66 MIL/uL (ref 3.87–5.11)
RDW: 16.2 % — ABNORMAL HIGH (ref 11.5–15.5)
WBC: 6.9 10*3/uL (ref 4.0–10.5)
nRBC: 0 % (ref 0.0–0.2)

## 2019-08-10 LAB — LIPASE, BLOOD: Lipase: 41 U/L (ref 11–51)

## 2019-08-10 MED ORDER — ONDANSETRON HCL 4 MG/2ML IJ SOLN
4.0000 mg | Freq: Once | INTRAMUSCULAR | Status: DC
  Filled 2019-08-10: qty 2

## 2019-08-10 MED ORDER — IOHEXOL 350 MG/ML SOLN
100.0000 mL | Freq: Once | INTRAVENOUS | Status: AC | PRN
  Administered 2019-08-10: 100 mL via INTRAVENOUS

## 2019-08-10 MED ORDER — FENTANYL CITRATE (PF) 100 MCG/2ML IJ SOLN
75.0000 ug | Freq: Once | INTRAMUSCULAR | Status: DC
  Filled 2019-08-10: qty 2

## 2019-08-10 MED ORDER — SODIUM CHLORIDE 0.9% FLUSH: 3.0000 mL | Freq: Once | INTRAVENOUS | Status: DC

## 2019-08-10 MED ORDER — SODIUM CHLORIDE 0.9 % IV BOLUS
500.0000 mL | Freq: Once | INTRAVENOUS | Status: AC
  Administered 2019-08-10: 500 mL via INTRAVENOUS

## 2019-08-10 MED ORDER — HYDROCODONE-ACETAMINOPHEN 5-325 MG PO TABS: 1.0000 | ORAL_TABLET | ORAL | 0 refills | Status: AC | PRN

## 2019-08-10 NOTE — ED Triage Notes (Signed)
C/o severe R sided abd pain since 11:30am with nausea.  Denies vomiting and diarrhea.  States she was admitted to hospital 3 weeks ago for blood clots.

## 2019-08-10 NOTE — ED Notes (Signed)
Discharge instructions reviewed with pt. Pt verbalized understanding.   

## 2019-08-10 NOTE — Discharge Instructions (Addendum)
The CT scan showed that she most likely passed a kidney stone.  Follow-up with your doctor to have your urine rechecked.  Consider following up with a urologist if you have persistent symptoms.

## 2019-08-10 NOTE — ED Provider Notes (Signed)
[facility] EMERGENCY DEPARTMENT Provider Note   CSN: [id number] Arrival date & time: 08/10/19  1225     History Chief Complaint  Patient presents with  . Abdominal Pain    Barbara Thomas is a 69 y.o. female.  The history is provided by the patient and medical records. No language interpreter was used.  Abdominal Pain  Barbara Thomas is a 69 y.o. female who presents to the Emergency Department complaining of abdominal pain. She presents the emergency department complaining of severe and sudden onset right sided abdominal pain. Pain is described as sharp and constant in nature. She has associated nausea. She denies any fevers, chest pain, shortness of breath, vomiting, diarrhea. She does have mild dysuria and urinary frequency that started today as well. She is admitted to the hospital about one month ago for multiple DVTs and has been compliant with her Xarelto. She did have a complication during her hospitalization when an IVC filter was removed and she had a retroperitoneal hematoma. She feels like she has mostly recovered since her hospitalization. She has not missed any doses of her medications.    Past Medical History:  Diagnosis Date  . Allergy   . Arthritis   . Blood transfusion without reported diagnosis    1987  . Cancer (HCC)    Basal Cell Carcinoma  . Cataract   . Chronic kidney disease    kidney stone once  . H/O hiatal hernia   . History of basal cell carcinoma excision    NOSE  . History of benign bladder tumor   . History of DVT of lower extremity    11/ 2011  BILATERAL  POST FOOT SURGERY  . History of pulmonary embolus (PE)    12/ 2011   POST FOOT SURGERY  . Hyperlipidemia   . Left ureteral calculus   . Migraines   . OSA on CPAP    STUDY DONE 2012  . PONV (postoperative nausea and vomiting)    severe  . Sigmoid diverticulosis   . Wears glasses     Patient Active Problem List   Diagnosis Date Noted  . Anxiety 07/26/2019  . Uncontrolled  type 2 diabetes mellitus with hyperglycemia ([place]) 07/26/2019  . Nausea 07/08/2019  . Acute respiratory failure with hypoxia ([place]) 07/08/2019  . Venous thromboembolism confirmed by diagnostic testing 07/07/2019  . Diabetes mellitus type 2 in obese ([place]) 07/07/2019  . Hyperglycemia 11/27/2017  . Hyperlipidemia LDL goal <100 11/27/2017  . Seasonal allergic rhinitis due to pollen 11/27/2017  . OSA (obstructive sleep apnea) 05/22/2014  . Ureteral calculus, left 11/25/2013  . Obesity (BMI 30-39.9) 02/05/2013  . Pulmonary embolism ([place]) 06/09/2011  . GERD (gastroesophageal reflux disease) 06/03/2011  . DVT of lower extremity, bilateral ([place]) 01/10/2011  . Contusion, breast 01/10/2011    Past Surgical History:  Procedure Laterality Date  . BENIGN RIGHT BREAST BX  12-06-2010  . COLONOSCOPY    . CYSTOSCOPY WITH RETROGRADE PYELOGRAM, URETEROSCOPY AND STENT PLACEMENT Left 11/25/2013   Procedure: CYSTOSCOPY WITH RETROGRADE PYELOGRAM, URETEROSCOPY AND STENT PLACEMENT WITH COLD CUP RESECTION OF BLADDER TUMOR ;  Surgeon: Bernestine Amass, MD;  Location[facility];  Service: Urology;  Laterality: Left;  . HIP PINNING Left 1985  . HOLMIUM LASER APPLICATION Left 1/93/7902   Procedure: HOLMIUM LASER APPLICATION;  Surgeon: Bernestine Amass, MD;  Location: [facility];  Service: Urology;  Laterality: Left;  . INCONTINENCE SURGERY    . INSERTION OF VENA CAVA  FILTER  12/ 2011   ECLIPSE  . IVC VENOGRAPHY N/A 07/08/2019   Procedure: IVC Venography;  Surgeon: Waynetta Sandy, MD;  Location: [facility];  Service: Cardiovascular;  Laterality: N/A;  BIL LOWER LEGS WITH LYSIS  . ORIF LEFT ANKLE FX  11/ 2011  . PARTIAL HIP ARTHROPLASTY Left 1987  . REVISION HIP HEMIARTHROPLASTY  Left 2004  . THROMBECTOMY ILIAC ARTERY Bilateral 07/09/2019   Procedure: Bilateral ultrasound guided Cannulation of Internal Jugular,, Venogram External iliac,  common Femoral arteries,  Angioplasty of Bilateral Common femoral and iliacs, ANGIOVAC WITH CIRC ARREST x 21 minutes, VENOUS THROMBECTOMY OF IVC, Iliacs and Femoral Vein WITH IVUS with Removal of IVC Filter;  Surgeon: Waynetta Sandy, MD;  Location: [facility];  Service: Vascular;  Laterality: Bil  . VAGINAL HYSTERECTOMY  2005   W/  BILATERAL SALPINGOOPHORECTOMY AND BLADDER SLING PROCEDURE     OB History   No obstetric history on file.     Family History  Problem Relation Age of Onset  . Transient ischemic attack Mother   . Hypertension Mother   . Dementia Mother   . Atrial fibrillation Mother   . Cancer Father        BLADDER CANCER  . Hypertension Father   . Stroke Paternal Grandmother   . Sudden death Maternal Grandmother   . Diabetes Brother   . Colon cancer Neg Hx   . Colon polyps Neg Hx   . Esophageal cancer Neg Hx   . Rectal cancer Neg Hx   . Stomach cancer Neg Hx   . Clotting disorder Neg Hx     Social History   Tobacco Use  . Smoking status: Never Smoker  . Smokeless tobacco: Never Used  Substance Use Topics  . Alcohol use: No  . Drug use: No    Home Medications Prior to Admission medications   Medication Sig Start Date End Date Taking? Authorizing Provider  blood glucose meter kit and supplies KIT Dispense based on patient and insurance preference. Use up to four times daily as directed. (FOR ICD-9 250.00, 250.01). 07/16/19   Geradine Girt, DO  CALCIUM PO Take 1,200 mg by mouth 2 (two) times daily.     [provider]  cholecalciferol (VITAMIN D) 1000 units tablet Take 1 tablet (1,000 Units total) by mouth daily. Patient taking differently: Take 2,000 Units by mouth 2 (two) times daily.  01/05/17   Roma Schanz R, DO  co-enzyme Q-10 30 MG capsule Take 100 mg by mouth daily.     [provider]  glucosamine-chondroitin 500-400 MG tablet Take 1 tablet by mouth daily.     [provider]  Insulin NPH, Human,, Isophane, (NOVOLIN N FLEXPEN RELION) 100  UNIT/ML Kiwkpen Inject 12 units into the skin 2 times daily at 8AM and 10PM 07/16/19   Eulogio Bear U, DO  Insulin Pen Needle (PEN NEEDLES) 32G X 5 MM MISC Use as directed with insulin pen 07/16/19   Eulogio Bear U, DO  montelukast (SINGULAIR) 10 MG tablet TAKE 1 TABLET AT BEDTIME Patient taking differently: Take 10 mg by mouth at bedtime.  06/12/19   Ann Held, DO  Multiple Vitamin (MULTIVITAMIN) tablet Take 1 tablet by mouth daily.      [provider]  Omega-3 Fatty Acids (FISH OIL PO) Take 1,000 mg by mouth daily.     [provider]  ondansetron (ZOFRAN) 4 MG tablet Take 1 tablet (4 mg total) by mouth  daily as needed for nausea or vomiting. 07/16/19 07/15/20  Geradine Girt, DO  rivaroxaban (XARELTO) 20 MG TABS tablet Take 1 tablet (20 mg total) by mouth daily with supper. 07/26/19   Ann Held, DO  RIVAROXABAN Alveda Reasons) VTE STARTER PACK (15 & 20 MG TABLETS) Follow package directions: Take one 16m tablet by mouth twice a day. On day 22, switch to one 251mtablet once a day. Take with food. 07/16/19   VaGeradine GirtDO  sertraline (ZOLOFT) 50 MG tablet Take 1 tablet (50 mg total) by mouth daily. 07/26/19   LoAnn HeldDO  simvastatin (ZOCOR) 40 MG tablet TAKE 1 TABLET AT BEDTIME 06/12/19   LoRoma Schanz, DO    Allergies    Penicillins and Morphine and related  Review of Systems   Review of Systems  Gastrointestinal: Positive for abdominal pain.  All other systems reviewed and are negative.   Physical Exam Updated Vital Signs BP (!) 147/81 (BP Location: Left Arm)   Pulse 91   Temp 98.8 F (37.1 C) (Oral)   Resp (!) 22   Ht _0  (1.803 m)   Wt 95.7 kg   SpO2 100%   BMI 29.43 kg/m   Physical Exam Vitals and nursing note reviewed.  Constitutional:      Appearance: She is well-developed.  HENT:     Head: Normocephalic and atraumatic.  Cardiovascular:     Rate and Rhythm: Normal rate and regular rhythm.     Heart sounds:  No murmur.  Pulmonary:     Effort: Pulmonary effort is normal. No respiratory distress.     Comments: Moderate right sided abdominal tenderness without guarding or rebound. Abdominal:     Palpations: Abdomen is soft.     Tenderness: There is no abdominal tenderness. There is no guarding or rebound.  Musculoskeletal:        General: Swelling present. No tenderness.  Skin:    General: Skin is warm and dry.  Neurological:     Mental Status: She is alert and oriented to person, place, and time.  Psychiatric:        Behavior: Behavior normal.     ED Results / Procedures / Treatments   Labs (all labs ordered are listed, but only abnormal results are displayed) Labs Reviewed  COMPREHENSIVE METABOLIC PANEL - Abnormal; Notable for the following components:      Result Value   Glucose, Bld 122 (*)    GFR calc non Af Amer 60 (*)    All other components within normal limits  CBC - Abnormal; Notable for the following components:   RDW 16.2 (*)    All other components within normal limits  URINE CULTURE  LIPASE, BLOOD  URINALYSIS, ROUTINE W REFLEX MICROSCOPIC    EKG None  Radiology No results found.  Procedures Procedures (including critical care time)  Medications Ordered in ED Medications  sodium chloride flush (NS) 0.9 % injection 3 mL (has no administration in time range)  sodium chloride 0.9 % bolus 500 mL (has no administration in time range)  fentaNYL (SUBLIMAZE) injection 75 mcg (has no administration in time range)  ondansetron (ZOFRAN) injection 4 mg (has no administration in time range)    ED Course  I have reviewed the triage vital signs and the nursing notes.  Pertinent labs & imaging results that were available during my care of the patient were reviewed by me and considered in my medical decision making (see chart  for details).    MDM Rules/Calculators/A&P                     Patient with history of recent DVT currently anticoagulated here for evaluation  of severe right sided abdominal pain and nausea. She does have tenderness on examination without peritoneal findings. Plan to obtain CTA to rule out acute thrombus. Patient care transferred pending CT, UA.  Final Clinical Impression(s) / ED Diagnoses Final diagnoses:  None    Rx / DC Orders ED Discharge Orders    None       Quintella Reichert, MD 08/10/19 1539

## 2019-08-10 NOTE — ED Provider Notes (Signed)
Pt presents with abd pain.  UA pending.  CT angio pending to assess for mesenteric ischemia.  Findings were discussed with the patient.  Her urinalysis showed significant hematuria but no evidence to suggest infection.  CT scan was compatible with a recently passed kidney stone.  No signs of mesenteric ischemia.  Patient is pain-free now.  Will discharge home with pain medications.   Dorie Rank, MD 08/10/19 [phone]

## 2019-08-11 LAB — URINE CULTURE: Culture: NO GROWTH

## 2019-08-13 DIAGNOSIS — I82402 Acute embolism and thrombosis of unspecified deep veins of left lower extremity: Secondary | ICD-10-CM | POA: Diagnosis not present

## 2019-08-13 DIAGNOSIS — T82598D Other mechanical complication of other cardiac and vascular devices and implants, subsequent encounter: Secondary | ICD-10-CM | POA: Diagnosis not present

## 2019-08-13 DIAGNOSIS — I82401 Acute embolism and thrombosis of unspecified deep veins of right lower extremity: Secondary | ICD-10-CM | POA: Diagnosis not present

## 2019-08-13 DIAGNOSIS — D62 Acute posthemorrhagic anemia: Secondary | ICD-10-CM | POA: Diagnosis not present

## 2019-08-13 DIAGNOSIS — E1165 Type 2 diabetes mellitus with hyperglycemia: Secondary | ICD-10-CM | POA: Diagnosis not present

## 2019-08-13 DIAGNOSIS — J9601 Acute respiratory failure with hypoxia: Secondary | ICD-10-CM | POA: Diagnosis not present

## 2019-08-15 DIAGNOSIS — J9601 Acute respiratory failure with hypoxia: Secondary | ICD-10-CM | POA: Diagnosis not present

## 2019-08-15 DIAGNOSIS — D62 Acute posthemorrhagic anemia: Secondary | ICD-10-CM | POA: Diagnosis not present

## 2019-08-15 DIAGNOSIS — I82401 Acute embolism and thrombosis of unspecified deep veins of right lower extremity: Secondary | ICD-10-CM | POA: Diagnosis not present

## 2019-08-15 DIAGNOSIS — T82598D Other mechanical complication of other cardiac and vascular devices and implants, subsequent encounter: Secondary | ICD-10-CM | POA: Diagnosis not present

## 2019-08-15 DIAGNOSIS — I82402 Acute embolism and thrombosis of unspecified deep veins of left lower extremity: Secondary | ICD-10-CM | POA: Diagnosis not present

## 2019-08-15 DIAGNOSIS — E1165 Type 2 diabetes mellitus with hyperglycemia: Secondary | ICD-10-CM | POA: Diagnosis not present

## 2019-08-16 ENCOUNTER — Other Ambulatory Visit: Payer: Self-pay | Admitting: *Deleted

## 2019-08-16 DIAGNOSIS — I8290 Acute embolism and thrombosis of unspecified vein: Secondary | ICD-10-CM

## 2019-08-16 DIAGNOSIS — I82403 Acute embolism and thrombosis of unspecified deep veins of lower extremity, bilateral: Secondary | ICD-10-CM

## 2019-08-17 DIAGNOSIS — I82401 Acute embolism and thrombosis of unspecified deep veins of right lower extremity: Secondary | ICD-10-CM | POA: Diagnosis not present

## 2019-08-17 DIAGNOSIS — Z7901 Long term (current) use of anticoagulants: Secondary | ICD-10-CM | POA: Diagnosis not present

## 2019-08-17 DIAGNOSIS — Z86718 Personal history of other venous thrombosis and embolism: Secondary | ICD-10-CM | POA: Diagnosis not present

## 2019-08-17 DIAGNOSIS — K449 Diaphragmatic hernia without obstruction or gangrene: Secondary | ICD-10-CM | POA: Diagnosis not present

## 2019-08-17 DIAGNOSIS — Z86711 Personal history of pulmonary embolism: Secondary | ICD-10-CM | POA: Diagnosis not present

## 2019-08-17 DIAGNOSIS — S92502D Displaced unspecified fracture of left lesser toe(s), subsequent encounter for fracture with routine healing: Secondary | ICD-10-CM | POA: Diagnosis not present

## 2019-08-17 DIAGNOSIS — E669 Obesity, unspecified: Secondary | ICD-10-CM | POA: Diagnosis not present

## 2019-08-17 DIAGNOSIS — D62 Acute posthemorrhagic anemia: Secondary | ICD-10-CM | POA: Diagnosis not present

## 2019-08-17 DIAGNOSIS — J9601 Acute respiratory failure with hypoxia: Secondary | ICD-10-CM | POA: Diagnosis not present

## 2019-08-17 DIAGNOSIS — Z85828 Personal history of other malignant neoplasm of skin: Secondary | ICD-10-CM | POA: Diagnosis not present

## 2019-08-17 DIAGNOSIS — Z95828 Presence of other vascular implants and grafts: Secondary | ICD-10-CM | POA: Diagnosis not present

## 2019-08-17 DIAGNOSIS — K5730 Diverticulosis of large intestine without perforation or abscess without bleeding: Secondary | ICD-10-CM | POA: Diagnosis not present

## 2019-08-17 DIAGNOSIS — T82598D Other mechanical complication of other cardiac and vascular devices and implants, subsequent encounter: Secondary | ICD-10-CM | POA: Diagnosis not present

## 2019-08-17 DIAGNOSIS — I82402 Acute embolism and thrombosis of unspecified deep veins of left lower extremity: Secondary | ICD-10-CM | POA: Diagnosis not present

## 2019-08-17 DIAGNOSIS — Z87442 Personal history of urinary calculi: Secondary | ICD-10-CM | POA: Diagnosis not present

## 2019-08-17 DIAGNOSIS — I700 Atherosclerosis of aorta: Secondary | ICD-10-CM | POA: Diagnosis not present

## 2019-08-17 DIAGNOSIS — E1165 Type 2 diabetes mellitus with hyperglycemia: Secondary | ICD-10-CM | POA: Diagnosis not present

## 2019-08-17 DIAGNOSIS — K5900 Constipation, unspecified: Secondary | ICD-10-CM | POA: Diagnosis not present

## 2019-08-19 ENCOUNTER — Ambulatory Visit (INDEPENDENT_AMBULATORY_CARE_PROVIDER_SITE_OTHER)
Admit: 2019-08-19 | Discharge: 2019-08-19 | Disposition: A | Discharge status: Home / Self Care | Payer: Medicare Other | Attending: Surgery | Admitting: Surgery

## 2019-08-19 ENCOUNTER — Ambulatory Visit (HOSPITAL_COMMUNITY)
Admission: RE | Admit: 2019-08-19 | Discharge: 2019-08-19 | Disposition: A | Discharge status: Home / Self Care | Payer: Medicare Other | Source: Ambulatory Visit | Attending: Surgery | Admitting: Surgery

## 2019-08-19 ENCOUNTER — Encounter: Payer: Self-pay | Admitting: Surgery

## 2019-08-19 ENCOUNTER — Ambulatory Visit (INDEPENDENT_AMBULATORY_CARE_PROVIDER_SITE_OTHER): Payer: Medicare Other | Admitting: Surgery

## 2019-08-19 ENCOUNTER — Other Ambulatory Visit: Payer: Self-pay

## 2019-08-19 VITALS — BP 126/64 | HR 71 | Temp 97.4°F | Resp 20 | Ht 71.0 in | Wt 216.0 lb

## 2019-08-19 DIAGNOSIS — I8290 Acute embolism and thrombosis of unspecified vein: Secondary | ICD-10-CM | POA: Insufficient documentation

## 2019-08-19 DIAGNOSIS — I82403 Acute embolism and thrombosis of unspecified deep veins of lower extremity, bilateral: Secondary | ICD-10-CM | POA: Insufficient documentation

## 2019-08-19 NOTE — Progress Notes (Signed)
[facility] [place]  Patient name: Barbara Thomas MRN: [id number] DOB: July 01, 1950 Sex: female   REASON FOR VISIT:    Follow up  [place]:    Barbara Thomas is a 69 y.o. female who presented to the emergency department on 07/07/2019 with right leg pain and swelling.  She had recently suffered a broken toe and had not been very mobile and then had traveled to [facility] via car.  She had an ultrasound that confirmed a DVT.  She has a prior history of DVT following prolonged immobility from her ankle fracture.  At that time she had a PE and IVC filter placed.  She was ultimately taken off of anticoagulation.  On 07/08/2019 she had popliteal catheters placed for lysis overnight, and on 07/09/2019 she underwent angio VAC thrombectomy and removal of her vena cava filter.  She developed retroperitoneal bleeding from a venous collateral likely from a wire perforation.  She did require intermittent discontinuation of her anticoagulation.  Ultimately this resolved after blood transfusion.  She was discharged home on Xarelto.  She states that she is still having swelling in her right leg.  She has been unable to wear the compression stockings because of her toe fracture and having trouble getting them on.   PAST MEDICAL HISTORY:   Past Medical History:  Diagnosis Date  . Allergy   . Arthritis   . Blood transfusion without reported diagnosis    1987  . Cancer (HCC)    Basal Cell Carcinoma  . Cataract   . Chronic kidney disease    kidney stone once  . DVT (deep venous thrombosis) ([place])   . H/O hiatal hernia   . History of basal cell carcinoma excision    NOSE  . History of benign bladder tumor   . History of DVT of lower extremity    11/ 2011  BILATERAL  POST FOOT SURGERY  . History of pulmonary embolus (PE)    12/ 2011   POST FOOT SURGERY  . Hyperlipidemia   . Left ureteral calculus   . Migraines   . OSA on  CPAP    STUDY DONE 2012  . PONV (postoperative nausea and vomiting)    severe  . Sigmoid diverticulosis   . Wears glasses      FAMILY HISTORY:   Family History  Problem Relation Age of Onset  . Transient ischemic attack Mother   . Hypertension Mother   . Dementia Mother   . Atrial fibrillation Mother   . Cancer Father        BLADDER CANCER  . Hypertension Father   . Stroke Paternal Grandmother   . Sudden death Maternal Grandmother   . Diabetes Brother   . Colon cancer Neg Hx   . Colon polyps Neg Hx   . Esophageal cancer Neg Hx   . Rectal cancer Neg Hx   . Stomach cancer Neg Hx   . Clotting disorder Neg Hx     SOCIAL HISTORY:   Social History   Tobacco Use  . Smoking status: Never Smoker  . Smokeless tobacco: Never Used  Substance Use Topics  . Alcohol use: No     ALLERGIES:   Allergies  Allergen Reactions  . Penicillins Rash  . Morphine And Related Nausea And Vomiting     CURRENT MEDICATIONS:   Current Outpatient Medications  Medication Sig Dispense Refill  . blood glucose meter kit and supplies KIT Dispense based on patient and insurance  preference. Use up to four times daily as directed. (FOR ICD-9 250.00, 250.01). 1 each 0  . CALCIUM PO Take 1,200 mg by mouth 2 (two) times daily.     . cholecalciferol (VITAMIN D) 1000 units tablet Take 1 tablet (1,000 Units total) by mouth daily. (Patient taking differently: Take 2,000 Units by mouth 2 (two) times daily. ) 90 tablet 0  . co-enzyme Q-10 30 MG capsule Take 100 mg by mouth daily.     Marland Kitchen glucosamine-chondroitin 500-400 MG tablet Take 1 tablet by mouth daily.     Marland Kitchen HYDROcodone-acetaminophen (NORCO/VICODIN) 5-325 MG tablet Take 1 tablet by mouth every 4 (four) hours as needed. 12 tablet 0  . Insulin NPH, Human,, Isophane, (NOVOLIN N FLEXPEN RELION) 100 UNIT/ML Kiwkpen Inject 12 units into the skin 2 times daily at 8AM and 10PM 15 mL 1  . Insulin Pen Needle (PEN NEEDLES) 32G X 5 MM MISC Use as directed with  insulin pen 100 each 1  . montelukast (SINGULAIR) 10 MG tablet TAKE 1 TABLET AT BEDTIME (Patient taking differently: Take 10 mg by mouth at bedtime. ) 90 tablet 3  . Multiple Vitamin (MULTIVITAMIN) tablet Take 1 tablet by mouth daily.      . Omega-3 Fatty Acids (FISH OIL PO) Take 1,000 mg by mouth daily.     . ondansetron (ZOFRAN) 4 MG tablet Take 1 tablet (4 mg total) by mouth daily as needed for nausea or vomiting. 30 tablet 0  . rivaroxaban (XARELTO) 20 MG TABS tablet Take 1 tablet (20 mg total) by mouth daily with supper. 90 tablet 3  . sertraline (ZOLOFT) 50 MG tablet Take 1 tablet (50 mg total) by mouth daily. 30 tablet 3  . simvastatin (ZOCOR) 40 MG tablet TAKE 1 TABLET AT BEDTIME 30 tablet 1   No current facility-administered medications for this visit.    REVIEW OF SYSTEMS:   '[X]'  denotes positive finding, '[ ]'  denotes negative finding Cardiac  Comments:  Chest pain or chest pressure:    Shortness of breath upon exertion:    Short of breath when lying flat:    Irregular heart rhythm:        Vascular    Pain in calf, thigh, or hip brought on by ambulation:    Pain in feet at night that wakes you up from your sleep:     Blood clot in your veins:    Leg swelling:         Pulmonary    Oxygen at home:    Productive cough:     Wheezing:         Neurologic    Sudden weakness in arms or legs:     Sudden numbness in arms or legs:     Sudden onset of difficulty speaking or slurred speech:    Temporary loss of vision in one eye:     Problems with dizziness:         Gastrointestinal    Blood in stool:     Vomited blood:         Genitourinary    Burning when urinating:     Blood in urine:        Psychiatric    Major depression:         Hematologic    Bleeding problems:    Problems with blood clotting too easily:        Skin    Rashes or ulcers:        Constitutional  Fever or chills:      PHYSICAL EXAM:   Vitals:   08/19/19 1006  BP: 126/64  Pulse: 71   Resp: 20  Temp: (!) 97.4 F (36.3 C)  SpO2: 97%  Weight: 216 lb (98 kg)  Height: '5\' 11"'  (1.803 m)    GENERAL: The patient is a well-nourished female, in no acute distress. The vital signs are documented above. CARDIAC: There is a regular rate and rhythm.  VASCULAR: Right leg 1-2+ pitting edema PULMONARY: Non-labored respirations MUSCULOSKELETAL: There are no major deformities or cyanosis. NEUROLOGIC: No focal weakness or paresthesias are detected. SKIN: There are no ulcers or rashes noted. PSYCHIATRIC: The patient has a normal affect.  STUDIES:   I have reviewed the following: IVC/ILIAC: IVC/Iliac: Patent IVC, bilateral CIV, EIV. Cannot rule out partial  thrombus in the proximal left CIV.   LE VENOUS RIGHT:  - Age indeterminate thrombus in the FV. There is minimal reconstitution in  the distal portion. Thrombus in all other veins appear resolved.    LEFT:  - No evidence of DVT or superficial thrombus. Previous thrombus appears  resolved.   MEDICAL ISSUES:   I discussed with the patient that this is her second episode of DVT, both of which appear to have been provoked by prolonged activity, however since she has had 2 episodes I am reluctant to stop her anticoagulation.  I told her that I would continue her anticoagulation indefinitely however when I see you in 6 months, we will consider referral back to Dr. Marin Olp.  The patient has evidence of chronic thrombus within the right leg which explains her edema.  She has been having difficulty wearing her compression socks.  I have recommended that she try 15-20 knee-high compression and try to get a zipper to help with getting them on.    Leia Alf, MD, FACS [facility] [phone] Pager [phone]

## 2019-08-20 ENCOUNTER — Other Ambulatory Visit: Payer: Self-pay | Admitting: *Deleted

## 2019-08-20 DIAGNOSIS — I8290 Acute embolism and thrombosis of unspecified vein: Secondary | ICD-10-CM

## 2019-08-20 DIAGNOSIS — I82403 Acute embolism and thrombosis of unspecified deep veins of lower extremity, bilateral: Secondary | ICD-10-CM

## 2019-08-20 NOTE — Progress Notes (Addendum)
Name: Barbara Thomas  MRN/ DOB: [id number], 08-Dec-1950   Age/ Sex: 69 y.o., female    PCP: Carollee Herter, Alferd Apa, DO   Reason for Endocrinology Evaluation: Type 2 Diabetes Mellitus     Date of Initial Endocrinology Visit: 08/21/2019     PATIENT IDENTIFIER: Barbara Thomas is a 69 y.o. female with a past medical history of T2DM, OSA, GERD and Hx of DVT and PE. The patient presented for initial endocrinology clinic visit on 08/21/2019 for consultative assistance with her diabetes management.    HPI: Ms. Yepiz was    Diagnosed with DM in 2019 Prior Medications tried/Intolerance: as listed  Currently checking blood sugars 1 x / day,  before breakfast Hypoglycemia episodes : no             Hemoglobin A1c has ranged from 6.9%  in 2019, peaking at 11.2% in 2021. Patient required assistance for hypoglycemia: no Patient has required hospitalization within the last 1 year from hyper or hypoglycemia: no  In terms of diet, the patient eats 3 meals a day , avoids sugar-sweetened beverages , drinks glucerna.   Has not been able to tolerate meat since her discharge especially greasy.     HOME DIABETES REGIMEN: Novolin - N 12 units BID   Statin: Yes ACE-I/ARB: no Prior Diabetic Education:does not recall   GLUCOSE LOG:     DIABETIC COMPLICATIONS: Microvascular complications:     Denies: CKD, neuropathy   Last eye exam: Completed not recent   Macrovascular complications:    Denies: CAD, PVD, CVA   PAST HISTORY: Past Medical History:  Past Medical History:  Diagnosis Date  . Allergy   . Arthritis   . Blood transfusion without reported diagnosis    1987  . Cancer (HCC)    Basal Cell Carcinoma  . Cataract   . Chronic kidney disease    kidney stone once  . DVT (deep venous thrombosis) ([place])   . H/O hiatal hernia   . History of basal cell carcinoma excision    NOSE  . History of benign bladder tumor   . History of DVT of lower extremity    11/ 2011  BILATERAL  POST  FOOT SURGERY  . History of pulmonary embolus (PE)    12/ 2011   POST FOOT SURGERY  . Hyperlipidemia   . Left ureteral calculus   . Migraines   . OSA on CPAP    STUDY DONE 2012  . PONV (postoperative nausea and vomiting)    severe  . Sigmoid diverticulosis   . Wears glasses    Past Surgical History:  Past Surgical History:  Procedure Laterality Date  . BENIGN RIGHT BREAST BX  12-06-2010  . COLONOSCOPY    . CYSTOSCOPY WITH RETROGRADE PYELOGRAM, URETEROSCOPY AND STENT PLACEMENT Left 11/25/2013   Procedure: CYSTOSCOPY WITH RETROGRADE PYELOGRAM, URETEROSCOPY AND STENT PLACEMENT WITH COLD CUP RESECTION OF BLADDER TUMOR ;  Surgeon: Bernestine Amass, MD;  Location: [facility];  Service: Urology;  Laterality: Left;  . HIP PINNING Left 1985  . HOLMIUM LASER APPLICATION Left 5/99/7741   Procedure: HOLMIUM LASER APPLICATION;  Surgeon: Bernestine Amass, MD;  Location: [facility];  Service: Urology;  Laterality: Left;  . INCONTINENCE SURGERY    . INSERTION OF VENA CAVA FILTER  12/ 2011   ECLIPSE  . IVC VENOGRAPHY N/A 07/08/2019   Procedure: IVC Venography;  Surgeon: Waynetta Sandy, MD;  Location: [facility];  Service: Cardiovascular;  Laterality: N/A;  BIL LOWER LEGS WITH LYSIS  . ORIF LEFT ANKLE FX  11/ 2011  . PARTIAL HIP ARTHROPLASTY Left 1987  . REVISION HIP HEMIARTHROPLASTY  Left 2004  . THROMBECTOMY ILIAC ARTERY Bilateral 07/09/2019   Procedure: Bilateral ultrasound guided Cannulation of Internal Jugular,, Venogram External iliac,  common Femoral arteries, Angioplasty of Bilateral Common femoral and iliacs, ANGIOVAC WITH CIRC ARREST x 21 minutes, VENOUS THROMBECTOMY OF IVC, Iliacs and Femoral Vein WITH IVUS with Removal of IVC Filter;  Surgeon: Waynetta Sandy, MD;  Location: [facility];  Service: Vascular;  Laterality: Bil  . VAGINAL HYSTERECTOMY  2005   W/  BILATERAL SALPINGOOPHORECTOMY AND BLADDER SLING PROCEDURE      Social History:   reports that she has never smoked. She has never used smokeless tobacco. She reports that she does not drink alcohol or use drugs. Family History:  Family History  Problem Relation Age of Onset  . Transient ischemic attack Mother   . Hypertension Mother   . Dementia Mother   . Atrial fibrillation Mother   . Cancer Father        BLADDER CANCER  . Hypertension Father   . Stroke Paternal Grandmother   . Sudden death Maternal Grandmother   . Diabetes Brother   . Colon cancer Neg Hx   . Colon polyps Neg Hx   . Esophageal cancer Neg Hx   . Rectal cancer Neg Hx   . Stomach cancer Neg Hx   . Clotting disorder Neg Hx      HOME MEDICATIONS: Allergies as of 08/21/2019      Reactions   Penicillins Rash   Morphine And Related Nausea And Vomiting      Medication List       Accurate as of August 21, 2019  3:19 PM. If you have any questions, ask your nurse or doctor.        blood glucose meter kit and supplies Kit Dispense based on patient and insurance preference. Use up to four times daily as directed. (FOR ICD-9 250.00, 250.01).   CALCIUM PO Take 1,200 mg by mouth 2 (two) times daily.   cholecalciferol 1000 units tablet Commonly known as: VITAMIN D Take 1 tablet (1,000 Units total) by mouth daily. What changed:   how much to take  when to take this   co-enzyme Q-10 30 MG capsule Take 100 mg by mouth daily.   FISH OIL PO Take 1,000 mg by mouth daily.   glucosamine-chondroitin 500-400 MG tablet Take 1 tablet by mouth daily.   HYDROcodone-acetaminophen 5-325 MG tablet Commonly known as: NORCO/VICODIN Take 1 tablet by mouth every 4 (four) hours as needed.   montelukast 10 MG tablet Commonly known as: SINGULAIR TAKE 1 TABLET AT BEDTIME   multivitamin tablet Take 1 tablet by mouth daily.   NovoLIN N FlexPen ReliOn 100 UNIT/ML Kiwkpen Generic drug: Insulin NPH (Human) (Isophane) Inject 12 units into the skin 2 times daily at 8AM and 10PM   ondansetron 4 MG  tablet Commonly known as: Zofran Take 1 tablet (4 mg total) by mouth daily as needed for nausea or vomiting.   Pen Needles 32G X 5 MM Misc Use as directed with insulin pen   rivaroxaban 20 MG Tabs tablet Commonly known as: XARELTO Take 1 tablet (20 mg total) by mouth daily with supper.   sertraline 50 MG tablet Commonly known as: ZOLOFT Take 1 tablet (50 mg total) by mouth daily.   simvastatin 40 MG  tablet Commonly known as: ZOCOR TAKE 1 TABLET AT BEDTIME        ALLERGIES: Allergies  Allergen Reactions  . Penicillins Rash  . Morphine And Related Nausea And Vomiting     REVIEW OF SYSTEMS: A comprehensive ROS was conducted with the patient and is negative except as per HPI and below:  Review of Systems  Gastrointestinal: Positive for nausea. Negative for constipation, diarrhea and vomiting.  Genitourinary: Negative for frequency.  Neurological: Negative for tingling.  Endo/Heme/Allergies: Negative for polydipsia.      OBJECTIVE:   VITAL SIGNS: BP (!) 142/88 (BP Location: Right Arm, Patient Position: Sitting, Cuff Size: Normal)   Pulse 72   Ht _0  (1.803 m)   Wt 217 lb 6.4 oz (98.6 kg)   SpO2 98%   BMI 30.32 kg/m    PHYSICAL EXAM:  General: Pt appears well and is in NAD  HEENT:  Eyes: External eye exam normal without stare, lid lag or exophthalmos.  EOM intact.   Neck: General: Supple without adenopathy or carotid bruits. Thyroid: Thyroid size normal.  No goiter or nodules appreciated. No thyroid bruit.  Lungs: Clear with good BS bilat with no rales, rhonchi, or wheezes  Heart: RRR with normal S1 and S2 and no gallops; no murmurs; no rub  Abdomen: Normoactive bowel sounds, soft, nontender, without masses or organomegaly palpable  Extremities:  Lower extremities - No pretibial edema. No lesions.  Skin: Normal texture and temperature to palpation.  Neuro: MS is good with appropriate affect, pt is alert and Ox3     DATA REVIEWED:  Lab Results   Component Value Date   HGBA1C 11.2 (H) 07/07/2019   HGBA1C 7.2 (H) 06/21/2018   HGBA1C 6.9 (H) 11/27/2017   Lab Results  Component Value Date   MICROALBUR 1.8 07/26/2019   LDLCALC 63 07/13/2019   CREATININE 0.98 08/10/2019   Lab Results  Component Value Date   MICRALBCREAT 10 07/26/2019    Lab Results  Component Value Date   CHOL 131 07/13/2019   HDL 36 (L) 07/13/2019   LDLCALC 63 07/13/2019   LDLDIRECT 111.0 11/27/2017   TRIG 160 (H) 07/13/2019   CHOLHDL 3.6 07/13/2019        ASSESSMENT / PLAN / RECOMMENDATIONS:   1) Type 2 Diabetes Mellitus, Poorly controlled, Without complications - Most recent A1c of 11.2 %. Goal A1c < 7.0 %.    Plan: GENERAL: I have discussed with the patient the pathophysiology of diabetes. We went over the natural progression of the disease. We talked about both insulin resistance and insulin deficiency. We stressed the importance of lifestyle changes including diet and exercise. I explained the complications associated with diabetes including retinopathy, nephropathy, neuropathy as well as increased risk of cardiovascular disease. We went over the benefit seen with glycemic control.    I explained to the patient that diabetic patients are at higher than normal risk for amputations.   Pt with a lot of GI issues since hospitalization 06/2019 for PE/DVA, will avoid metformin, DPP-4 inhibitors and GLp-1 agonists.  Discussed adding an SGLT-2 inhibitor. Discussed risk of genital infections. Pt encouraged to stay hydrated   MEDICATIONS:  - Start Farxiga 5 mg, 1 tablet with Breakfast  - Decrease Novolin- N to 10 units twice a day ( breakfast and bedtime )  EDUCATION / INSTRUCTIONS:  BG monitoring instructions: Patient is instructed to check her blood sugars 1 times a day, fasting .  Call [facility] if: BG persistently < 70  or > 300. . I reviewed the Rule of 15 for the treatment of hypoglycemia in detail with the patient. Literature  supplied.   2) Diabetic complications:   Eye: Unknown to  have  diabetic retinopathy. Pt advised to have an eye exam   Neuro/ Feet: Does not have known diabetic peripheral neuropathy.  Renal: Patient does not have known baseline CKD. She is not on an ACEI/ARB at present.  3) Lipids: Patient is  On simvastatin 40 mg daily. Discussed cardiovascular benefits.     F/U in 3 months     Signed electronically by: Mack Guise, MD  [facility]  [facility] [address] [place], [place] [address] Phone: [phone] FAX: [phone]   CC: [address] [place] [address] Phone: [phone]  Fax: [phone]    Return to Endocrinology clinic as below: Future Appointments  Date Time Provider [place]  08/27/2019  3:00 PM Ann Held, DO [facility]

## 2019-08-21 ENCOUNTER — Encounter: Payer: Self-pay | Admitting: Internal Medicine

## 2019-08-21 ENCOUNTER — Telehealth: Payer: Self-pay

## 2019-08-21 ENCOUNTER — Ambulatory Visit (INDEPENDENT_AMBULATORY_CARE_PROVIDER_SITE_OTHER): Payer: Medicare Other | Admitting: Internal Medicine

## 2019-08-21 ENCOUNTER — Other Ambulatory Visit: Payer: Self-pay

## 2019-08-21 VITALS — BP 142/88 | HR 72 | Ht 71.0 in | Wt 217.4 lb

## 2019-08-21 DIAGNOSIS — E1165 Type 2 diabetes mellitus with hyperglycemia: Secondary | ICD-10-CM | POA: Diagnosis not present

## 2019-08-21 DIAGNOSIS — E1169 Type 2 diabetes mellitus with other specified complication: Secondary | ICD-10-CM | POA: Diagnosis not present

## 2019-08-21 DIAGNOSIS — E785 Hyperlipidemia, unspecified: Secondary | ICD-10-CM

## 2019-08-21 MED ORDER — NOVOLIN N FLEXPEN RELION 100 UNIT/ML ~~LOC~~ SUPN: 10.0000 [IU] | PEN_INJECTOR | Freq: Two times a day (BID) | SUBCUTANEOUS | 6 refills | Status: DC

## 2019-08-21 MED ORDER — PEN NEEDLES 32G X 5 MM MISC: 6 refills | Status: AC

## 2019-08-21 MED ORDER — DAPAGLIFLOZIN PROPANEDIOL 5 MG PO TABS: 5.0000 mg | ORAL_TABLET | Freq: Every day | ORAL | 4 refills | Status: DC

## 2019-08-21 NOTE — Telephone Encounter (Signed)
Patients pharmacy called stating that there is two sets of  Instructions for the following medicine  Insulin NPH, Human,, Isophane, (NOVOLIN N FLEXPEN RELION) 100 UNIT/ML Mayer Masker [[id number]]    Please give the pharmacy a call at this number [phone]  To advise on which instructions to use.

## 2019-08-21 NOTE — Telephone Encounter (Signed)
See below

## 2019-08-21 NOTE — Patient Instructions (Signed)
-   Start Farxiga 5 mg, 1 tablet with Breakfast  - Decrease Novolin- N to 10 units twice a day ( breakfast and bedtime )   Choose healthy, lower carb lower calorie snacks: toss salad, vegetables, cottage cheese, peanut butter, low fat cheese / string cheese, lower sodium deli meat, tuna salad or chicken salad     HOW TO TREAT LOW BLOOD SUGARS (Blood sugar LESS THAN 70 MG/DL)  Please follow the RULE OF 15 for the treatment of hypoglycemia treatment (when your (blood sugars are less than 70 mg/dL)    STEP 1: Take 15 grams of carbohydrates when your blood sugar is low, which includes:   3-4 GLUCOSE TABS  OR  3-4 OZ OF JUICE OR REGULAR SODA OR  ONE TUBE OF GLUCOSE GEL     STEP 2: RECHECK blood sugar in 15 MINUTES STEP 3: If your blood sugar is still low at the 15 minute recheck --> then, go back to STEP 1 and treat AGAIN with another 15 grams of carbohydrates.

## 2019-08-22 DIAGNOSIS — J9601 Acute respiratory failure with hypoxia: Secondary | ICD-10-CM | POA: Diagnosis not present

## 2019-08-22 DIAGNOSIS — D62 Acute posthemorrhagic anemia: Secondary | ICD-10-CM | POA: Diagnosis not present

## 2019-08-22 DIAGNOSIS — I82401 Acute embolism and thrombosis of unspecified deep veins of right lower extremity: Secondary | ICD-10-CM | POA: Diagnosis not present

## 2019-08-22 DIAGNOSIS — I82402 Acute embolism and thrombosis of unspecified deep veins of left lower extremity: Secondary | ICD-10-CM | POA: Diagnosis not present

## 2019-08-22 DIAGNOSIS — E1165 Type 2 diabetes mellitus with hyperglycemia: Secondary | ICD-10-CM | POA: Diagnosis not present

## 2019-08-22 DIAGNOSIS — T82598D Other mechanical complication of other cardiac and vascular devices and implants, subsequent encounter: Secondary | ICD-10-CM | POA: Diagnosis not present

## 2019-08-22 NOTE — Telephone Encounter (Signed)
Corrected and resent

## 2019-08-27 ENCOUNTER — Encounter: Payer: Self-pay | Admitting: Family Medicine

## 2019-08-27 ENCOUNTER — Other Ambulatory Visit: Payer: Self-pay

## 2019-08-27 ENCOUNTER — Ambulatory Visit (INDEPENDENT_AMBULATORY_CARE_PROVIDER_SITE_OTHER): Payer: Medicare Other | Admitting: Family Medicine

## 2019-08-27 VITALS — BP 120/70 | HR 87 | Temp 97.3°F | Resp 18 | Ht 71.0 in | Wt 216.0 lb

## 2019-08-27 DIAGNOSIS — I10 Essential (primary) hypertension: Secondary | ICD-10-CM | POA: Insufficient documentation

## 2019-08-27 DIAGNOSIS — E1165 Type 2 diabetes mellitus with hyperglycemia: Secondary | ICD-10-CM

## 2019-08-27 DIAGNOSIS — E1169 Type 2 diabetes mellitus with other specified complication: Secondary | ICD-10-CM

## 2019-08-27 DIAGNOSIS — F419 Anxiety disorder, unspecified: Secondary | ICD-10-CM

## 2019-08-27 DIAGNOSIS — E785 Hyperlipidemia, unspecified: Secondary | ICD-10-CM | POA: Diagnosis not present

## 2019-08-27 MED ORDER — SERTRALINE HCL 50 MG PO TABS: 50.0000 mg | ORAL_TABLET | Freq: Every day | ORAL | 3 refills | Status: DC

## 2019-08-27 MED ORDER — SIMVASTATIN 40 MG PO TABS: 40.0000 mg | ORAL_TABLET | Freq: Every day | ORAL | 1 refills | Status: DC

## 2019-08-27 NOTE — Patient Instructions (Addendum)
Carbohydrate Counting for Diabetes Mellitus, Adult  Carbohydrate counting is a method of keeping track of how many carbohydrates you eat. Eating carbohydrates naturally increases the amount of sugar (glucose) in the blood. Counting how many carbohydrates you eat helps keep your blood glucose within normal limits, which helps you manage your diabetes (diabetes mellitus). It is important to know how many carbohydrates you can safely have in each meal. This is different for every person. A diet and nutrition specialist (registered dietitian) can help you make a meal plan and calculate how many carbohydrates you should have at each meal and snack. Carbohydrates are found in the following foods:  Grains, such as breads and cereals.  Dried beans and soy products.  Starchy vegetables, such as potatoes, peas, and corn.  Fruit and fruit juices.  Milk and yogurt.  Sweets and snack foods, such as cake, cookies, candy, chips, and soft drinks. How do I count carbohydrates? There are two ways to count carbohydrates in food. You can use either of the methods or a combination of both. Reading "Nutrition Facts" on packaged food The "Nutrition Facts" list is included on the labels of almost all packaged foods and beverages in the [place] It includes:  The serving size.  Information about nutrients in each serving, including the grams (g) of carbohydrate per serving. To use the "Nutrition Facts":  Decide how many servings you will have.  Multiply the number of servings by the number of carbohydrates per serving.  The resulting number is the total amount of carbohydrates that you will be having. Learning standard serving sizes of other foods When you eat carbohydrate foods that are not packaged or do not include "Nutrition Facts" on the label, you need to measure the servings in order to count the amount of carbohydrates:  Measure the foods that you will eat with a food scale or measuring cup, if  needed.  Decide how many standard-size servings you will eat.  Multiply the number of servings by 15. Most carbohydrate-rich foods have about 15 g of carbohydrates per serving. ? For example, if you eat 8 oz (170 g) of strawberries, you will have eaten 2 servings and 30 g of carbohydrates (2 servings x 15 g = 30 g).  For foods that have more than one food mixed, such as soups and casseroles, you must count the carbohydrates in each food that is included. The following list contains standard serving sizes of common carbohydrate-rich foods. Each of these servings has about 15 g of carbohydrates:   hamburger bun or  English muffin.   oz (15 mL) syrup.   oz (14 g) jelly.  1 slice of bread.  1 six-inch tortilla.  3 oz (85 g) cooked rice or pasta.  4 oz (113 g) cooked dried beans.  4 oz (113 g) starchy vegetable, such as peas, corn, or potatoes.  4 oz (113 g) hot cereal.  4 oz (113 g) mashed potatoes or  of a large baked potato.  4 oz (113 g) canned or frozen fruit.  4 oz (120 mL) fruit juice.  4-6 crackers.  6 chicken nuggets.  6 oz (170 g) unsweetened dry cereal.  6 oz (170 g) plain fat-free yogurt or yogurt sweetened with artificial sweeteners.  8 oz (240 mL) milk.  8 oz (170 g) fresh fruit or one small piece of fruit.  24 oz (680 g) popped popcorn. Example of carbohydrate counting Sample meal  3 oz (85 g) chicken breast.  6 oz (170 g)   brown rice.  4 oz (113 g) corn.  8 oz (240 mL) milk.  8 oz (170 g) strawberries with sugar-free whipped topping. Carbohydrate calculation 1. Identify the foods that contain carbohydrates: ? Rice. ? Corn. ? Milk. ? Strawberries. 2. Calculate how many servings you have of each food: ? 2 servings rice. ? 1 serving corn. ? 1 serving milk. ? 1 serving strawberries. 3. Multiply each number of servings by 15 g: ? 2 servings rice x 15 g = 30 g. ? 1 serving corn x 15 g = 15 g. ? 1 serving milk x 15 g = 15 g. ? 1  serving strawberries x 15 g = 15 g. ?  4. Add together all of the amounts to find the total grams of carbohydrates eaten: ? 30 g + 15 g + 15 g + 15 g = 75 g of carbohydrates total. Summary  Carbohydrate counting is a method of keeping track of how many carbohydrates you eat.  Eating carbohydrates naturally increases the amount of sugar (glucose) in the blood.  Counting how many carbohydrates you eat helps keep your blood glucose within normal limits, which helps you manage your diabetes.  A diet and nutrition specialist (registered dietitian) can help you make a meal plan and calculate how many carbohydrates you should have at each meal and snack.  his information is not intended to replace advice given to you by your health care provider. Make sure you discuss any questions you have with your health care provider. Document Revised: 09/22/2016 Document Reviewed: 08/12/2015 Elsevier Patient Education  [place].

## 2019-08-27 NOTE — Assessment & Plan Note (Signed)
Stable con't zoloft 

## 2019-08-27 NOTE — Assessment & Plan Note (Signed)
Well controlled, no changes to meds. Encouraged heart healthy diet such as the DASH diet and exercise as tolerated.  °

## 2019-08-27 NOTE — Assessment & Plan Note (Signed)
Per endo °

## 2019-08-27 NOTE — Assessment & Plan Note (Signed)
Tolerating statin, encouraged heart healthy diet, avoid trans fats, minimize simple carbs and saturated fats. Increase exercise as tolerated 

## 2019-08-27 NOTE — Progress Notes (Addendum)
Patient ID: Barbara Thomas, female    DOB: 1951/02/18  Age: 69 y.o. MRN: [id number]    Subjective:  Subjective  HPI Barbara Thomas presents for dm , chol and bp.   Pt with no complaints  HYPERTENSION   Blood pressure range-not checking   Chest pain- no      Dyspnea- no Lightheadedness- no   Edema- no  Other side effects - no   Medication compliance: good Low salt diet- yes    DIABETES    Blood Sugar ranges-good -- sees endo  Polyuria- no New Visual problems- no  Hypoglycemic symptoms- no  Other side effects-no Medication compliance - good Last eye exam- due Foot exam- endo    HYPERLIPIDEMIA  Medication compliance- good  RUQ pain- no  Muscle aches- no Other side effects-no     Review of Systems  Constitutional: Negative for appetite change, diaphoresis, fatigue and unexpected weight change.  Eyes: Negative for pain, redness and visual disturbance.  Respiratory: Negative for cough, chest tightness, shortness of breath and wheezing.   Cardiovascular: Negative for chest pain, palpitations and leg swelling.  Endocrine: Negative for cold intolerance, heat intolerance, polydipsia, polyphagia and polyuria.  Genitourinary: Negative for difficulty urinating, dysuria and frequency.  Neurological: Negative for dizziness, light-headedness, numbness and headaches.    History Past Medical History:  Diagnosis Date  . Allergy   . Arthritis   . Blood transfusion without reported diagnosis    1987  . Cancer (HCC)    Basal Cell Carcinoma  . Cataract   . Chronic kidney disease    kidney stone once  . DVT (deep venous thrombosis) ([place])   . H/O hiatal hernia   . History of basal cell carcinoma excision    NOSE  . History of benign bladder tumor   . History of DVT of lower extremity    11/ 2011  BILATERAL  POST FOOT SURGERY  . History of pulmonary embolus (PE)    12/ 2011   POST FOOT SURGERY  . Hyperlipidemia   . Left ureteral calculus   . Migraines   . OSA on CPAP    STUDY  DONE 2012  . PONV (postoperative nausea and vomiting)    severe  . Sigmoid diverticulosis   . Wears glasses     She has a past surgical history that includes Hip pinning (Left, 1985); BENIGN RIGHT BREAST BX (12-06-2010); Insertion of vena cava filter (12/ 2011); ORIF LEFT ANKLE FX (11/ 2011); Partial hip arthroplasty (Left, 1987); REVISION HIP HEMIARTHROPLASTY  (Left, 2004); Cystoscopy with retrograde pyelogram, ureteroscopy and stent placement (Left, 11/25/2013); Holmium laser application (Left, 09/01/3084); Vaginal hysterectomy (2005); Colonoscopy; Incontinence surgery; IVC Venography (N/A, 07/08/2019); and Thrombectomy iliac artery (Bilateral, 07/09/2019).   Her family history includes Atrial fibrillation in her mother; Cancer in her father; Dementia in her mother; Diabetes in her brother; Hypertension in her father and mother; Stroke in her paternal grandmother; Sudden death in her maternal grandmother; Transient ischemic attack in her mother.She reports that she has never smoked. She has never used smokeless tobacco. She reports that she does not drink alcohol and does not use drugs.  Current Outpatient Medications on File Prior to Visit  Medication Sig Dispense Refill  . blood glucose meter kit and supplies KIT Dispense based on patient and insurance preference. Use up to four times daily as directed. (FOR ICD-9 250.00, 250.01). 1 each 0  . CALCIUM PO Take 1,200 mg by mouth 2 (two) times daily.     . cholecalciferol (  VITAMIN D) 1000 units tablet Take 1 tablet (1,000 Units total) by mouth daily. (Patient taking differently: Take 2,000 Units by mouth 2 (two) times daily. ) 90 tablet 0  . co-enzyme Q-10 30 MG capsule Take 100 mg by mouth daily.     . dapagliflozin propanediol (FARXIGA) 5 MG TABS tablet Take 1 tablet (5 mg total) by mouth daily before breakfast. 30 tablet 4  . glucosamine-chondroitin 500-400 MG tablet Take 1 tablet by mouth daily.     Marland Kitchen HYDROcodone-acetaminophen (NORCO/VICODIN)  5-325 MG tablet Take 1 tablet by mouth every 4 (four) hours as needed. 12 tablet 0  . Insulin NPH, Human,, Isophane, (NOVOLIN N FLEXPEN RELION) 100 UNIT/ML Kiwkpen Inject 10 Units into the skin in the morning and at bedtime. 15 mL 6  . Insulin Pen Needle (PEN NEEDLES) 32G X 5 MM MISC Use as directed with insulin pen 100 each 6  . montelukast (SINGULAIR) 10 MG tablet TAKE 1 TABLET AT BEDTIME (Patient taking differently: Take 10 mg by mouth at bedtime. ) 90 tablet 3  . Multiple Vitamin (MULTIVITAMIN) tablet Take 1 tablet by mouth daily.      . Omega-3 Fatty Acids (FISH OIL PO) Take 1,000 mg by mouth daily.     . ondansetron (ZOFRAN) 4 MG tablet Take 1 tablet (4 mg total) by mouth daily as needed for nausea or vomiting. 30 tablet 0  . rivaroxaban (XARELTO) 20 MG TABS tablet Take 1 tablet (20 mg total) by mouth daily with supper. 90 tablet 3   No current facility-administered medications on file prior to visit.     Objective:  Objective  Physical Exam Vitals and nursing note reviewed.  Constitutional:      Appearance: She is well-developed.  HENT:     Head: Normocephalic and atraumatic.  Eyes:     Conjunctiva/sclera: Conjunctivae normal.  Neck:     Thyroid: No thyromegaly.     Vascular: No carotid bruit or JVD.  Cardiovascular:     Rate and Rhythm: Normal rate and regular rhythm.     Heart sounds: Normal heart sounds. No murmur heard.   Pulmonary:     Effort: Pulmonary effort is normal. No respiratory distress.     Breath sounds: Normal breath sounds. No wheezing or rales.  Chest:     Chest wall: No tenderness.  Musculoskeletal:     Cervical back: Normal range of motion and neck supple.  Neurological:     Mental Status: She is alert and oriented to person, place, and time.    BP 120/70 (BP Location: Left Arm, Patient Position: Sitting, Cuff Size: Normal)   Pulse 87   Temp (!) 97.3 F (36.3 C) (Temporal)   Resp 18   Ht '5\' 11"'$  (1.803 m)   Wt 216 lb (98 kg)   SpO2 97%   BMI  30.13 kg/m  Wt Readings from Last 3 Encounters:  08/27/19 216 lb (98 kg)  08/21/19 217 lb 6.4 oz (98.6 kg)  08/19/19 216 lb (98 kg)     Lab Results  Component Value Date   WBC 6.9 08/10/2019   HGB 13.1 08/10/2019   HCT 42.0 08/10/2019   PLT 229 08/10/2019   GLUCOSE 122 (H) 08/10/2019   CHOL 131 07/13/2019   TRIG 160 (H) 07/13/2019   HDL 36 (L) 07/13/2019   LDLDIRECT 111.0 11/27/2017   LDLCALC 63 07/13/2019   ALT 30 08/10/2019   AST 29 08/10/2019   NA 140 08/10/2019   K 4.6 08/10/2019  CL 103 08/10/2019   CREATININE 0.98 08/10/2019   BUN 18 08/10/2019   CO2 26 08/10/2019   TSH 1.98 08/25/2015   INR 1.1 07/11/2019   HGBA1C 11.2 (H) 07/07/2019   MICROALBUR 1.8 07/26/2019    VAS [place] IVC/ILIAC (VENOUS ONLY)  Result Date: 08/19/2019 IVC/ILIAC STUDY Vascular Interventions: 07/08/19: Lysis of the IVC and bilateral CIV, EIV, CFV,                         FV.                         07/08/17: Mechanical thrombectomy of the IVC, bilateral                         CIV, EIV, CFV, FV, removal of IVC filter and venoplasty                         of the bilateral CIV, EIV, and CFV.  Comparison Study: This is the first post op exam. Performing Technologist: Ralene Cork RVT  Examination Guidelines: A complete evaluation includes B-mode imaging, spectral Doppler, color Doppler, and power Doppler as needed of all accessible portions of each vessel. Bilateral testing is considered an integral part of a complete examination. Limited examinations for reoccurring indications may be performed as noted.  IVC/Iliac Findings: +----------+------+--------+--------+    IVC    PatentThrombusComments +----------+------+--------+--------+ IVC Prox  patent                 +----------+------+--------+--------+ IVC Mid   patent                 +----------+------+--------+--------+ IVC Distalpatent                 +----------+------+--------+--------+   +---------------+---------+-----------+---------+-----------+------------------+       CIV      RT-PatentRT-ThrombusLT-PatentLT-Thrombus     Comments      +---------------+---------+-----------+---------+-----------+------------------+ Common Iliac    patent              patent               High velocity,   Prox                                                   phasic flow in the                                                        proximal left CIV.                                                         Cannot rule out  partial thrombus.  +---------------+---------+-----------+---------+-----------+------------------+ Common Iliac    patent              patent                                Mid                                                                       +---------------+---------+-----------+---------+-----------+------------------+ Common Iliac    patent              patent                                Distal                                                                    +---------------+---------+-----------+---------+-----------+------------------+  +-------------------------+---------+-----------+---------+-----------+--------+            EIV           RT-PatentRT-ThrombusLT-PatentLT-ThrombusComments +-------------------------+---------+-----------+---------+-----------+--------+ External Iliac Vein Prox  patent              patent                      +-------------------------+---------+-----------+---------+-----------+--------+ External Iliac Vein Mid   patent              patent                      +-------------------------+---------+-----------+---------+-----------+--------+ External Iliac Vein       patent              patent                      Distal                                                                     +-------------------------+---------+-----------+---------+-----------+--------+  Summary: IVC/Iliac: Patent IVC, bilateral CIV, EIV. Cannot rule out partial thrombus in the proximal left CIV.  *See table(s) above for measurements and observations.  Electronically signed by Harold Barban MD on 08/19/2019 at 4:51:37 PM.    Final    VAS [place] LOWER EXTREMITY VENOUS (DVT)  Result Date: 08/19/2019  Lower Venous DVTStudy Indications: Follow up. Other Indications: 07/08/19: Lysis of the IVC and bilateral CIV, EIV, CFV, FV.                    07/09/19: Mechanical thrombectomy of the IVC and bilateral                    CIV, EIV, CFV, and FV with  IVC filter removal and venoplaty                    of the bilateral CIV, EIV, and CFV. Comparison Study: This is the first post op exam Performing Technologist: Ralene Cork RVT  Examination Guidelines: A complete evaluation includes B-mode imaging, spectral Doppler, color Doppler, and power Doppler as needed of all accessible portions of each vessel. Bilateral testing is considered an integral part of a complete examination. Limited examinations for reoccurring indications may be performed as noted. The reflux portion of the exam is performed with the patient in reverse Trendelenburg.  +---------+---------------+---------+-----------+--------------+---------------+ RIGHT    CompressibilityPhasicitySpontaneityProperties    Thrombus Aging  +---------+---------------+---------+-----------+--------------+---------------+ CFV      Full           Yes      Yes                                      +---------+---------------+---------+-----------+--------------+---------------+ SFJ      Full                    Yes                                      +---------+---------------+---------+-----------+--------------+---------------+ FV Prox  None           No       No         rigid         Age                                                          w/compression Indeterminate   +---------+---------------+---------+-----------+--------------+---------------+ FV Mid   None           No       No         rigid         Age                                                         w/compression Indeterminate   +---------+---------------+---------+-----------+--------------+---------------+ FV DistalNone           No       No         rigid         Age                                                         w/compression Indeterminate   +---------+---------------+---------+-----------+--------------+---------------+ POP      Full           Yes      Yes                                      +---------+---------------+---------+-----------+--------------+---------------+  PTV      Full                    Yes                                      +---------+---------------+---------+-----------+--------------+---------------+ PERO     Full                    Yes                                      +---------+---------------+---------+-----------+--------------+---------------+ GSV      Full           Yes                                               +---------+---------------+---------+-----------+--------------+---------------+  +---------+---------------+---------+-----------+----------+--------------+ LEFT     CompressibilityPhasicitySpontaneityPropertiesThrombus Aging +---------+---------------+---------+-----------+----------+--------------+ CFV      Full           Yes      Yes                                 +---------+---------------+---------+-----------+----------+--------------+ SFJ      Full                    Yes                                 +---------+---------------+---------+-----------+----------+--------------+ FV Prox  Full           Yes      Yes                                 +---------+---------------+---------+-----------+----------+--------------+ FV Mid   Full            Yes      Yes                                 +---------+---------------+---------+-----------+----------+--------------+ FV DistalFull           Yes      Yes                                 +---------+---------------+---------+-----------+----------+--------------+ POP      Full           Yes      Yes                                 +---------+---------------+---------+-----------+----------+--------------+ PTV      Full                    Yes                                 +---------+---------------+---------+-----------+----------+--------------+  PERO     Full                    Yes                                 +---------+---------------+---------+-----------+----------+--------------+ GSV      Full           Yes      Yes                                 +---------+---------------+---------+-----------+----------+--------------+  Summary: RIGHT: - Age indeterminate thrombus in the FV. There is minimal reconstitution in the distal portion. Thrombus in all other veins appear resolved.  LEFT: - No evidence of DVT or superficial thrombus. Previous thrombus appears resolved.  *See table(s) above for measurements and observations. Electronically signed by Harold Barban MD on 08/19/2019 at 4:54:18 PM.    Final      Assessment & Plan:  Plan  I have changed Barbara Thomas "Judy"'s simvastatin. I am also having her maintain her multivitamin, co-enzyme Q-10, CALCIUM PO, Omega-3 Fatty Acids (FISH OIL PO), glucosamine-chondroitin, cholecalciferol, montelukast, blood glucose meter kit and supplies, ondansetron, rivaroxaban, HYDROcodone-acetaminophen, dapagliflozin propanediol, Pen Needles, NovoLIN N FlexPen ReliOn, and sertraline.  Meds ordered this encounter  Medications  . simvastatin (ZOCOR) 40 MG tablet    Sig: Take 1 tablet (40 mg total) by mouth at bedtime.    Dispense:  90 tablet    Refill:  1  . sertraline (ZOLOFT) 50 MG tablet    Sig: Take 1 tablet (50 mg  total) by mouth daily.    Dispense:  90 tablet    Refill:  3    Problem List Items Addressed This Visit      Unprioritized   Anxiety    Stable con't zoloft      Relevant Medications   sertraline (ZOLOFT) 50 MG tablet   RESOLVED: Essential hypertension    Well controlled, no changes to meds. Encouraged heart healthy diet such as the DASH diet and exercise as tolerated.       Relevant Medications   simvastatin (ZOCOR) 40 MG tablet   Other Relevant Orders   Lipid panel   Comprehensive metabolic panel   Hyperlipidemia associated with type 2 diabetes mellitus ([place])    Tolerating statin, encouraged heart healthy diet, avoid trans fats, minimize simple carbs and saturated fats. Increase exercise as tolerated      Relevant Medications   simvastatin (ZOCOR) 40 MG tablet   Hyperlipidemia LDL goal <100    Tolerating statin, encouraged heart healthy diet, avoid trans fats, minimize simple carbs and saturated fats. Increase exercise as tolerated      Relevant Medications   simvastatin (ZOCOR) 40 MG tablet   Other Relevant Orders   Comprehensive metabolic panel   Uncontrolled type 2 diabetes mellitus with hyperglycemia ([place]) - Primary    Per endo      Relevant Medications   simvastatin (ZOCOR) 40 MG tablet   Other Relevant Orders   Lipid panel   Comprehensive metabolic panel      Follow-up: Return in about 6 months (around 02/26/2020), or if symptoms worsen or fail to improve, for annual exam, fasting.  Ann Held, DO

## 2019-08-28 LAB — LIPID PANEL
Cholesterol: 179 mg/dL (ref 0–200)
HDL: 49.9 mg/dL (ref >39.00)
NonHDL: 128.67
Total CHOL/HDL Ratio: 4
Triglycerides: 217 mg/dL — ABNORMAL HIGH (ref 0.0–149.0)
VLDL: 43.4 mg/dL — ABNORMAL HIGH (ref 0.0–40.0)

## 2019-08-28 LAB — COMPREHENSIVE METABOLIC PANEL
ALT: 35 U/L (ref 0–35)
AST: 30 U/L (ref 0–37)
Albumin: 4.4 g/dL (ref 3.5–5.2)
Alkaline Phosphatase: 99 U/L (ref 39–117)
BUN: 19 mg/dL (ref 6–23)
CO2: 27 mEq/L (ref 19–32)
Calcium: 10.1 mg/dL (ref 8.4–10.5)
Chloride: 102 mEq/L (ref 96–112)
Creatinine, Ser: 0.88 mg/dL (ref 0.40–1.20)
GFR: 63.94 mL/min (ref >60.00)
Glucose, Bld: 114 mg/dL — ABNORMAL HIGH (ref 70–99)
Potassium: 4.6 mEq/L (ref 3.5–5.1)
Sodium: 137 mEq/L (ref 135–145)
Total Bilirubin: 0.5 mg/dL (ref 0.2–1.2)
Total Protein: 7.1 g/dL (ref 6.0–8.3)

## 2019-08-28 LAB — LDL CHOLESTEROL, DIRECT: Direct LDL: 102 mg/dL

## 2019-08-29 ENCOUNTER — Other Ambulatory Visit: Payer: Self-pay

## 2019-08-29 ENCOUNTER — Telehealth: Payer: Self-pay | Admitting: Family Medicine

## 2019-08-29 DIAGNOSIS — J9601 Acute respiratory failure with hypoxia: Secondary | ICD-10-CM | POA: Diagnosis not present

## 2019-08-29 DIAGNOSIS — I82401 Acute embolism and thrombosis of unspecified deep veins of right lower extremity: Secondary | ICD-10-CM | POA: Diagnosis not present

## 2019-08-29 DIAGNOSIS — E1165 Type 2 diabetes mellitus with hyperglycemia: Secondary | ICD-10-CM | POA: Diagnosis not present

## 2019-08-29 DIAGNOSIS — I82402 Acute embolism and thrombosis of unspecified deep veins of left lower extremity: Secondary | ICD-10-CM | POA: Diagnosis not present

## 2019-08-29 DIAGNOSIS — T82598D Other mechanical complication of other cardiac and vascular devices and implants, subsequent encounter: Secondary | ICD-10-CM | POA: Diagnosis not present

## 2019-08-29 DIAGNOSIS — D62 Acute posthemorrhagic anemia: Secondary | ICD-10-CM | POA: Diagnosis not present

## 2019-08-29 MED ORDER — ROSUVASTATIN CALCIUM 10 MG PO TABS
10.0000 mg | ORAL_TABLET | Freq: Every day | ORAL | 2 refills | Status: DC
Start: 2019-08-29 — End: 2019-12-11

## 2019-08-29 NOTE — Telephone Encounter (Signed)
Caller: Jeidi Call back phone number: [phone]  Patient states her medication was changed to Crestor 20mg , however, prescription Is not in pharmacy. Patient also requests a 90 day supply.    [facility] [address], [place] - [place]  [place], [place] [address]  Phone:  [phone] Fax:  [phone]

## 2019-08-29 NOTE — Telephone Encounter (Signed)
Med sent to pharmacy.

## 2019-09-02 DIAGNOSIS — E1165 Type 2 diabetes mellitus with hyperglycemia: Secondary | ICD-10-CM | POA: Diagnosis not present

## 2019-09-02 DIAGNOSIS — I82401 Acute embolism and thrombosis of unspecified deep veins of right lower extremity: Secondary | ICD-10-CM | POA: Diagnosis not present

## 2019-09-02 DIAGNOSIS — J9601 Acute respiratory failure with hypoxia: Secondary | ICD-10-CM | POA: Diagnosis not present

## 2019-09-02 DIAGNOSIS — T82598D Other mechanical complication of other cardiac and vascular devices and implants, subsequent encounter: Secondary | ICD-10-CM | POA: Diagnosis not present

## 2019-09-02 DIAGNOSIS — I82402 Acute embolism and thrombosis of unspecified deep veins of left lower extremity: Secondary | ICD-10-CM | POA: Diagnosis not present

## 2019-09-02 DIAGNOSIS — D62 Acute posthemorrhagic anemia: Secondary | ICD-10-CM | POA: Diagnosis not present

## 2019-11-26 ENCOUNTER — Ambulatory Visit (INDEPENDENT_AMBULATORY_CARE_PROVIDER_SITE_OTHER): Payer: Medicare Other | Admitting: Internal Medicine

## 2019-11-26 ENCOUNTER — Other Ambulatory Visit: Payer: Self-pay

## 2019-11-26 ENCOUNTER — Encounter: Payer: Self-pay | Admitting: Internal Medicine

## 2019-11-26 VITALS — BP 118/70 | HR 89 | Ht 71.0 in | Wt 210.6 lb

## 2019-11-26 DIAGNOSIS — E119 Type 2 diabetes mellitus without complications: Secondary | ICD-10-CM | POA: Diagnosis not present

## 2019-11-26 LAB — BASIC METABOLIC PANEL
BUN: 18 mg/dL (ref 7–25)
CO2: 23 mmol/L (ref 20–32)
Calcium: 9.5 mg/dL (ref 8.6–10.4)
Chloride: 107 mmol/L (ref 98–110)
Creat: 0.85 mg/dL (ref 0.50–0.99)
Glucose, Bld: 80 mg/dL (ref 65–99)
Potassium: 4.6 mmol/L (ref 3.5–5.3)
Sodium: 138 mmol/L (ref 135–146)

## 2019-11-26 LAB — POCT GLYCOSYLATED HEMOGLOBIN (HGB A1C): Hemoglobin A1C: 5.6 % (ref 4.0–5.6)

## 2019-11-26 NOTE — Progress Notes (Signed)
Name: Barbara Thomas  Age/ Sex: 69 y.o., female   MRN/ DOB: [id number], 1950/11/30     PCP: Ann Held, DO   Reason for Endocrinology Evaluation: Type 2 Diabetes Mellitus  Initial Endocrine Consultative Visit: 08/21/2019    PATIENT IDENTIFIER: Barbara Thomas is a 69 y.o. female with a past medical history of T2DM, OSA, GERD and Hx of DVT and PE. The patient has followed with Endocrinology clinic since 08/21/2019 for consultative assistance with management of her diabetes.  DIABETIC HISTORY:  Barbara Thomas was diagnosed with DM in 2019.She was initially started on insulin. No prior use of metformin in the past.  Her hemoglobin A1c has ranged from 6.9%  in 2019, peaking at 11.2% in 2021.  On her initial visit to our clinic , she had an A1c of 11.2 % She was on Novolin -N , we we added [place] to.  SUBJECTIVE:   During the last visit (08/21/2019): A1c 11.2%. Decreased Novolin-N and started [place]   Today (11/26/2019): Barbara Thomas is here for a follow up on diabetes.  She checks her blood sugars 1 times daily, preprandial to breakfast . The patient has not had hypoglycemic episodes since the last clinic visit.   Denies side effects to [place]:  Novolin-N 10 units BID  Farxiga 5 mg daily     Statin: Yes ACE-I/ARB:Yes   GLUCOSE LOG:  BG  102-158 mg/dL   DIABETIC COMPLICATIONS: Microvascular complications:    Denies: CKD, neuropathy  Last Eye Exam: Completed not recent   Macrovascular complications:    Denies: CAD, CVA, PVD   HISTORY:  Past Medical History:  Past Medical History:  Diagnosis Date  . Allergy   . Arthritis   . Blood transfusion without reported diagnosis    1987  . Cancer (HCC)    Basal Cell Carcinoma  . Cataract   . Chronic kidney disease    kidney stone once  . DVT (deep venous thrombosis) ([place])   . H/O hiatal hernia   . History of basal cell carcinoma excision    NOSE  . History of benign bladder tumor   . History of  DVT of lower extremity    11/ 2011  BILATERAL  POST FOOT SURGERY  . History of pulmonary embolus (PE)    12/ 2011   POST FOOT SURGERY  . Hyperlipidemia   . Left ureteral calculus   . Migraines   . OSA on CPAP    STUDY DONE 2012  . PONV (postoperative nausea and vomiting)    severe  . Sigmoid diverticulosis   . Wears glasses    Past Surgical History:  Past Surgical History:  Procedure Laterality Date  . BENIGN RIGHT BREAST BX  12-06-2010  . COLONOSCOPY    . CYSTOSCOPY WITH RETROGRADE PYELOGRAM, URETEROSCOPY AND STENT PLACEMENT Left 11/25/2013   Procedure: CYSTOSCOPY WITH RETROGRADE PYELOGRAM, URETEROSCOPY AND STENT PLACEMENT WITH COLD CUP RESECTION OF BLADDER TUMOR ;  Surgeon: Bernestine Amass, MD;  Location: [facility];  Service: Urology;  Laterality: Left;  . HIP PINNING Left 1985  . HOLMIUM LASER APPLICATION Left 0/73/7106   Procedure: HOLMIUM LASER APPLICATION;  Surgeon: Bernestine Amass, MD;  Location: [facility];  Service: Urology;  Laterality: Left;  . INCONTINENCE SURGERY    . INSERTION OF VENA CAVA FILTER  12/ 2011   ECLIPSE  . IVC VENOGRAPHY N/A 07/08/2019   Procedure: IVC Venography;  Surgeon: Waynetta Sandy, MD;  Location: [facility];  Service: Cardiovascular;  Laterality: N/A;  BIL LOWER LEGS WITH LYSIS  . ORIF LEFT ANKLE FX  11/ 2011  . PARTIAL HIP ARTHROPLASTY Left 1987  . REVISION HIP HEMIARTHROPLASTY  Left 2004  . THROMBECTOMY ILIAC ARTERY Bilateral 07/09/2019   Procedure: Bilateral ultrasound guided Cannulation of Internal Jugular,, Venogram External iliac,  common Femoral arteries, Angioplasty of Bilateral Common femoral and iliacs, ANGIOVAC WITH CIRC ARREST x 21 minutes, VENOUS THROMBECTOMY OF IVC, Iliacs and Femoral Vein WITH IVUS with Removal of IVC Filter;  Surgeon: Waynetta Sandy, MD;  Location: [facility];  Service: Vascular;  Laterality: Bil  . VAGINAL HYSTERECTOMY  2005   W/  BILATERAL SALPINGOOPHORECTOMY  AND BLADDER SLING PROCEDURE    Social History:  reports that she has never smoked. She has never used smokeless tobacco. She reports that she does not drink alcohol and does not use drugs. Family History:  Family History  Problem Relation Age of Onset  . Transient ischemic attack Mother   . Hypertension Mother   . Dementia Mother   . Atrial fibrillation Mother   . Cancer Father        BLADDER CANCER  . Hypertension Father   . Stroke Paternal Grandmother   . Sudden death Maternal Grandmother   . Diabetes Brother   . Colon cancer Neg Hx   . Colon polyps Neg Hx   . Esophageal cancer Neg Hx   . Rectal cancer Neg Hx   . Stomach cancer Neg Hx   . Clotting disorder Neg Hx      HOME MEDICATIONS: Allergies as of 11/26/2019      Reactions   Penicillins Rash   Morphine And Related Nausea And Vomiting      Medication List       Accurate as of November 26, 2019  9:29 AM. If you have any questions, ask your nurse or doctor.        blood glucose meter kit and supplies Kit Dispense based on patient and insurance preference. Use up to four times daily as directed. (FOR ICD-9 250.00, 250.01).   CALCIUM PO Take 1,200 mg by mouth 2 (two) times daily.   cholecalciferol 1000 units tablet Commonly known as: VITAMIN D Take 1 tablet (1,000 Units total) by mouth daily. What changed:   how much to take  when to take this   co-enzyme Q-10 30 MG capsule Take 100 mg by mouth daily.   dapagliflozin propanediol 5 MG Tabs tablet Commonly known as: Farxiga Take 1 tablet (5 mg total) by mouth daily before breakfast.   FISH OIL PO Take 1,000 mg by mouth daily.   glucosamine-chondroitin 500-400 MG tablet Take 1 tablet by mouth daily.   HYDROcodone-acetaminophen 5-325 MG tablet Commonly known as: NORCO/VICODIN Take 1 tablet by mouth every 4 (four) hours as needed.   montelukast 10 MG tablet Commonly known as: SINGULAIR TAKE 1 TABLET AT BEDTIME   multivitamin tablet Take 1  tablet by mouth daily.   NovoLIN N FlexPen ReliOn 100 UNIT/ML Kiwkpen Generic drug: Insulin NPH (Human) (Isophane) Inject 10 Units into the skin in the morning and at bedtime.   ondansetron 4 MG tablet Commonly known as: Zofran Take 1 tablet (4 mg total) by mouth daily as needed for nausea or vomiting.   Pen Needles 32G X 5 MM Misc Use as directed with insulin pen   rivaroxaban 20 MG Tabs tablet Commonly known as: XARELTO Take 1 tablet (20 mg total) by  mouth daily with supper.   rosuvastatin 10 MG tablet Commonly known as: Crestor Take 1 tablet (10 mg total) by mouth at bedtime.   sertraline 50 MG tablet Commonly known as: ZOLOFT Take 1 tablet (50 mg total) by mouth daily.        OBJECTIVE:   Vital Signs: BP 118/70 (BP Location: Left Arm, Patient Position: Sitting, Cuff Size: Large)   Pulse 89   Ht '5\' 11"'  (1.803 m)   Wt 210 lb 9.6 oz (95.5 kg)   SpO2 97%   BMI 29.37 kg/m   Wt Readings from Last 3 Encounters:  11/26/19 210 lb 9.6 oz (95.5 kg)  08/27/19 216 lb (98 kg)  08/21/19 217 lb 6.4 oz (98.6 kg)     Exam: General: Pt appears well and is in NAD  Neck: General: Supple without adenopathy. Thyroid: Thyroid size normal.  No goiter or nodules appreciated. No thyroid bruit.  Lungs: Clear with good BS bilat with no rales, rhonchi, or wheezes  Heart: RRR with normal S1 and S2 and no gallops; no murmurs; no rub  Abdomen: Normoactive bowel sounds, soft, nontender, without masses or organomegaly palpable  Extremities: No pretibial edema.  Neuro: MS is good with appropriate affect, pt is alert and Ox3    DM Foot Exam 11/26/2019 The skin of the feet is intact without sores or ulcerations. The pedal pulses are 1+ on right and 1+ on left. The sensation is decreased to a screening 5.07, 10 gram monofilament on the right   DATA REVIEWED:  Lab Results  Component Value Date   HGBA1C 5.6 11/26/2019   HGBA1C 11.2 (H) 07/07/2019   HGBA1C 7.2 (H) 06/21/2018   Lab  Results  Component Value Date   MICROALBUR 1.8 07/26/2019   LDLCALC 63 07/13/2019   CREATININE 0.88 08/27/2019   Lab Results  Component Value Date   MICRALBCREAT 10 07/26/2019     Lab Results  Component Value Date   CHOL 179 08/27/2019   HDL 49.90 08/27/2019   LDLCALC 63 07/13/2019   LDLDIRECT 102.0 08/27/2019   TRIG 217.0 (H) 08/27/2019   CHOLHDL 4 08/27/2019        Results for Kittel, Niki "JUDY" (MRN [id number]) as of 11/27/2019 11:05  Ref. Range 11/26/2019 09:56  Sodium Latest Ref Range: 135 - 146 mmol/L 138  Potassium Latest Ref Range: 3.5 - 5.3 mmol/L 4.6  Chloride Latest Ref Range: 98 - 110 mmol/L 107  CO2 Latest Ref Range: 20 - 32 mmol/L 23  Glucose Latest Ref Range: 65 - 99 mg/dL 80  BUN Latest Ref Range: 7 - 25 mg/dL 18  Creatinine Latest Ref Range: 0.50 - 0.99 mg/dL 0.85  Calcium Latest Ref Range: 8.6 - 10.4 mg/dL 9.5  BUN/Creatinine Ratio Latest Ref Range: 6 - 22 (calc) NOT APPLICABLE    ASSESSMENT / PLAN / RECOMMENDATIONS:   1) Type 2 Diabetes Mellitus, Optimally controlled, Without complications - Most recent A1c of 5.6 %. Goal A1c < 7.0 %.    - I have praised the pt on improved glycemic control. A1c down from 11.2%  - She is considering moving to [place] sometime next year  - She has amazing work with diet.  - Will adjust medications as below   MEDICATIONS:  Increase Farxiga 10 mg daily   Decrease Novolin-N to 10 units at bedtime   EDUCATION / INSTRUCTIONS:  BG monitoring instructions: Patient is instructed to check her blood sugars 2 times a day, fasting and bedtime .  Call [facility]  Endocrinology clinic if: BG persistently < 70  . I reviewed the Rule of 15 for the treatment of hypoglycemia in detail with the patient. Literature supplied.    2) Diabetic complications:   Eye: Does not have known diabetic retinopathy.   Neuro/ Feet: Does not have known diabetic peripheral neuropathy .   Renal: Patient does not have known baseline CKD.    F/U  in 4 months    Signed electronically by: Mack Guise, MD  [facility] Endocrinology  [facility] [address] [place], [place] [address] Phone: [phone] FAX: [phone]   CC: [address] [place] [address] Phone: [phone]  Fax: [phone]  Return to Endocrinology clinic as below: Future Appointments  Date Time Provider [place]  02/27/2020  9:40 AM Ann Held, DO [facility]

## 2019-11-26 NOTE — Patient Instructions (Addendum)
-   Increase  Farxiga 10 mg, daily  - Decrease Novolin-N to 10 units at bedtime      Check sugar at bedtime and again in the morning     HOW TO TREAT LOW BLOOD SUGARS (Blood sugar LESS THAN 70 MG/DL)  Please follow the RULE OF 15 for the treatment of hypoglycemia treatment (when your (blood sugars are less than 70 mg/dL)    STEP 1: Take 15 grams of carbohydrates when your blood sugar is low, which includes:   3-4 GLUCOSE TABS  OR  3-4 OZ OF JUICE OR REGULAR SODA OR  ONE TUBE OF GLUCOSE GEL     STEP 2: RECHECK blood sugar in 15 MINUTES STEP 3: If your blood sugar is still low at the 15 minute recheck --> then, go back to STEP 1 and treat AGAIN with another 15 grams of carbohydrates.

## 2019-11-27 ENCOUNTER — Ambulatory Visit: Payer: Medicare Other | Admitting: Internal Medicine

## 2019-11-27 MED ORDER — NOVOLIN N FLEXPEN RELION 100 UNIT/ML ~~LOC~~ SUPN: 10.0000 [IU] | PEN_INJECTOR | Freq: Every day | SUBCUTANEOUS | 6 refills | Status: AC

## 2019-11-27 MED ORDER — DAPAGLIFLOZIN PROPANEDIOL 5 MG PO TABS: 10.0000 mg | ORAL_TABLET | Freq: Every day | ORAL | 6 refills | Status: DC

## 2019-11-29 ENCOUNTER — Telehealth: Payer: Self-pay

## 2019-11-29 NOTE — Telephone Encounter (Signed)
PA initiated via [email or web address] for Farxiga 5mg  tablets.    Ausha Eveland  Key: [id number]   PA Case ID: [id number]   Rx #: [id number] Status: Sent to [employer] Drug: Wilder Glade 5MG  tablets Form: [employer] Electronic PA Form [phone] NCPDP) Original Claim Info [phone]   Geniece Outen (Key: [id number])  Your information has been submitted to [place] Medicare Part D. [employer] Part D will review the request and will issue a decision, typically within 1-3 days from your submission. You can check the updated outcome later by reopening this request.  If [employer] D has not responded in 1-3 days or if you have any questions about your ePA request, please contact [place] Medicare Part D at [phone]. If you think there may be a problem with your PA request, use our live chat feature at the bottom right.

## 2019-12-10 ENCOUNTER — Other Ambulatory Visit: Payer: Self-pay | Admitting: Family Medicine

## 2019-12-17 DIAGNOSIS — Z23 Encounter for immunization: Secondary | ICD-10-CM | POA: Diagnosis not present

## 2019-12-18 DIAGNOSIS — E119 Type 2 diabetes mellitus without complications: Secondary | ICD-10-CM | POA: Diagnosis not present

## 2019-12-18 DIAGNOSIS — H2513 Age-related nuclear cataract, bilateral: Secondary | ICD-10-CM | POA: Diagnosis not present

## 2020-01-05 DIAGNOSIS — Z23 Encounter for immunization: Secondary | ICD-10-CM | POA: Diagnosis not present

## 2020-02-24 LAB — HM MAMMOGRAPHY

## 2020-02-27 ENCOUNTER — Ambulatory Visit (INDEPENDENT_AMBULATORY_CARE_PROVIDER_SITE_OTHER): Payer: Medicare Other | Admitting: Family Medicine

## 2020-02-27 ENCOUNTER — Other Ambulatory Visit: Payer: Self-pay

## 2020-02-27 ENCOUNTER — Encounter: Payer: Self-pay | Admitting: Family Medicine

## 2020-02-27 VITALS — BP 108/80 | HR 81 | Temp 97.9°F | Resp 18 | Ht 71.0 in | Wt 208.0 lb

## 2020-02-27 DIAGNOSIS — E1169 Type 2 diabetes mellitus with other specified complication: Secondary | ICD-10-CM | POA: Diagnosis not present

## 2020-02-27 DIAGNOSIS — I2699 Other pulmonary embolism without acute cor pulmonale: Secondary | ICD-10-CM

## 2020-02-27 DIAGNOSIS — E785 Hyperlipidemia, unspecified: Secondary | ICD-10-CM

## 2020-02-27 DIAGNOSIS — Z23 Encounter for immunization: Secondary | ICD-10-CM

## 2020-02-27 DIAGNOSIS — I824Y3 Acute embolism and thrombosis of unspecified deep veins of proximal lower extremity, bilateral: Secondary | ICD-10-CM | POA: Diagnosis not present

## 2020-02-27 DIAGNOSIS — J301 Allergic rhinitis due to pollen: Secondary | ICD-10-CM | POA: Diagnosis not present

## 2020-02-27 DIAGNOSIS — E669 Obesity, unspecified: Secondary | ICD-10-CM | POA: Diagnosis not present

## 2020-02-27 DIAGNOSIS — I10 Essential (primary) hypertension: Secondary | ICD-10-CM

## 2020-02-27 LAB — COMPREHENSIVE METABOLIC PANEL
ALT: 18 U/L (ref 0–35)
AST: 17 U/L (ref 0–37)
Albumin: 4.3 g/dL (ref 3.5–5.2)
Alkaline Phosphatase: 76 U/L (ref 39–117)
BUN: 21 mg/dL (ref 6–23)
CO2: 29 mEq/L (ref 19–32)
Calcium: 9.3 mg/dL (ref 8.4–10.5)
Chloride: 103 mEq/L (ref 96–112)
Creatinine, Ser: 0.84 mg/dL (ref 0.40–1.20)
GFR: 71.46 mL/min (ref >60.00)
Glucose, Bld: 84 mg/dL (ref 70–99)
Potassium: 4.2 mEq/L (ref 3.5–5.1)
Sodium: 139 mEq/L (ref 135–145)
Total Bilirubin: 0.6 mg/dL (ref 0.2–1.2)
Total Protein: 6.9 g/dL (ref 6.0–8.3)

## 2020-02-27 LAB — LIPID PANEL
Cholesterol: 142 mg/dL (ref 0–200)
HDL: 57.6 mg/dL (ref >39.00)
LDL Cholesterol: 65 mg/dL (ref 0–99)
NonHDL: 83.93
Total CHOL/HDL Ratio: 2
Triglycerides: 93 mg/dL (ref 0.0–149.0)
VLDL: 18.6 mg/dL (ref 0.0–40.0)

## 2020-02-27 LAB — MICROALBUMIN / CREATININE URINE RATIO
Creatinine,U: 98.5 mg/dL
Microalb Creat Ratio: 0.7 mg/g (ref 0.0–30.0)
Microalb, Ur: <0.7 mg/dL (ref 0.0–1.9)

## 2020-02-27 MED ORDER — MONTELUKAST SODIUM 10 MG PO TABS: 10.0000 mg | ORAL_TABLET | Freq: Every day | ORAL | 1 refills | Status: DC

## 2020-02-27 NOTE — Assessment & Plan Note (Signed)
On xaralto 

## 2020-02-27 NOTE — Assessment & Plan Note (Signed)
Encouraged heart healthy diet, increase exercise, avoid trans fats, consider a krill oil cap daily 

## 2020-02-27 NOTE — Patient Instructions (Signed)
DASH Eating Plan DASH stands for "Dietary Approaches to Stop Hypertension." The DASH eating plan is a healthy eating plan that has been shown to reduce high blood pressure (hypertension). It may also reduce your risk for type 2 diabetes, heart disease, and stroke. The DASH eating plan may also help with weight loss. What are tips for following this plan?  General guidelines  Avoid eating more than 2,300 mg (milligrams) of salt (sodium) a day. If you have hypertension, you may need to reduce your sodium intake to 1,500 mg a day.  Limit alcohol intake to no more than 1 drink a day for nonpregnant women and 2 drinks a day for men. One drink equals 12 oz of beer, 5 oz of wine, or 1 oz of hard liquor.  Work with your health care provider to maintain a healthy body weight or to lose weight. Ask what an ideal weight is for you.  Get at least 30 minutes of exercise that causes your heart to beat faster (aerobic exercise) most days of the week. Activities may include walking, swimming, or biking.  Work with your health care provider or diet and nutrition specialist (dietitian) to adjust your eating plan to your individual calorie needs. Reading food labels   Check food labels for the amount of sodium per serving. Choose foods with less than 5 percent of the Daily Value of sodium. Generally, foods with less than 300 mg of sodium per serving fit into this eating plan.  To find whole grains, look for the word "whole" as the first word in the ingredient list. Shopping  Buy products labeled as "low-sodium" or "no salt added."  Buy fresh foods. Avoid canned foods and premade or frozen meals. Cooking  Avoid adding salt when cooking. Use salt-free seasonings or herbs instead of table salt or sea salt. Check with your health care provider or pharmacist before using salt substitutes.  Do not fry foods. Cook foods using healthy methods such as baking, boiling, grilling, and broiling instead.  Cook with  heart-healthy oils, such as olive, canola, soybean, or sunflower oil. Meal planning  Eat a balanced diet that includes: ? 5 or more servings of fruits and vegetables each day. At each meal, try to fill half of your plate with fruits and vegetables. ? Up to 6-8 servings of whole grains each day. ? Less than 6 oz of lean meat, poultry, or fish each day. A 3-oz serving of meat is about the same size as a deck of cards. One egg equals 1 oz. ? 2 servings of low-fat dairy each day. ? A serving of nuts, seeds, or beans 5 times each week. ? Heart-healthy fats. Healthy fats called Omega-3 fatty acids are found in foods such as flaxseeds and coldwater fish, like sardines, salmon, and mackerel.  Limit how much you eat of the following: ? Canned or prepackaged foods. ? Food that is high in trans fat, such as fried foods. ? Food that is high in saturated fat, such as fatty meat. ? Sweets, desserts, sugary drinks, and other foods with added sugar. ? Full-fat dairy products.  Do not salt foods before eating.  Try to eat at least 2 vegetarian meals each week.  Eat more home-cooked food and less restaurant, buffet, and fast food.  When eating at a restaurant, ask that your food be prepared with less salt or no salt, if possible. What foods are recommended? The items listed may not be a complete list. Talk with your dietitian about   what dietary choices are best for you. Grains Whole-grain or whole-wheat bread. Whole-grain or whole-wheat pasta. Brown rice. Oatmeal. Quinoa. Bulgur. Whole-grain and low-sodium cereals. Pita bread. Low-fat, low-sodium crackers. Whole-wheat flour tortillas. Vegetables Fresh or frozen vegetables (raw, steamed, roasted, or grilled). Low-sodium or reduced-sodium tomato and vegetable juice. Low-sodium or reduced-sodium tomato sauce and tomato paste. Low-sodium or reduced-sodium canned vegetables. Fruits All fresh, dried, or frozen fruit. Canned fruit in natural juice (without  added sugar). Meat and other protein foods Skinless chicken or turkey. Ground chicken or turkey. Pork with fat trimmed off. Fish and seafood. Egg whites. Dried beans, peas, or lentils. Unsalted nuts, nut butters, and seeds. Unsalted canned beans. Lean cuts of beef with fat trimmed off. Low-sodium, lean deli meat. Dairy Low-fat (1%) or fat-free (skim) milk. Fat-free, low-fat, or reduced-fat cheeses. Nonfat, low-sodium ricotta or cottage cheese. Low-fat or nonfat yogurt. Low-fat, low-sodium cheese. Fats and oils Soft margarine without trans fats. Vegetable oil. Low-fat, reduced-fat, or light mayonnaise and salad dressings (reduced-sodium). Canola, safflower, olive, soybean, and sunflower oils. Avocado. Seasoning and other foods Herbs. Spices. Seasoning mixes without salt. Unsalted popcorn and pretzels. Fat-free sweets. What foods are not recommended? The items listed may not be a complete list. Talk with your dietitian about what dietary choices are best for you. Grains Baked goods made with fat, such as croissants, muffins, or some breads. Dry pasta or rice meal packs. Vegetables Creamed or fried vegetables. Vegetables in a cheese sauce. Regular canned vegetables (not low-sodium or reduced-sodium). Regular canned tomato sauce and paste (not low-sodium or reduced-sodium). Regular tomato and vegetable juice (not low-sodium or reduced-sodium). Pickles. Olives. Fruits Canned fruit in a light or heavy syrup. Fried fruit. Fruit in cream or butter sauce. Meat and other protein foods Fatty cuts of meat. Ribs. Fried meat. Bacon. Sausage. Bologna and other processed lunch meats. Salami. Fatback. Hotdogs. Bratwurst. Salted nuts and seeds. Canned beans with added salt. Canned or smoked fish. Whole eggs or egg yolks. Chicken or turkey with skin. Dairy Whole or 2% milk, cream, and half-and-half. Whole or full-fat cream cheese. Whole-fat or sweetened yogurt. Full-fat cheese. Nondairy creamers. Whipped toppings.  Processed cheese and cheese spreads. Fats and oils Butter. Stick margarine. Lard. Shortening. Ghee. Bacon fat. Tropical oils, such as coconut, palm kernel, or palm oil. Seasoning and other foods Salted popcorn and pretzels. Onion salt, garlic salt, seasoned salt, table salt, and sea salt. Worcestershire sauce. Tartar sauce. Barbecue sauce. Teriyaki sauce. Soy sauce, including reduced-sodium. Steak sauce. Canned and packaged gravies. Fish sauce. Oyster sauce. Cocktail sauce. Horseradish that you find on the shelf. Ketchup. Mustard. Meat flavorings and tenderizers. Bouillon cubes. Hot sauce and Tabasco sauce. Premade or packaged marinades. Premade or packaged taco seasonings. Relishes. Regular salad dressings. Where to find more information:  National Heart, Lung, and Blood Institute: www.nhlbi.nih.gov  American Heart Association: www.heart.org Summary  The DASH eating plan is a healthy eating plan that has been shown to reduce high blood pressure (hypertension). It may also reduce your risk for type 2 diabetes, heart disease, and stroke.  With the DASH eating plan, you should limit salt (sodium) intake to 2,300 mg a day. If you have hypertension, you may need to reduce your sodium intake to 1,500 mg a day.  When on the DASH eating plan, aim to eat more fresh fruits and vegetables, whole grains, lean proteins, low-fat dairy, and heart-healthy fats.  Work with your health care provider or diet and nutrition specialist (dietitian) to adjust your eating plan to your   individual calorie needs. This information is not intended to replace advice given to you by your health care provider. Make sure you discuss any questions you have with your health care provider. Document Revised: 02/10/2017 Document Reviewed: 02/22/2016 Elsevier Patient Education  2020 [employer].  

## 2020-02-27 NOTE — Assessment & Plan Note (Signed)
Per endo °

## 2020-02-27 NOTE — Progress Notes (Signed)
Patient ID: Barbara Thomas, female    DOB: 28-Apr-1950  Age: 69 y.o. MRN: [id number]    Subjective:  Subjective  HPI  Barbara Thomas presents for f/u pt and chol    No complaints Pt will be moving to [place] in the new year.  Her husband retires in Feb.   Review of Systems  Constitutional: Negative for activity change, appetite change, diaphoresis, fatigue and unexpected weight change.  Eyes: Negative for pain, redness and visual disturbance.  Respiratory: Negative for cough, chest tightness, shortness of breath and wheezing.   Cardiovascular: Negative for chest pain, palpitations and leg swelling.  Endocrine: Negative for cold intolerance, heat intolerance, polydipsia, polyphagia and polyuria.  Genitourinary: Negative for difficulty urinating, dysuria and frequency.  Neurological: Negative for dizziness, light-headedness, numbness and headaches.  Psychiatric/Behavioral: Negative for behavioral problems and dysphoric mood. The patient is not nervous/anxious.     History Past Medical History:  Diagnosis Date  . Allergy   . Arthritis   . Blood transfusion without reported diagnosis    1987  . Cancer (HCC)    Basal Cell Carcinoma  . Cataract   . Chronic kidney disease    kidney stone once  . DVT (deep venous thrombosis) ([place])   . H/O hiatal hernia   . History of basal cell carcinoma excision    NOSE  . History of benign bladder tumor   . History of DVT of lower extremity    11/ 2011  BILATERAL  POST FOOT SURGERY  . History of pulmonary embolus (PE)    12/ 2011   POST FOOT SURGERY  . Hyperlipidemia   . Left ureteral calculus   . Migraines   . OSA on CPAP    STUDY DONE 2012  . PONV (postoperative nausea and vomiting)    severe  . Sigmoid diverticulosis   . Wears glasses     She has a past surgical history that includes Hip pinning (Left, 1985); BENIGN RIGHT BREAST BX (12-06-2010); Insertion of vena cava filter (12/ 2011); ORIF LEFT ANKLE FX (11/ 2011); Partial hip arthroplasty  (Left, 1987); REVISION HIP HEMIARTHROPLASTY  (Left, 2004); Cystoscopy with retrograde pyelogram, ureteroscopy and stent placement (Left, 11/25/2013); Holmium laser application (Left, 06/20/8117); Vaginal hysterectomy (2005); Colonoscopy; Incontinence surgery; IVC Venography (N/A, 07/08/2019); and Thrombectomy iliac artery (Bilateral, 07/09/2019).   Her family history includes Atrial fibrillation in her mother; Cancer in her father; Dementia in her mother; Diabetes in her brother; Hypertension in her father and mother; Stroke in her paternal grandmother; Sudden death in her maternal grandmother; Transient ischemic attack in her mother.She reports that she has never smoked. She has never used smokeless tobacco. She reports that she does not drink alcohol and does not use drugs.  Current Outpatient Medications on File Prior to Visit  Medication Sig Dispense Refill  . blood glucose meter kit and supplies KIT Dispense based on patient and insurance preference. Use up to four times daily as directed. (FOR ICD-9 250.00, 250.01). 1 each 0  . CALCIUM PO Take 1,200 mg by mouth 2 (two) times daily.     . cholecalciferol (VITAMIN D) 1000 units tablet Take 1 tablet (1,000 Units total) by mouth daily. (Patient taking differently: Take 2,000 Units by mouth 2 (two) times daily.) 90 tablet 0  . co-enzyme Q-10 30 MG capsule Take 100 mg by mouth daily.     . dapagliflozin propanediol (FARXIGA) 5 MG TABS tablet Take 2 tablets (10 mg total) by mouth daily before breakfast. 30 tablet 6  .  glucosamine-chondroitin 500-400 MG tablet Take 1 tablet by mouth daily.     Marland Kitchen HYDROcodone-acetaminophen (NORCO/VICODIN) 5-325 MG tablet Take 1 tablet by mouth every 4 (four) hours as needed. 12 tablet 0  . Insulin NPH, Human,, Isophane, (NOVOLIN N FLEXPEN RELION) 100 UNIT/ML Kiwkpen Inject 10 Units into the skin at bedtime. 15 mL 6  . Insulin Pen Needle (PEN NEEDLES) 32G X 5 MM MISC Use as directed with insulin pen 100 each 6  . Multiple  Vitamin (MULTIVITAMIN) tablet Take 1 tablet by mouth daily.    . Omega-3 Fatty Acids (FISH OIL PO) Take 1,000 mg by mouth daily.     . rivaroxaban (XARELTO) 20 MG TABS tablet Take 1 tablet (20 mg total) by mouth daily with supper. 90 tablet 3  . rosuvastatin (CRESTOR) 10 MG tablet Take 1 tablet (10 mg total) by mouth at bedtime. 90 tablet 1  . sertraline (ZOLOFT) 50 MG tablet Take 1 tablet (50 mg total) by mouth daily. 90 tablet 3   No current facility-administered medications on file prior to visit.     Objective:  Objective  Physical Exam Vitals and nursing note reviewed.  Constitutional:      Appearance: She is well-developed and well-nourished.  HENT:     Head: Normocephalic and atraumatic.  Eyes:     Extraocular Movements: EOM normal.     Conjunctiva/sclera: Conjunctivae normal.  Neck:     Thyroid: No thyromegaly.     Vascular: No carotid bruit or JVD.  Cardiovascular:     Rate and Rhythm: Normal rate and regular rhythm.     Heart sounds: Normal heart sounds. No murmur heard.   Pulmonary:     Effort: Pulmonary effort is normal. No respiratory distress.     Breath sounds: Normal breath sounds. No wheezing or rales.  Chest:     Chest wall: No tenderness.  Musculoskeletal:        General: No edema.     Cervical back: Normal range of motion and neck supple.  Neurological:     Mental Status: She is alert and oriented to person, place, and time.  Psychiatric:        Mood and Affect: Mood and affect normal.    BP 108/80 (BP Location: Right Arm, Patient Position: Sitting, Cuff Size: Normal)   Pulse 81   Temp 97.9 F (36.6 C) (Oral)   Resp 18   Ht _0  (1.803 m)   Wt 208 lb (94.3 kg)   SpO2 97%   BMI 29.01 kg/m  Wt Readings from Last 3 Encounters:  02/27/20 208 lb (94.3 kg)  11/26/19 210 lb 9.6 oz (95.5 kg)  08/27/19 216 lb (98 kg)     Lab Results  Component Value Date   WBC 6.9 08/10/2019   HGB 13.1 08/10/2019   HCT 42.0 08/10/2019   PLT 229 08/10/2019    GLUCOSE 80 11/26/2019   CHOL 179 08/27/2019   TRIG 217.0 (H) 08/27/2019   HDL 49.90 08/27/2019   LDLDIRECT 102.0 08/27/2019   LDLCALC 63 07/13/2019   ALT 35 08/27/2019   AST 30 08/27/2019   NA 138 11/26/2019   K 4.6 11/26/2019   CL 107 11/26/2019   CREATININE 0.85 11/26/2019   BUN 18 11/26/2019   CO2 23 11/26/2019   TSH 1.98 08/25/2015   INR 1.1 07/11/2019   HGBA1C 5.6 11/26/2019   MICROALBUR 1.8 07/26/2019    VAS [place] IVC/ILIAC (VENOUS ONLY)  Result Date: 08/19/2019 IVC/ILIAC STUDY Vascular Interventions: 07/08/19:  Lysis of the IVC and bilateral CIV, EIV, CFV,                         FV.                         07/08/17: Mechanical thrombectomy of the IVC, bilateral                         CIV, EIV, CFV, FV, removal of IVC filter and venoplasty                         of the bilateral CIV, EIV, and CFV.  Comparison Study: This is the first post op exam. Performing Technologist: Ralene Cork RVT  Examination Guidelines: A complete evaluation includes B-mode imaging, spectral Doppler, color Doppler, and power Doppler as needed of all accessible portions of each vessel. Bilateral testing is considered an integral part of a complete examination. Limited examinations for reoccurring indications may be performed as noted.  IVC/Iliac Findings: +----------+------+--------+--------+    IVC    PatentThrombusComments +----------+------+--------+--------+ IVC Prox  patent                 +----------+------+--------+--------+ IVC Mid   patent                 +----------+------+--------+--------+ IVC Distalpatent                 +----------+------+--------+--------+  +---------------+---------+-----------+---------+-----------+------------------+       CIV      RT-PatentRT-ThrombusLT-PatentLT-Thrombus     Comments      +---------------+---------+-----------+---------+-----------+------------------+ Common Iliac    patent              patent               High velocity,    Prox                                                   phasic flow in the                                                        proximal left CIV.                                                         Cannot rule out                                                          partial thrombus.  +---------------+---------+-----------+---------+-----------+------------------+ Common Iliac    patent              patent  Mid                                                                       +---------------+---------+-----------+---------+-----------+------------------+ Common Iliac    patent              patent                                Distal                                                                    +---------------+---------+-----------+---------+-----------+------------------+  +-------------------------+---------+-----------+---------+-----------+--------+            EIV           RT-PatentRT-ThrombusLT-PatentLT-ThrombusComments +-------------------------+---------+-----------+---------+-----------+--------+ External Iliac Vein Prox  patent              patent                      +-------------------------+---------+-----------+---------+-----------+--------+ External Iliac Vein Mid   patent              patent                      +-------------------------+---------+-----------+---------+-----------+--------+ External Iliac Vein       patent              patent                      Distal                                                                    +-------------------------+---------+-----------+---------+-----------+--------+  Summary: IVC/Iliac: Patent IVC, bilateral CIV, EIV. Cannot rule out partial thrombus in the proximal left CIV.  *See table(s) above for measurements and observations.  Electronically signed by Harold Barban MD on 08/19/2019 at 4:51:37 PM.    Final     VAS [place] LOWER EXTREMITY VENOUS (DVT)  Result Date: 08/19/2019  Lower Venous DVTStudy Indications: Follow up. Other Indications: 07/08/19: Lysis of the IVC and bilateral CIV, EIV, CFV, FV.                    07/09/19: Mechanical thrombectomy of the IVC and bilateral                    CIV, EIV, CFV, and FV with IVC filter removal and venoplaty                    of the bilateral CIV, EIV, and CFV. Comparison Study: This is the first post op exam Performing Technologist: Ralene Cork RVT  Examination Guidelines: A complete evaluation includes B-mode imaging, spectral  Doppler, color Doppler, and power Doppler as needed of all accessible portions of each vessel. Bilateral testing is considered an integral part of a complete examination. Limited examinations for reoccurring indications may be performed as noted. The reflux portion of the exam is performed with the patient in reverse Trendelenburg.  +---------+---------------+---------+-----------+--------------+---------------+ RIGHT    CompressibilityPhasicitySpontaneityProperties    Thrombus Aging  +---------+---------------+---------+-----------+--------------+---------------+ CFV      Full           Yes      Yes                                      +---------+---------------+---------+-----------+--------------+---------------+ SFJ      Full                    Yes                                      +---------+---------------+---------+-----------+--------------+---------------+ FV Prox  None           No       No         rigid         Age                                                         w/compression Indeterminate   +---------+---------------+---------+-----------+--------------+---------------+ FV Mid   None           No       No         rigid         Age                                                         w/compression Indeterminate    +---------+---------------+---------+-----------+--------------+---------------+ FV DistalNone           No       No         rigid         Age                                                         w/compression Indeterminate   +---------+---------------+---------+-----------+--------------+---------------+ POP      Full           Yes      Yes                                      +---------+---------------+---------+-----------+--------------+---------------+ PTV      Full                    Yes                                      +---------+---------------+---------+-----------+--------------+---------------+  PERO     Full                    Yes                                      +---------+---------------+---------+-----------+--------------+---------------+ GSV      Full           Yes                                               +---------+---------------+---------+-----------+--------------+---------------+  +---------+---------------+---------+-----------+----------+--------------+ LEFT     CompressibilityPhasicitySpontaneityPropertiesThrombus Aging +---------+---------------+---------+-----------+----------+--------------+ CFV      Full           Yes      Yes                                 +---------+---------------+---------+-----------+----------+--------------+ SFJ      Full                    Yes                                 +---------+---------------+---------+-----------+----------+--------------+ FV Prox  Full           Yes      Yes                                 +---------+---------------+---------+-----------+----------+--------------+ FV Mid   Full           Yes      Yes                                 +---------+---------------+---------+-----------+----------+--------------+ FV DistalFull           Yes      Yes                                  +---------+---------------+---------+-----------+----------+--------------+ POP      Full           Yes      Yes                                 +---------+---------------+---------+-----------+----------+--------------+ PTV      Full                    Yes                                 +---------+---------------+---------+-----------+----------+--------------+ PERO     Full                    Yes                                 +---------+---------------+---------+-----------+----------+--------------+ GSV  Full           Yes      Yes                                 +---------+---------------+---------+-----------+----------+--------------+  Summary: RIGHT: - Age indeterminate thrombus in the FV. There is minimal reconstitution in the distal portion. Thrombus in all other veins appear resolved.  LEFT: - No evidence of DVT or superficial thrombus. Previous thrombus appears resolved.  *See table(s) above for measurements and observations. Electronically signed by Harold Barban MD on 08/19/2019 at 4:54:18 PM.    Final      Assessment & Plan:  Plan  I have changed Barbara Thomas "Judy"'s montelukast. I am also having her maintain her multivitamin, co-enzyme Q-10, CALCIUM PO, Omega-3 Fatty Acids (FISH OIL PO), glucosamine-chondroitin, cholecalciferol, blood glucose meter kit and supplies, rivaroxaban, HYDROcodone-acetaminophen, Pen Needles, sertraline, NovoLIN N FlexPen ReliOn, dapagliflozin propanediol, and rosuvastatin.  Meds ordered this encounter  Medications  . montelukast (SINGULAIR) 10 MG tablet    Sig: Take 1 tablet (10 mg total) by mouth at bedtime.    Dispense:  90 tablet    Refill:  1    Problem List Items Addressed This Visit      Unprioritized   Diabetes mellitus type 2 in obese ([place]) (Chronic)    Per endo      DVT of lower extremity, bilateral ([place])    On xaralto      Hyperlipidemia associated with type 2 diabetes mellitus ([place]) - Primary     Encouraged heart healthy diet, increase exercise, avoid trans fats, consider a krill oil cap daily      Relevant Orders   Microalbumin / creatinine urine ratio   Lipid panel   Comprehensive metabolic panel   Pulmonary embolism (HCC)    On xaralto      Seasonal allergic rhinitis due to pollen   Relevant Medications   montelukast (SINGULAIR) 10 MG tablet    Other Visit Diagnoses    Need for pneumococcal vaccination       Relevant Orders   Pneumococcal polysaccharide vaccine 23-valent greater than or equal to 2yo subcutaneous/IM (Completed)   Primary hypertension       Relevant Orders   Microalbumin / creatinine urine ratio   Lipid panel   Comprehensive metabolic panel      Follow-up: Return if symptoms worsen or fail to improve.  Ann Held, DO

## 2020-02-28 ENCOUNTER — Other Ambulatory Visit: Payer: Self-pay | Admitting: Family Medicine

## 2020-02-28 ENCOUNTER — Other Ambulatory Visit: Payer: Self-pay | Admitting: Internal Medicine

## 2020-02-28 DIAGNOSIS — F419 Anxiety disorder, unspecified: Secondary | ICD-10-CM

## 2020-03-02 ENCOUNTER — Other Ambulatory Visit: Payer: Self-pay | Admitting: *Deleted

## 2020-03-02 MED ORDER — DAPAGLIFLOZIN PROPANEDIOL 5 MG PO TABS: ORAL_TABLET | ORAL | 3 refills | Status: DC

## 2020-03-10 ENCOUNTER — Telehealth: Payer: Self-pay | Admitting: Family Medicine

## 2020-03-10 ENCOUNTER — Other Ambulatory Visit: Payer: Self-pay

## 2020-03-10 MED ORDER — DAPAGLIFLOZIN PROPANEDIOL 10 MG PO TABS: ORAL_TABLET | ORAL | 1 refills | Status: DC

## 2020-03-10 MED ORDER — DAPAGLIFLOZIN PROPANEDIOL 5 MG PO TABS: ORAL_TABLET | ORAL | 3 refills | Status: DC

## 2020-03-10 NOTE — Telephone Encounter (Signed)
Medication sent to pharmacy  

## 2020-03-10 NOTE — Telephone Encounter (Signed)
Medication: dapagliflozin propanediol (FARXIGA) 5 MG TABS tablet [[id number]]   Has the patient contacted their pharmacy?  NO (If no, request that the patient contact the pharmacy for the refill.) (If yes, when and what did the pharmacy advise?)    Preferred Pharmacy (with phone number or street name): [facility] [address], [place] - [address]  [address] [place], [place] [address]  Phone:  [phone] Fax:  [phone]     Agent: Please be advised that RX refills may take up to 3 business days. We ask that you follow-up with your pharmacy.

## 2020-03-10 NOTE — Telephone Encounter (Signed)
Medication has been sent to pharmacy with correct dose.

## 2020-03-10 NOTE — Telephone Encounter (Signed)
Prescription was change by Dr.Shamleffer on 11/26/19. Patient is supposed to take two tablets a day (equal 10 MG). Patient would like a 90 day supply

## 2020-03-31 ENCOUNTER — Ambulatory Visit: Payer: Medicare Other | Admitting: Internal Medicine

## 2020-04-04 ENCOUNTER — Other Ambulatory Visit: Payer: Self-pay | Admitting: Family Medicine

## 2020-04-04 DIAGNOSIS — F419 Anxiety disorder, unspecified: Secondary | ICD-10-CM

## 2020-04-14 ENCOUNTER — Other Ambulatory Visit: Payer: Self-pay

## 2020-04-14 ENCOUNTER — Ambulatory Visit (INDEPENDENT_AMBULATORY_CARE_PROVIDER_SITE_OTHER): Payer: Medicare Other | Admitting: Internal Medicine

## 2020-04-14 ENCOUNTER — Encounter: Payer: Self-pay | Admitting: Internal Medicine

## 2020-04-14 VITALS — BP 116/72 | HR 86 | Ht 71.0 in | Wt 201.5 lb

## 2020-04-14 DIAGNOSIS — E1169 Type 2 diabetes mellitus with other specified complication: Secondary | ICD-10-CM

## 2020-04-14 DIAGNOSIS — E119 Type 2 diabetes mellitus without complications: Secondary | ICD-10-CM

## 2020-04-14 DIAGNOSIS — E1165 Type 2 diabetes mellitus with hyperglycemia: Secondary | ICD-10-CM

## 2020-04-14 LAB — POCT GLYCOSYLATED HEMOGLOBIN (HGB A1C): Hemoglobin A1C: 5.7 % — AB (ref 4.0–5.6)

## 2020-04-14 MED ORDER — DAPAGLIFLOZIN PROPANEDIOL 10 MG PO TABS: 10.0000 mg | ORAL_TABLET | Freq: Every day | ORAL | 3 refills | Status: AC

## 2020-04-14 NOTE — Progress Notes (Signed)
Name: Barbara Thomas  Age/ Sex: 69 y.o., female   MRN/ DOB: [id number], 1950/04/14     PCP: Ann Held, DO   Reason for Endocrinology Evaluation: Type 2 Diabetes Mellitus  Initial Endocrine Consultative Visit: 08/21/2019    PATIENT IDENTIFIER: Barbara Thomas is a 70 y.o. female with a past medical history of T2DM, OSA, GERD and Hx of DVT and PE. The patient has followed with Endocrinology clinic since 08/21/2019 for consultative assistance with management of her diabetes.  DIABETIC HISTORY:  Barbara Thomas was diagnosed with DM in 2019.She was initially started on insulin. No prior use of metformin in the past.  Her hemoglobin A1c has ranged from 6.9%  in 2019, peaking at 11.2% in 2021.  On her initial visit to our clinic , she had an A1c of 11.2 % She was on Novolin -N , we we added [place] to.  SUBJECTIVE:   During the last visit (11/26/2019): A1c 5.6 %. Decreased Novolin-N and increased [place]     Today (04/14/2020): Barbara Thomas is here for a follow up on diabetes.  She checks her blood sugars 2 times daily, preprandial to breakfast . The patient has not had hypoglycemic episodes since the last clinic visit.   Denies side effects to [place]  Stomach issues resolved , appetite improved    Moving to [place] this year   HOME DIABETES REGIMEN:  Novolin-N 10 units at bedtime Farxiga 10 mg daily     Statin: Yes ACE-I/ARB:Yes   GLUCOSE LOG:  BG  98- 144  mg/dL       DIABETIC COMPLICATIONS: Microvascular complications:   Denies: CKD, neuropathy Last Eye Exam: Completed not recent   Macrovascular complications:   Denies: CAD, CVA, PVD   HISTORY:  Past Medical History:  Past Medical History:  Diagnosis Date  . Allergy   . Arthritis   . Blood transfusion without reported diagnosis    1987  . Cancer (HCC)    Basal Cell Carcinoma  . Cataract   . Chronic kidney disease    kidney stone once  . DVT (deep venous thrombosis) ([place])   . H/O hiatal hernia   .  History of basal cell carcinoma excision    NOSE  . History of benign bladder tumor   . History of DVT of lower extremity    11/ 2011  BILATERAL  POST FOOT SURGERY  . History of pulmonary embolus (PE)    12/ 2011   POST FOOT SURGERY  . Hyperlipidemia   . Left ureteral calculus   . Migraines   . OSA on CPAP    STUDY DONE 2012  . PONV (postoperative nausea and vomiting)    severe  . Sigmoid diverticulosis   . Wears glasses    Past Surgical History:  Past Surgical History:  Procedure Laterality Date  . BENIGN RIGHT BREAST BX  12-06-2010  . COLONOSCOPY    . CYSTOSCOPY WITH RETROGRADE PYELOGRAM, URETEROSCOPY AND STENT PLACEMENT Left 11/25/2013   Procedure: CYSTOSCOPY WITH RETROGRADE PYELOGRAM, URETEROSCOPY AND STENT PLACEMENT WITH COLD CUP RESECTION OF BLADDER TUMOR ;  Surgeon: Bernestine Amass, MD;  Location: [facility];  Service: Urology;  Laterality: Left;  . HIP PINNING Left 1985  . HOLMIUM LASER APPLICATION Left 1/57/2620   Procedure: HOLMIUM LASER APPLICATION;  Surgeon: Bernestine Amass, MD;  Location: [facility];  Service: Urology;  Laterality: Left;  . INCONTINENCE SURGERY    . INSERTION OF VENA CAVA FILTER  12/  2011   ECLIPSE  . IVC VENOGRAPHY N/A 07/08/2019   Procedure: IVC Venography;  Surgeon: Waynetta Sandy, MD;  Location: [facility];  Service: Cardiovascular;  Laterality: N/A;  BIL LOWER LEGS WITH LYSIS  . ORIF LEFT ANKLE FX  11/ 2011  . PARTIAL HIP ARTHROPLASTY Left 1987  . REVISION HIP HEMIARTHROPLASTY  Left 2004  . THROMBECTOMY ILIAC ARTERY Bilateral 07/09/2019   Procedure: Bilateral ultrasound guided Cannulation of Internal Jugular,, Venogram External iliac,  common Femoral arteries, Angioplasty of Bilateral Common femoral and iliacs, ANGIOVAC WITH CIRC ARREST x 21 minutes, VENOUS THROMBECTOMY OF IVC, Iliacs and Femoral Vein WITH IVUS with Removal of IVC Filter;  Surgeon: Waynetta Sandy, MD;  Location: [facility];   Service: Vascular;  Laterality: Bil  . VAGINAL HYSTERECTOMY  2005   W/  BILATERAL SALPINGOOPHORECTOMY AND BLADDER SLING PROCEDURE   Social History:  reports that she has never smoked. She has never used smokeless tobacco. She reports that she does not drink alcohol and does not use drugs. Family History:  Family History  Problem Relation Age of Onset  . Transient ischemic attack Mother   . Hypertension Mother   . Dementia Mother   . Atrial fibrillation Mother   . Cancer Father        BLADDER CANCER  . Hypertension Father   . Stroke Paternal Grandmother   . Sudden death Maternal Grandmother   . Diabetes Brother   . Colon cancer Neg Hx   . Colon polyps Neg Hx   . Esophageal cancer Neg Hx   . Rectal cancer Neg Hx   . Stomach cancer Neg Hx   . Clotting disorder Neg Hx      HOME MEDICATIONS: Allergies as of 04/14/2020      Reactions   Penicillins Rash   Morphine And Related Nausea And Vomiting      Medication List       Accurate as of April 14, 2020 11:14 AM. If you have any questions, ask your nurse or doctor.        blood glucose meter kit and supplies Kit Dispense based on patient and insurance preference. Use up to four times daily as directed. (FOR ICD-9 250.00, 250.01).   CALCIUM PO Take 1,200 mg by mouth 2 (two) times daily.   cholecalciferol 1000 units tablet Commonly known as: VITAMIN D Take 1 tablet (1,000 Units total) by mouth daily. What changed:  how much to take when to take this   co-enzyme Q-10 30 MG capsule Take 100 mg by mouth daily.   dapagliflozin propanediol 10 MG Tabs tablet Commonly known as: Farxiga TAKE 1 TABLET BY MOUTH ONCE DAILY BEFORE BREAKFAST.   FISH OIL PO Take 1,000 mg by mouth daily.   glucosamine-chondroitin 500-400 MG tablet Take 1 tablet by mouth daily.   HYDROcodone-acetaminophen 5-325 MG tablet Commonly known as: NORCO/VICODIN Take 1 tablet by mouth every 4 (four) hours as needed.   montelukast 10 MG  tablet Commonly known as: SINGULAIR Take 1 tablet (10 mg total) by mouth at bedtime.   multivitamin tablet Take 1 tablet by mouth daily.   NovoLIN N FlexPen ReliOn 100 UNIT/ML Kiwkpen Generic drug: Insulin NPH (Human) (Isophane) Inject 10 Units into the skin at bedtime.   Pen Needles 32G X 5 MM Misc Use as directed with insulin pen   rivaroxaban 20 MG Tabs tablet Commonly known as: XARELTO Take 1 tablet (20 mg total) by mouth daily with supper.   rosuvastatin 10  MG tablet Commonly known as: CRESTOR Take 1 tablet (10 mg total) by mouth at bedtime.   sertraline 50 MG tablet Commonly known as: ZOLOFT Take 1 tablet by mouth once daily        OBJECTIVE:   Vital Signs: BP 116/72   Pulse 86   Ht '5\' 11"'  (1.803 m)   Wt 201 lb 8 oz (91.4 kg)   SpO2 98%   BMI 28.10 kg/m   Wt Readings from Last 3 Encounters:  04/14/20 201 lb 8 oz (91.4 kg)  02/27/20 208 lb (94.3 kg)  11/26/19 210 lb 9.6 oz (95.5 kg)     Exam: General: Pt appears well and is in NAD  Neck: General: Supple without adenopathy. Thyroid: Thyroid size normal.  No goiter or nodules appreciated. No thyroid bruit.  Lungs: Clear with good BS bilat with no rales, rhonchi, or wheezes  Heart: RRR with normal S1 and S2 and no gallops; no murmurs; no rub  Abdomen: Normoactive bowel sounds, soft, nontender, without masses or organomegaly palpable  Extremities: No pretibial edema.  Neuro: MS is good with appropriate affect, pt is alert and Ox3    DM Foot Exam 11/26/2019 The skin of the feet is intact without sores or ulcerations. The pedal pulses are 1+ on right and 1+ on left. The sensation is decreased to a screening 5.07, 10 gram monofilament on the right   DATA REVIEWED:  Lab Results  Component Value Date   HGBA1C 5.6 11/26/2019   HGBA1C 11.2 (H) 07/07/2019   HGBA1C 7.2 (H) 06/21/2018   Lab Results  Component Value Date   MICROALBUR <0.7 02/27/2020   LDLCALC 65 02/27/2020   CREATININE 0.84 02/27/2020    Lab Results  Component Value Date   MICRALBCREAT 0.7 02/27/2020     Lab Results  Component Value Date   CHOL 142 02/27/2020   HDL 57.60 02/27/2020   LDLCALC 65 02/27/2020   LDLDIRECT 102.0 08/27/2019   TRIG 93.0 02/27/2020   CHOLHDL 2 02/27/2020        Results for Duffell, Aubriee "JUDY" (MRN [id number]) as of 04/14/2020 11:15  Ref. Range 02/27/2020 10:24  Sodium Latest Ref Range: 135 - 145 mEq/L 139  Potassium Latest Ref Range: 3.5 - 5.1 mEq/L 4.2  Chloride Latest Ref Range: 96 - 112 mEq/L 103  CO2 Latest Ref Range: 19 - 32 mEq/L 29  Glucose Latest Ref Range: 70 - 99 mg/dL 84  BUN Latest Ref Range: 6 - 23 mg/dL 21  Creatinine Latest Ref Range: 0.40 - 1.20 mg/dL 0.84  Calcium Latest Ref Range: 8.4 - 10.5 mg/dL 9.3  Alkaline Phosphatase Latest Ref Range: 39 - 117 U/L 76  Albumin Latest Ref Range: 3.5 - 5.2 g/dL 4.3  AST Latest Ref Range: 0 - 37 U/L 17  ALT Latest Ref Range: 0 - 35 U/L 18  Total Protein Latest Ref Range: 6.0 - 8.3 g/dL 6.9  Total Bilirubin Latest Ref Range: 0.2 - 1.2 mg/dL 0.6  GFR Latest Ref Range: >60.00 mL/min 71.46     ASSESSMENT / PLAN / RECOMMENDATIONS:   1) Type 2 Diabetes Mellitus, Optimally controlled, Without complications - Most recent A1c of 5.7 %. Goal A1c < 7.0 %.      - A1c continues to be low despite gradually decreasing her insulin, we discussed switching insulin to an oral glycemic agent but she has plenty at home and would like to use them prior to making changes.  - We initially avoided all glycemic agents that  would cause GI side effects as she was having gastric issues due to abdominal hematoma ( metformin, GLp1 and DPP-4 inhibitors) - She is  moving to [place]  - She has amazing work with diet and medication compliance  - Will adjust medications as below   MEDICATIONS: Continue Farxiga 10 mg daily  Decrease Novolin-N to 8 units at bedtime   EDUCATION / INSTRUCTIONS: BG monitoring instructions: Patient is instructed to check her  blood sugars 2 times a day, fasting and bedtime . Call [facility] if: BG persistently < 70  I reviewed the Rule of 15 for the treatment of hypoglycemia in detail with the patient. Literature supplied.    2) Diabetic complications:  Eye: Does not have known diabetic retinopathy.  Neuro/ Feet: Does not have known diabetic peripheral neuropathy .  Renal: Patient does not have known baseline CKD.    F/U in 4 months    Signed electronically by: Mack Guise, MD  [facility]  [facility] [address] [place], [place] [address] Phone: [phone] FAX: [phone]   CC: [address] [place] [address] Phone: [phone]  Fax: [phone]  Return to Endocrinology clinic as below: No future appointments.

## 2020-04-14 NOTE — Patient Instructions (Signed)
-   Continue Farxiga 10 mg, daily  - Decrease Novolin-N to 8 units at bedtime        HOW TO TREAT LOW BLOOD SUGARS (Blood sugar LESS THAN 70 MG/DL)  Please follow the RULE OF 15 for the treatment of hypoglycemia treatment (when your (blood sugars are less than 70 mg/dL)    STEP 1: Take 15 grams of carbohydrates when your blood sugar is low, which includes:   3-4 GLUCOSE TABS  OR  3-4 OZ OF JUICE OR REGULAR SODA OR  ONE TUBE OF GLUCOSE GEL     STEP 2: RECHECK blood sugar in 15 MINUTES STEP 3: If your blood sugar is still low at the 15 minute recheck --> then, go back to STEP 1 and treat AGAIN with another 15 grams of carbohydrates.

## 2020-04-15 DIAGNOSIS — E119 Type 2 diabetes mellitus without complications: Secondary | ICD-10-CM | POA: Insufficient documentation

## 2020-06-05 ENCOUNTER — Other Ambulatory Visit: Payer: Self-pay

## 2020-06-05 MED ORDER — ROSUVASTATIN CALCIUM 10 MG PO TABS: 10.0000 mg | ORAL_TABLET | Freq: Every day | ORAL | 1 refills | Status: DC

## 2020-07-20 ENCOUNTER — Telehealth: Payer: Self-pay | Admitting: Family Medicine

## 2020-07-20 NOTE — Telephone Encounter (Signed)
New pharmacy  Medication: montelukast (SINGULAIR) 10 MG tablet [[id number]]   rivaroxaban (XARELTO) 20 MG TABS tablet [[id number]]    rosuvastatin (CRESTOR) 10 MG tablet [[id number]]   sertraline (ZOLOFT) 50 MG tablet [[id number]]   Has the patient contacted their pharmacy? no (If no, request that the patient contact the pharmacy for the refill.) (If yes, when and what did the pharmacy advise?) new pharmacy     Preferred Pharmacy (with phone number or street name):  [id number] - [place], [place] Phone:  [phone]  Fax:  [phone]          Agent: Please be advised that RX refills may take up to 3 business days. We ask that you follow-up with your pharmacy.

## 2020-07-21 ENCOUNTER — Other Ambulatory Visit: Payer: Self-pay

## 2020-07-21 DIAGNOSIS — F419 Anxiety disorder, unspecified: Secondary | ICD-10-CM

## 2020-07-21 DIAGNOSIS — J301 Allergic rhinitis due to pollen: Secondary | ICD-10-CM

## 2020-07-21 DIAGNOSIS — I2694 Multiple subsegmental pulmonary emboli without acute cor pulmonale: Secondary | ICD-10-CM

## 2020-07-21 MED ORDER — MONTELUKAST SODIUM 10 MG PO TABS: 10.0000 mg | ORAL_TABLET | Freq: Every day | ORAL | 1 refills | Status: AC

## 2020-07-21 MED ORDER — RIVAROXABAN 20 MG PO TABS: 20.0000 mg | ORAL_TABLET | Freq: Every day | ORAL | 3 refills | Status: AC

## 2020-07-21 MED ORDER — SERTRALINE HCL 50 MG PO TABS: 1.0000 | ORAL_TABLET | Freq: Every day | ORAL | 1 refills | Status: DC

## 2020-07-21 MED ORDER — ROSUVASTATIN CALCIUM 10 MG PO TABS: 10.0000 mg | ORAL_TABLET | Freq: Every day | ORAL | 1 refills | Status: AC

## 2020-07-21 NOTE — Telephone Encounter (Signed)
Refill sent.

## 2020-07-22 DIAGNOSIS — Z23 Encounter for immunization: Secondary | ICD-10-CM | POA: Diagnosis not present

## 2021-02-28 ENCOUNTER — Other Ambulatory Visit: Payer: Self-pay | Admitting: Family Medicine

## 2021-02-28 DIAGNOSIS — F419 Anxiety disorder, unspecified: Secondary | ICD-10-CM

## 2021-03-24 ENCOUNTER — Other Ambulatory Visit: Payer: Self-pay | Admitting: Family Medicine

## 2021-03-24 DIAGNOSIS — F419 Anxiety disorder, unspecified: Secondary | ICD-10-CM

## 2021-04-17 IMAGING — CT CT CTA ABD/PEL W/CM AND/OR W/O CM
4 of 9 series · 14 of 46 positions shown, 17 images · IV contrast (APPLIED)
Comparison: 11/21/2013

CLINICAL DATA: Leg vein DVT suspected

EXAM:
CTA ABDOMEN AND PELVIS WITHOUT AND WITH CONTRAST
TECHNIQUE: Multidetector CT imaging of the abdomen and pelvis was performed
using the standard protocol during bolus administration of
intravenous contrast. Multiplanar reconstructed images and MIPs were
obtained and reviewed to evaluate the vascular anatomy.
CONTRAST:  100mL OMNIPAQUE IOHEXOL 350 MG/ML SOLN

[Series 3: 70(person_name) (person_name) · axial · 0.93mm/px · z∈[+958,+1153]mm · 2 of 117 slices shown]
[im 39/117  soft-tissue]
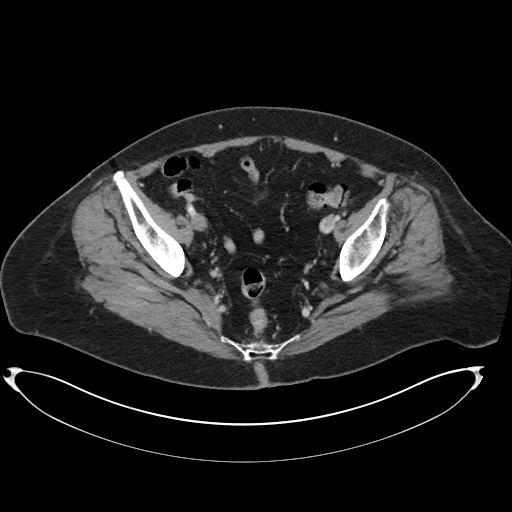
[im 78/117  soft-tissue]
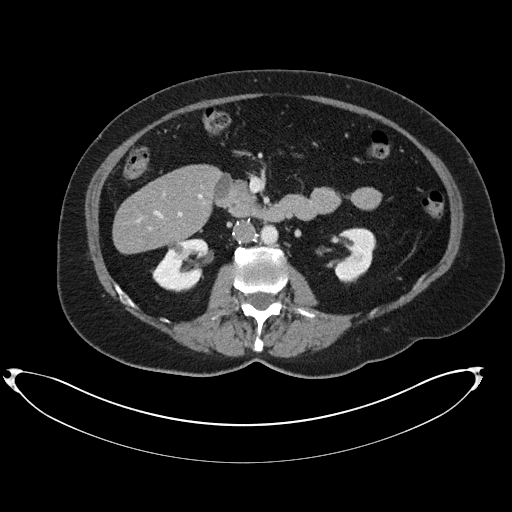

[Series 5: 70s lung · axial · 0.77mm/px · z∈[+1158,+1158]mm · 1 of 39 slices shown, 3 images]
[im 1/39  soft-tissue]
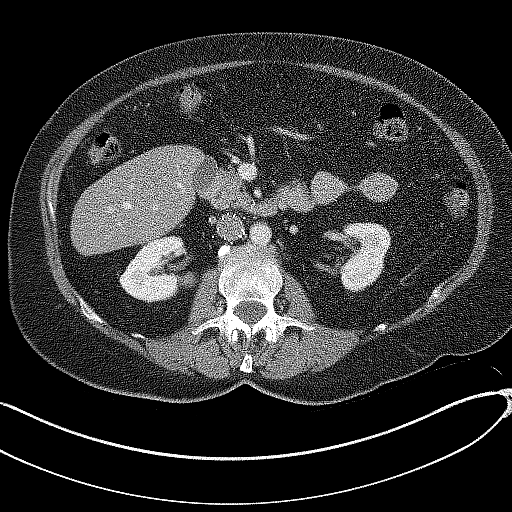
[im 1/39  lung]
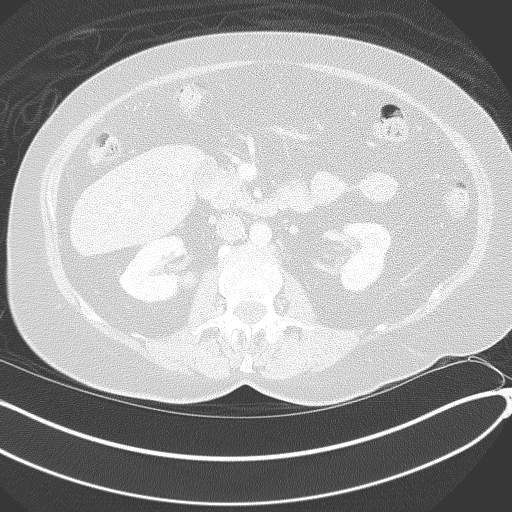
[im 1/39  bone]
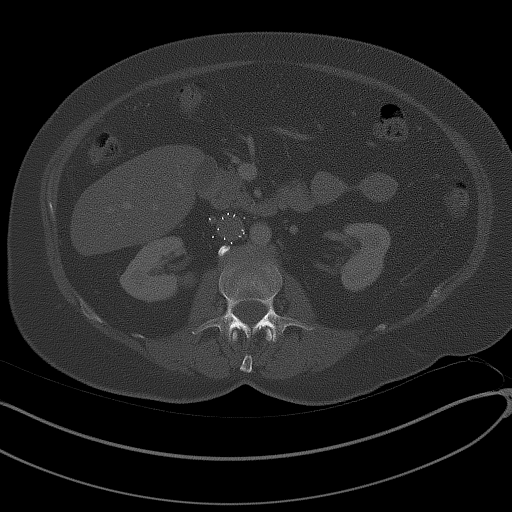

[Series 6: 70s cor soft tissue · coronal · 1.03mm/px · 3 of 110 slices shown, 4 images]
[im 28/110  soft-tissue]
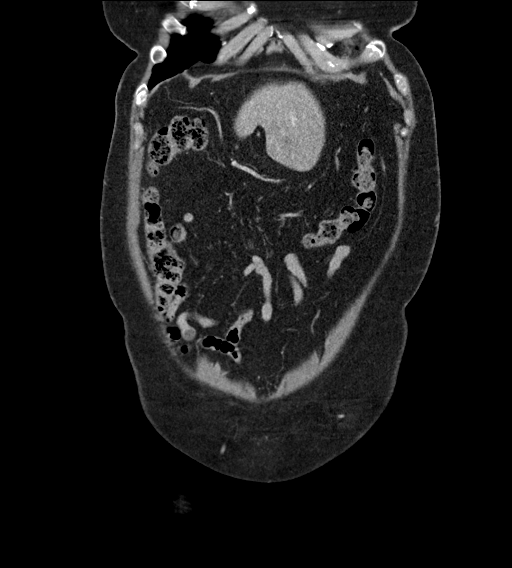
[im 55/110  soft-tissue]
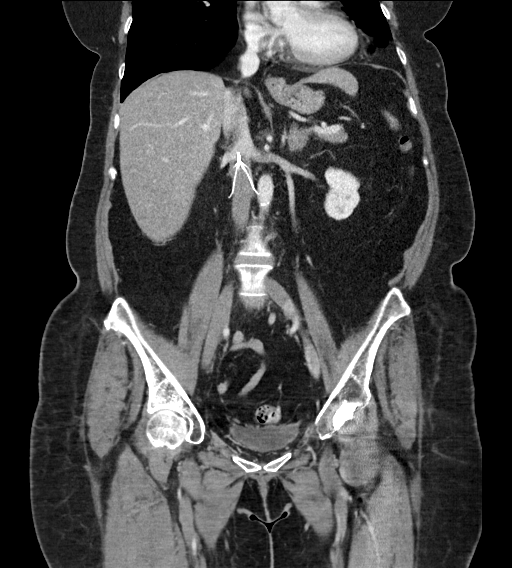
[im 55/110  bone]
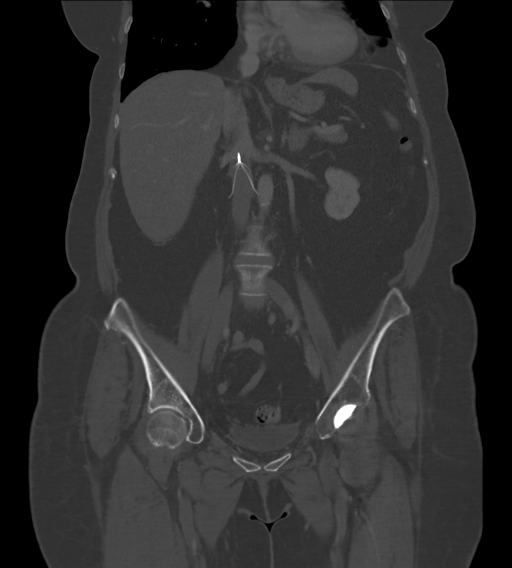
[im 82/110  soft-tissue]
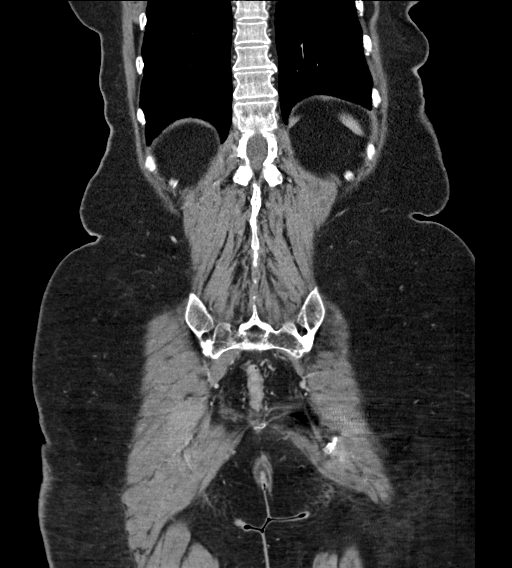

[Series 9: 120s thins (person_name) · axial · 0.93mm/px · z∈[+816,+1295]mm · 8 of 836 slices shown]
[im 76/836  soft-tissue]
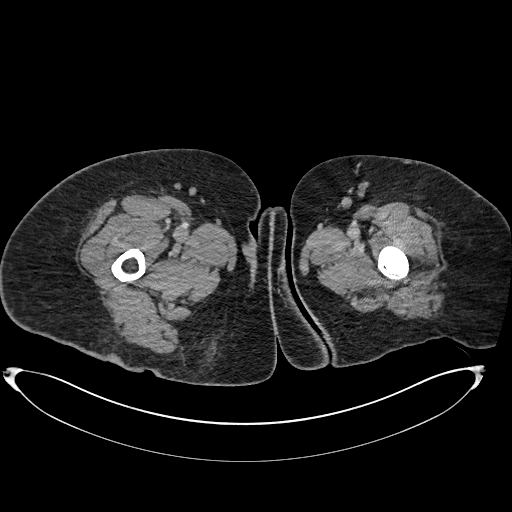
[im 152/836  soft-tissue]
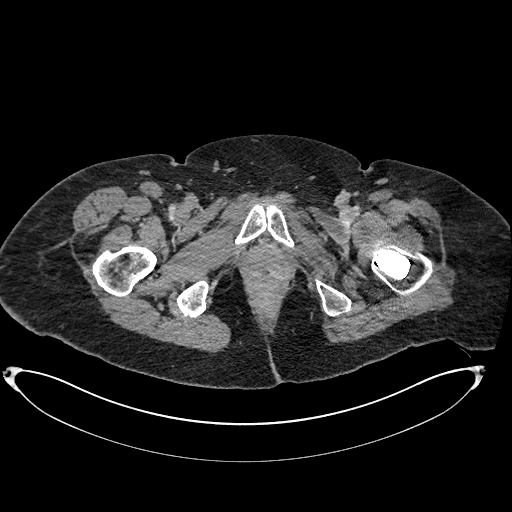
[im 266/836  soft-tissue]
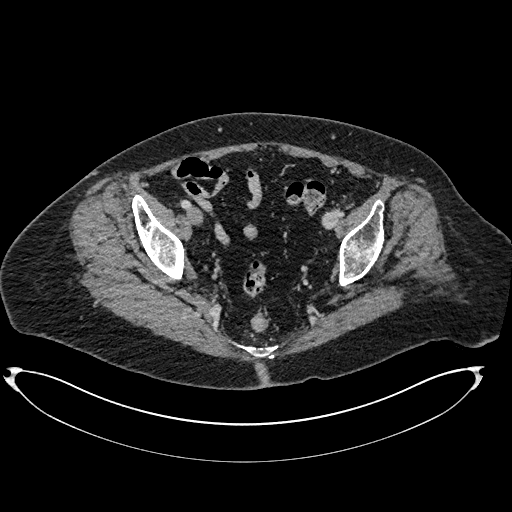
[im 380/836  soft-tissue]
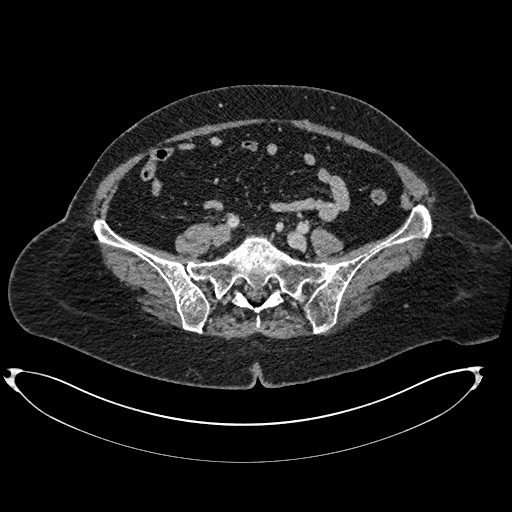
[im 456/836  soft-tissue]
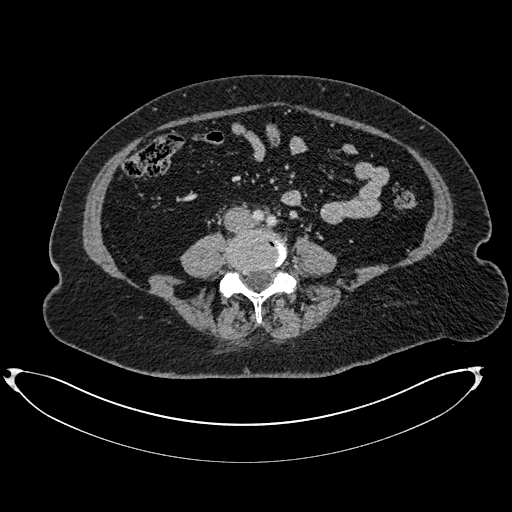
[im 570/836  soft-tissue]
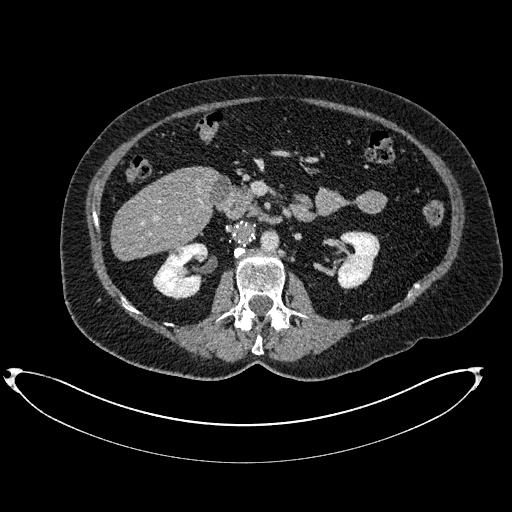
[im 684/836  soft-tissue]
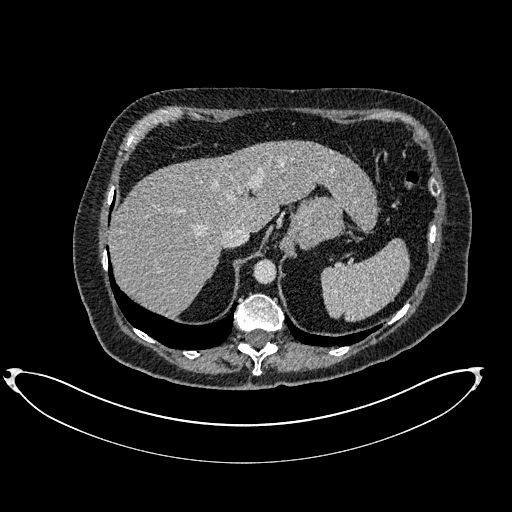
[im 760/836  soft-tissue]
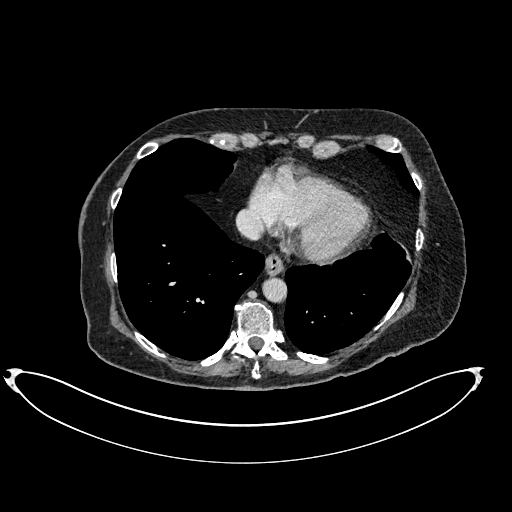

[14 of 46 positions shown; findings below may reference images not displayed]

FINDINGS: VASCULAR

Scattered aortic atherosclerosis. Normal contour and caliber of the
abdominal aorta. Standard branching pattern of the abdominal aorta
with solitary bilateral renal arteries.

There is a Recovery type IVC filter in the infrarenal IVC. The IVC
at the level of the filter and inferiorly and bilateral common iliac
veins, as well as included portions of the right internal and
external iliac and superior portions of the right femoral veins are
somewhat expansile appearing and non-opacified on 120 second delayed
phase imaging.

Review of the MIP images confirms the above findings.

NON-VASCULAR

Lower chest: No acute abnormality.

Hepatobiliary: No solid liver abnormality is seen. Hepatic
steatosis. No gallstones, gallbladder wall thickening, or biliary
dilatation.

Pancreas: Unremarkable. No pancreatic ductal dilatation or
surrounding inflammatory changes.

Spleen: Normal in size without significant abnormality.

Adrenals/Urinary Tract: Adrenal glands are unremarkable. Kidneys are
normal, without renal calculi, solid lesion, or hydronephrosis.
Bladder is unremarkable.

Stomach/Bowel: Stomach is within normal limits. Appendix appears
normal. No evidence of bowel wall thickening, distention, or
inflammatory changes.

Lymphatic:  No enlarged abdominal or pelvic lymph nodes.

Reproductive: Status post hysterectomy.

Other: No abdominal wall hernia or abnormality. No abdominopelvic
ascites.

Musculoskeletal: No acute or significant osseous findings.
IMPRESSION: 1. There is a Recovery type IVC filter in the infrarenal IVC. The
IVC at the level of the filter and inferiorly and bilateral common
iliac veins, as well as included portions of the right internal and
external iliac and superior portions of the right femoral veins are
somewhat expansile appearing and non-opacified on 120 second delayed
phase imaging. Findings are consistent with acute appearing central
venous and deep venous thrombosis of the IVC, bilateral common iliac
veins, and the included more distal right iliac and femoral veins.

2.  Hepatic steatosis.

3.  Aortic Atherosclerosis ([id number]).

## 2021-04-23 IMAGING — DX DG ABDOMEN 1V
1 series · 1 of 1 positions shown · non-contrast
Comparison: June 19, 2015

CLINICAL DATA: Abdominal pain

EXAM:
ABDOMEN - 1 VIEW

[abdomen kub]
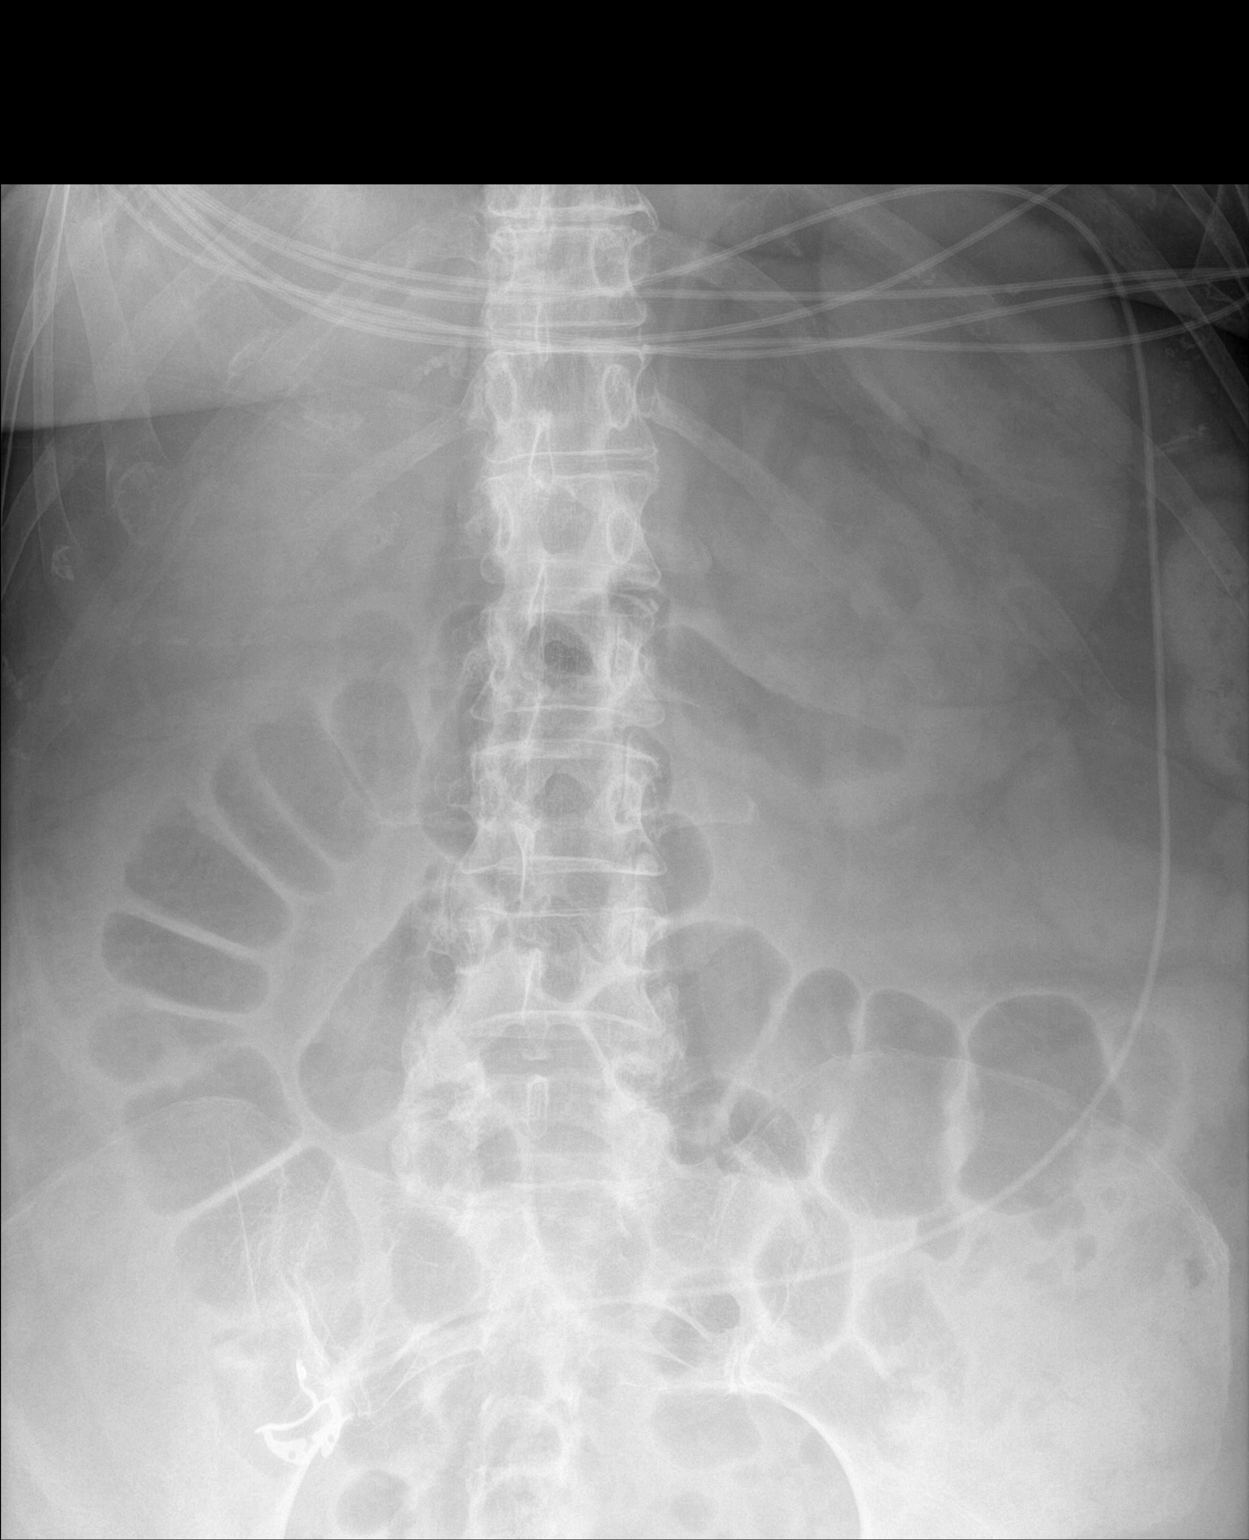

[1 of 1 positions shown; findings below may reference images not displayed]

FINDINGS: The bowel gas pattern is normal. No radio-opaque calculi or other
significant radiographic abnormality are seen. The previously noted
IVC filter has been removed.
IMPRESSION: Negative.

## 2021-04-24 ENCOUNTER — Other Ambulatory Visit: Payer: Self-pay | Admitting: Family Medicine

## 2021-04-24 DIAGNOSIS — F419 Anxiety disorder, unspecified: Secondary | ICD-10-CM

## 2021-12-31 ENCOUNTER — Encounter: Payer: Self-pay | Admitting: Internal Medicine
